# Patient Record
Sex: Male | Born: 1945 | Race: Black or African American | Hispanic: No | Marital: Married | Smoking: Former smoker
Health system: Southern US, Community
[De-identification: ages and names within clinical notes are randomized; demographics above are authoritative.]

## PROBLEM LIST (undated history)

## (undated) DIAGNOSIS — T8859XA Other complications of anesthesia, initial encounter: Secondary | ICD-10-CM

## (undated) DIAGNOSIS — I219 Acute myocardial infarction, unspecified: Secondary | ICD-10-CM

## (undated) DIAGNOSIS — E119 Type 2 diabetes mellitus without complications: Secondary | ICD-10-CM

## (undated) DIAGNOSIS — C61 Malignant neoplasm of prostate: Secondary | ICD-10-CM

## (undated) DIAGNOSIS — T4145XA Adverse effect of unspecified anesthetic, initial encounter: Secondary | ICD-10-CM

## (undated) DIAGNOSIS — I639 Cerebral infarction, unspecified: Secondary | ICD-10-CM

## (undated) DIAGNOSIS — E785 Hyperlipidemia, unspecified: Secondary | ICD-10-CM

## (undated) DIAGNOSIS — I10 Essential (primary) hypertension: Secondary | ICD-10-CM

## (undated) DIAGNOSIS — I200 Unstable angina: Secondary | ICD-10-CM

## (undated) DIAGNOSIS — B029 Zoster without complications: Secondary | ICD-10-CM

## (undated) DIAGNOSIS — I169 Hypertensive crisis, unspecified: Secondary | ICD-10-CM

## (undated) DIAGNOSIS — M4800 Spinal stenosis, site unspecified: Secondary | ICD-10-CM

## (undated) DIAGNOSIS — I2510 Atherosclerotic heart disease of native coronary artery without angina pectoris: Secondary | ICD-10-CM

## (undated) DIAGNOSIS — M1990 Unspecified osteoarthritis, unspecified site: Secondary | ICD-10-CM

## (undated) DIAGNOSIS — IMO0002 Reserved for concepts with insufficient information to code with codable children: Secondary | ICD-10-CM

## (undated) HISTORY — PX: CARDIAC SURGERY: SHX584

## (undated) HISTORY — DX: Spinal stenosis, site unspecified: M48.00

## (undated) HISTORY — DX: Hyperlipidemia, unspecified: E78.5

## (undated) HISTORY — DX: Reserved for concepts with insufficient information to code with codable children: IMO0002

## (undated) HISTORY — DX: Malignant neoplasm of prostate: C61

---

## 2000-11-20 ENCOUNTER — Encounter (INDEPENDENT_AMBULATORY_CARE_PROVIDER_SITE_OTHER): Payer: Self-pay | Admitting: Specialist

## 2000-11-20 ENCOUNTER — Ambulatory Visit (HOSPITAL_COMMUNITY): Admission: RE | Admit: 2000-11-20 | Discharge: 2000-11-20 | Payer: Self-pay | Admitting: Gastroenterology

## 2003-10-15 ENCOUNTER — Emergency Department (HOSPITAL_COMMUNITY): Admission: EM | Admit: 2003-10-15 | Discharge: 2003-10-15 | Payer: Self-pay | Admitting: Emergency Medicine

## 2006-07-10 ENCOUNTER — Emergency Department (HOSPITAL_COMMUNITY): Admission: EM | Admit: 2006-07-10 | Discharge: 2006-07-10 | Payer: Self-pay | Admitting: Family Medicine

## 2006-08-09 ENCOUNTER — Emergency Department (HOSPITAL_COMMUNITY): Admission: EM | Admit: 2006-08-09 | Discharge: 2006-08-09 | Payer: Self-pay | Admitting: Family Medicine

## 2007-09-11 HISTORY — PX: CORONARY ARTERY BYPASS GRAFT: SHX141

## 2008-05-16 ENCOUNTER — Inpatient Hospital Stay (HOSPITAL_COMMUNITY): Admission: EM | Admit: 2008-05-16 | Discharge: 2008-05-24 | Payer: Self-pay | Admitting: Emergency Medicine

## 2008-05-18 ENCOUNTER — Encounter: Payer: Self-pay | Admitting: Thoracic Surgery (Cardiothoracic Vascular Surgery)

## 2008-05-19 ENCOUNTER — Encounter: Payer: Self-pay | Admitting: Thoracic Surgery (Cardiothoracic Vascular Surgery)

## 2008-05-20 ENCOUNTER — Ambulatory Visit: Payer: Self-pay | Admitting: Thoracic Surgery (Cardiothoracic Vascular Surgery)

## 2008-05-31 ENCOUNTER — Ambulatory Visit: Payer: Self-pay | Admitting: Thoracic Surgery (Cardiothoracic Vascular Surgery)

## 2008-06-14 ENCOUNTER — Encounter
Admission: RE | Admit: 2008-06-14 | Discharge: 2008-06-14 | Payer: Self-pay | Admitting: Thoracic Surgery (Cardiothoracic Vascular Surgery)

## 2008-06-14 ENCOUNTER — Ambulatory Visit: Payer: Self-pay | Admitting: Thoracic Surgery (Cardiothoracic Vascular Surgery)

## 2008-09-08 HISTORY — PX: OTHER SURGICAL HISTORY: SHX169

## 2009-08-09 IMAGING — CR DG CHEST 1V PORT
1 series · 1 of 1 positions shown · non-contrast
Comparison: 05/15/2008

CLINICAL DATA: Coronary bypass grafting

PORTABLE CHEST - 1 VIEW

[AP]
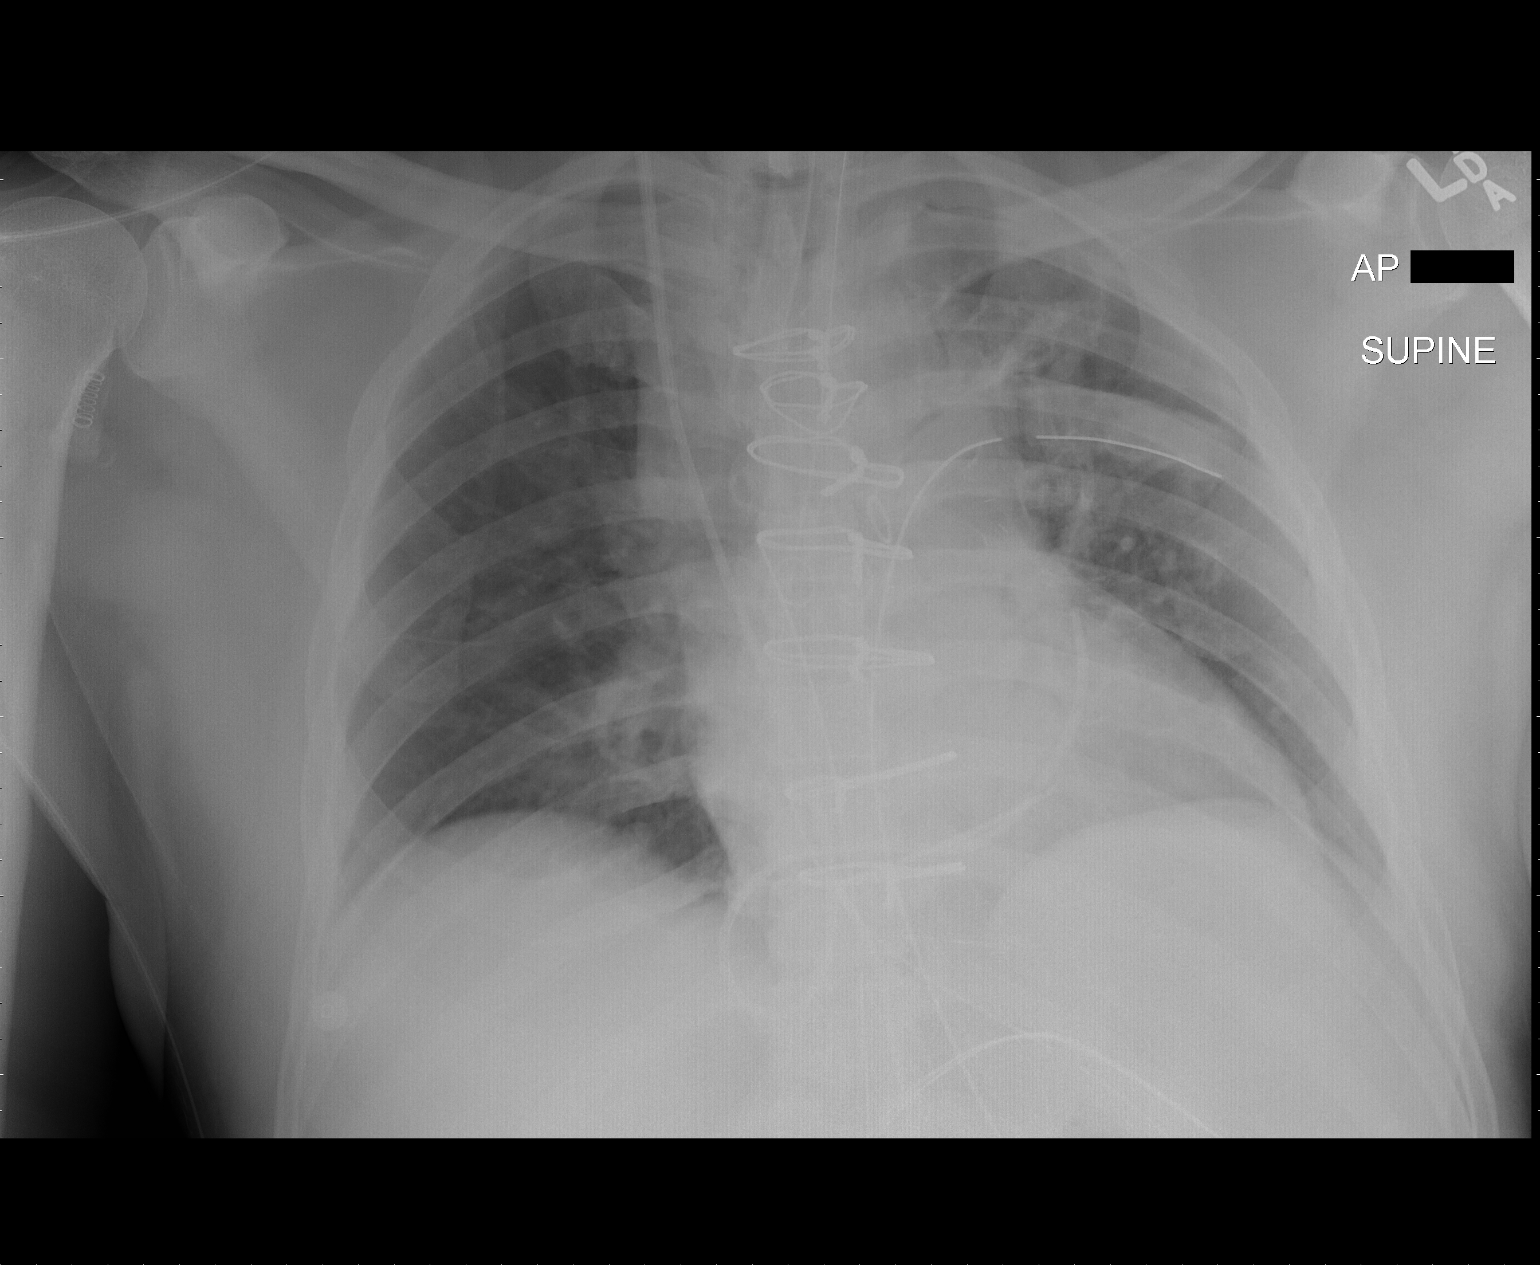

[1 of 1 positions shown; findings below may reference images not displayed]

FINDINGS: Changes of interval CABG.  Endotracheal tube tip
approximately 3 cm above carina.  Nasogastric tube extends at least
as far as the stomach.  Left chest tube with no pneumothorax.
Mediastinal drain is noted.  Right IJ central line to the
cavoatrial junction.  Low lung volumes with mild interstitial edema
or infiltrates and central pulmonary vascular congestion.  Patchy
areas of atelectasis or infiltrate in the perihilar regions, left
greater than right.  No definite effusion.
IMPRESSION: 1.  Recent CABG with support hardware projecting expected location.
2.  No pneumothorax.
3.  Mild asymmetric perihilar infiltrates or edema, left greater
than right.

## 2009-08-10 IMAGING — CR DG CHEST 1V PORT
1 series · 1 of 1 positions shown · non-contrast
Comparison: 05/20/2008

CLINICAL DATA: CABG.

PORTABLE CHEST - 1 VIEW

[AP]
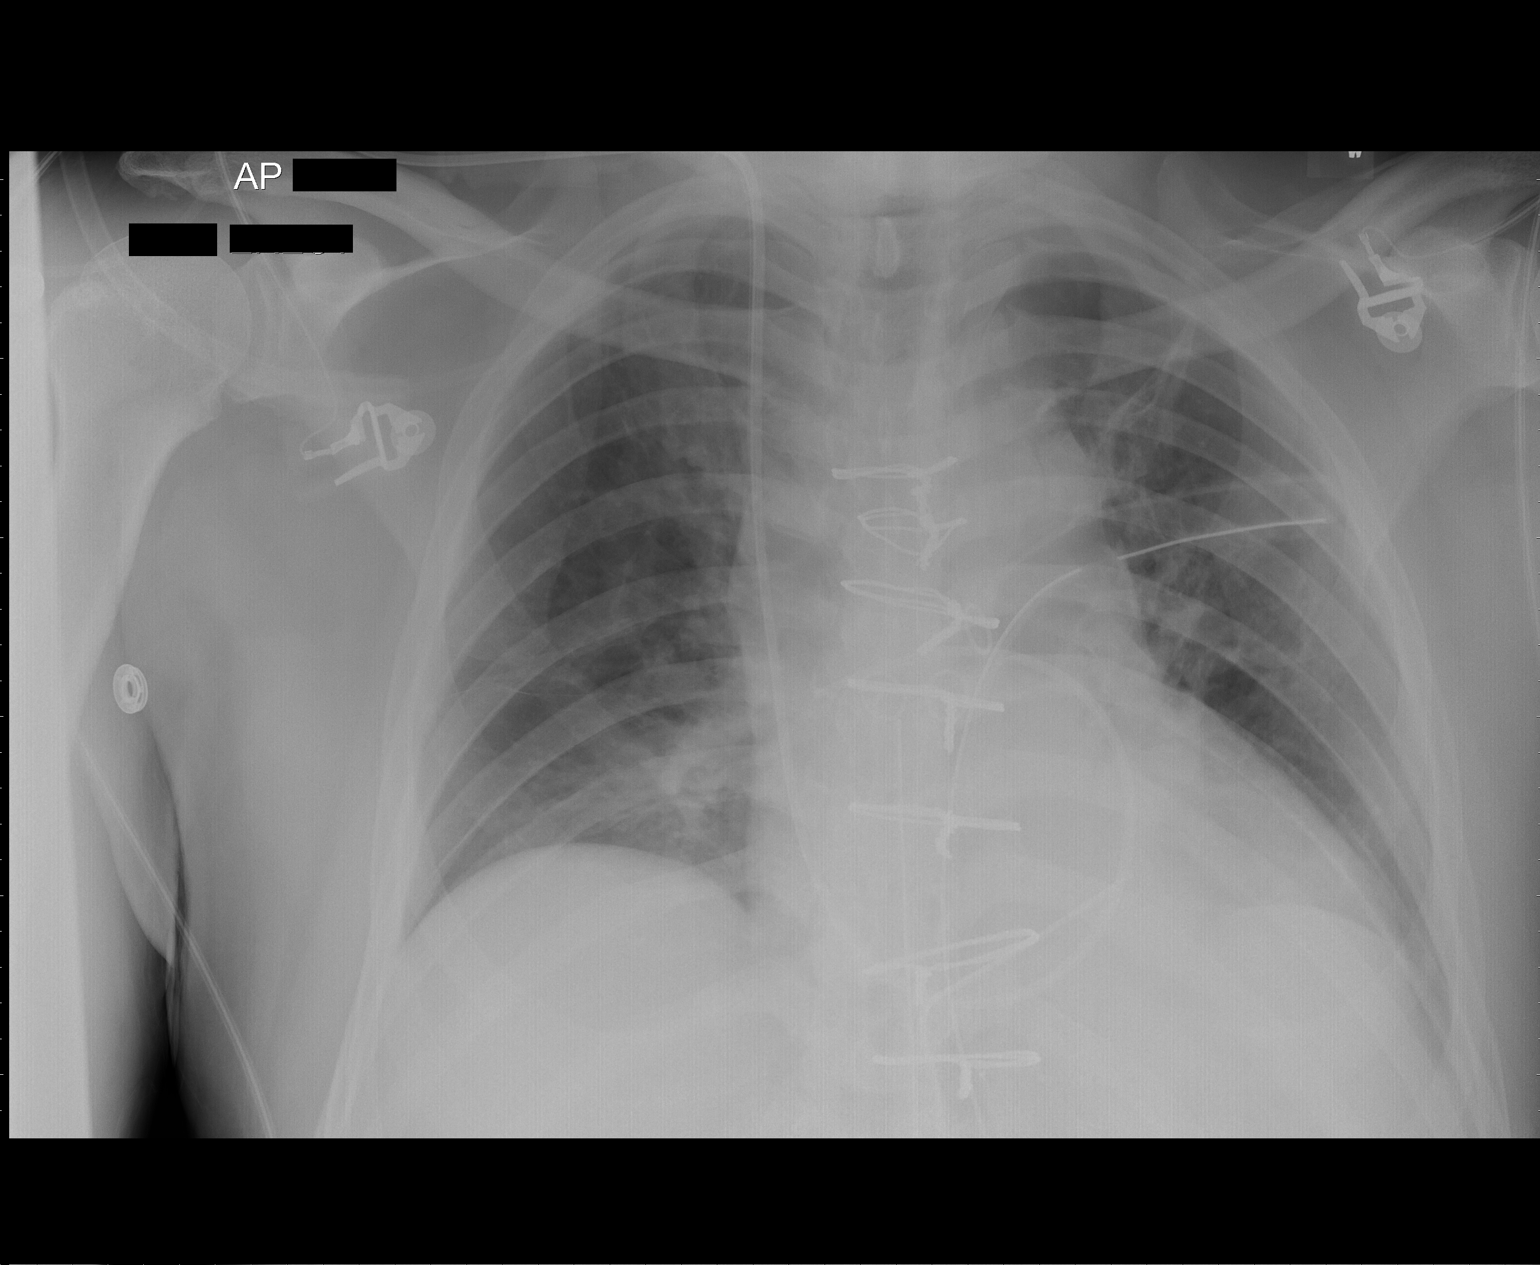

[1 of 1 positions shown; findings below may reference images not displayed]

FINDINGS: Swan-Ganz catheter has been advanced into the main right
pulmonary artery.  Interval extubation and removal of NG tube.
Left chest tube unchanged.  No pneumothorax.  Left upper lobe and
bibasilar atelectasis noted.  The bibasilar atelectasis has
increased slightly since prior study.  Mild cardiomegaly with
vascular congestion.
IMPRESSION: Increasing bibasilar atelectasis post extubation.  Stable left
upper lobe atelectasis.

Cardiomegaly, vascular congestion.

## 2009-08-11 IMAGING — CR DG CHEST 2V
2 series · 2 of 2 positions shown · non-contrast
Comparison: 05/21/2008

CLINICAL DATA: Followup CABG procedure

CHEST - 2 VIEW

[w chest pa]
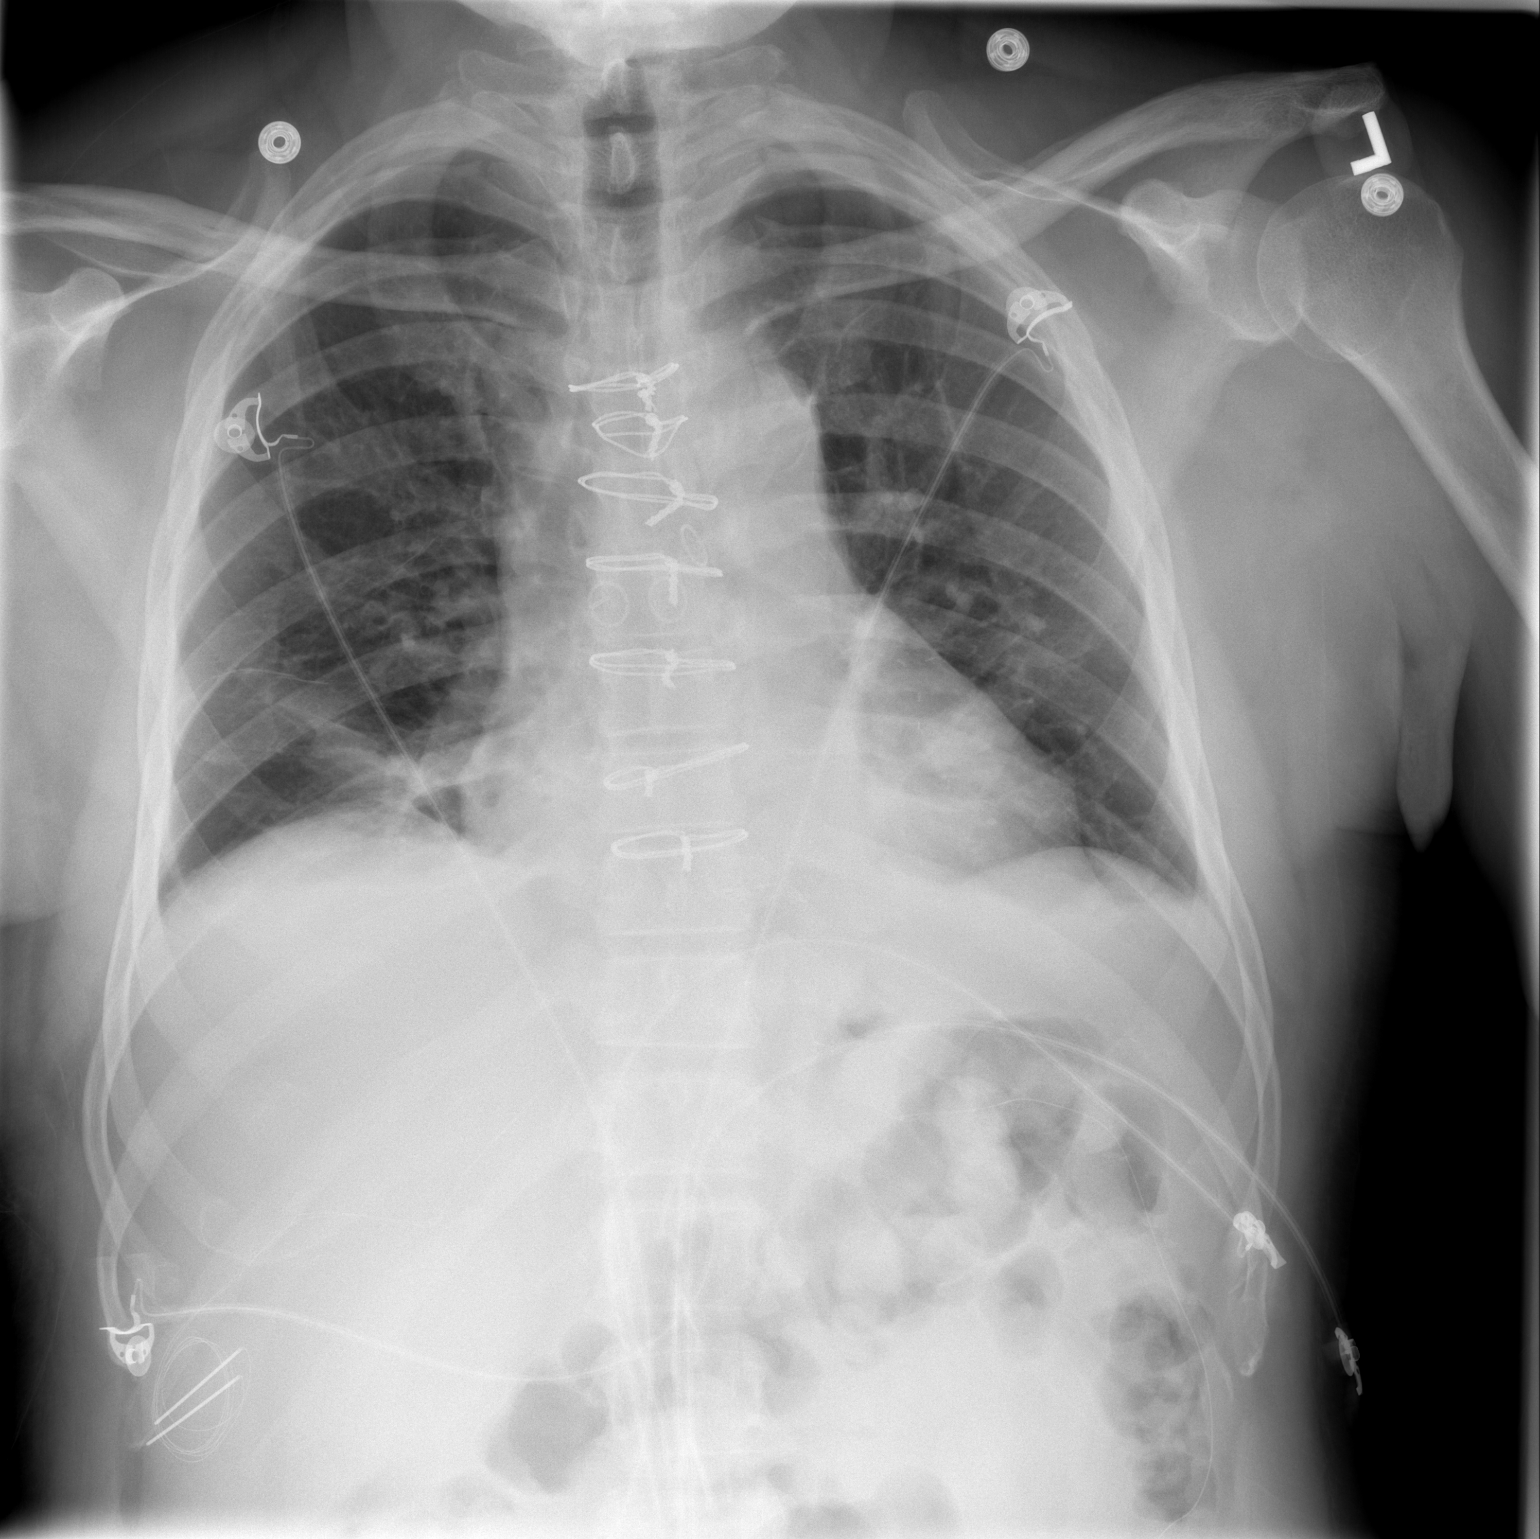

[w chest lat]
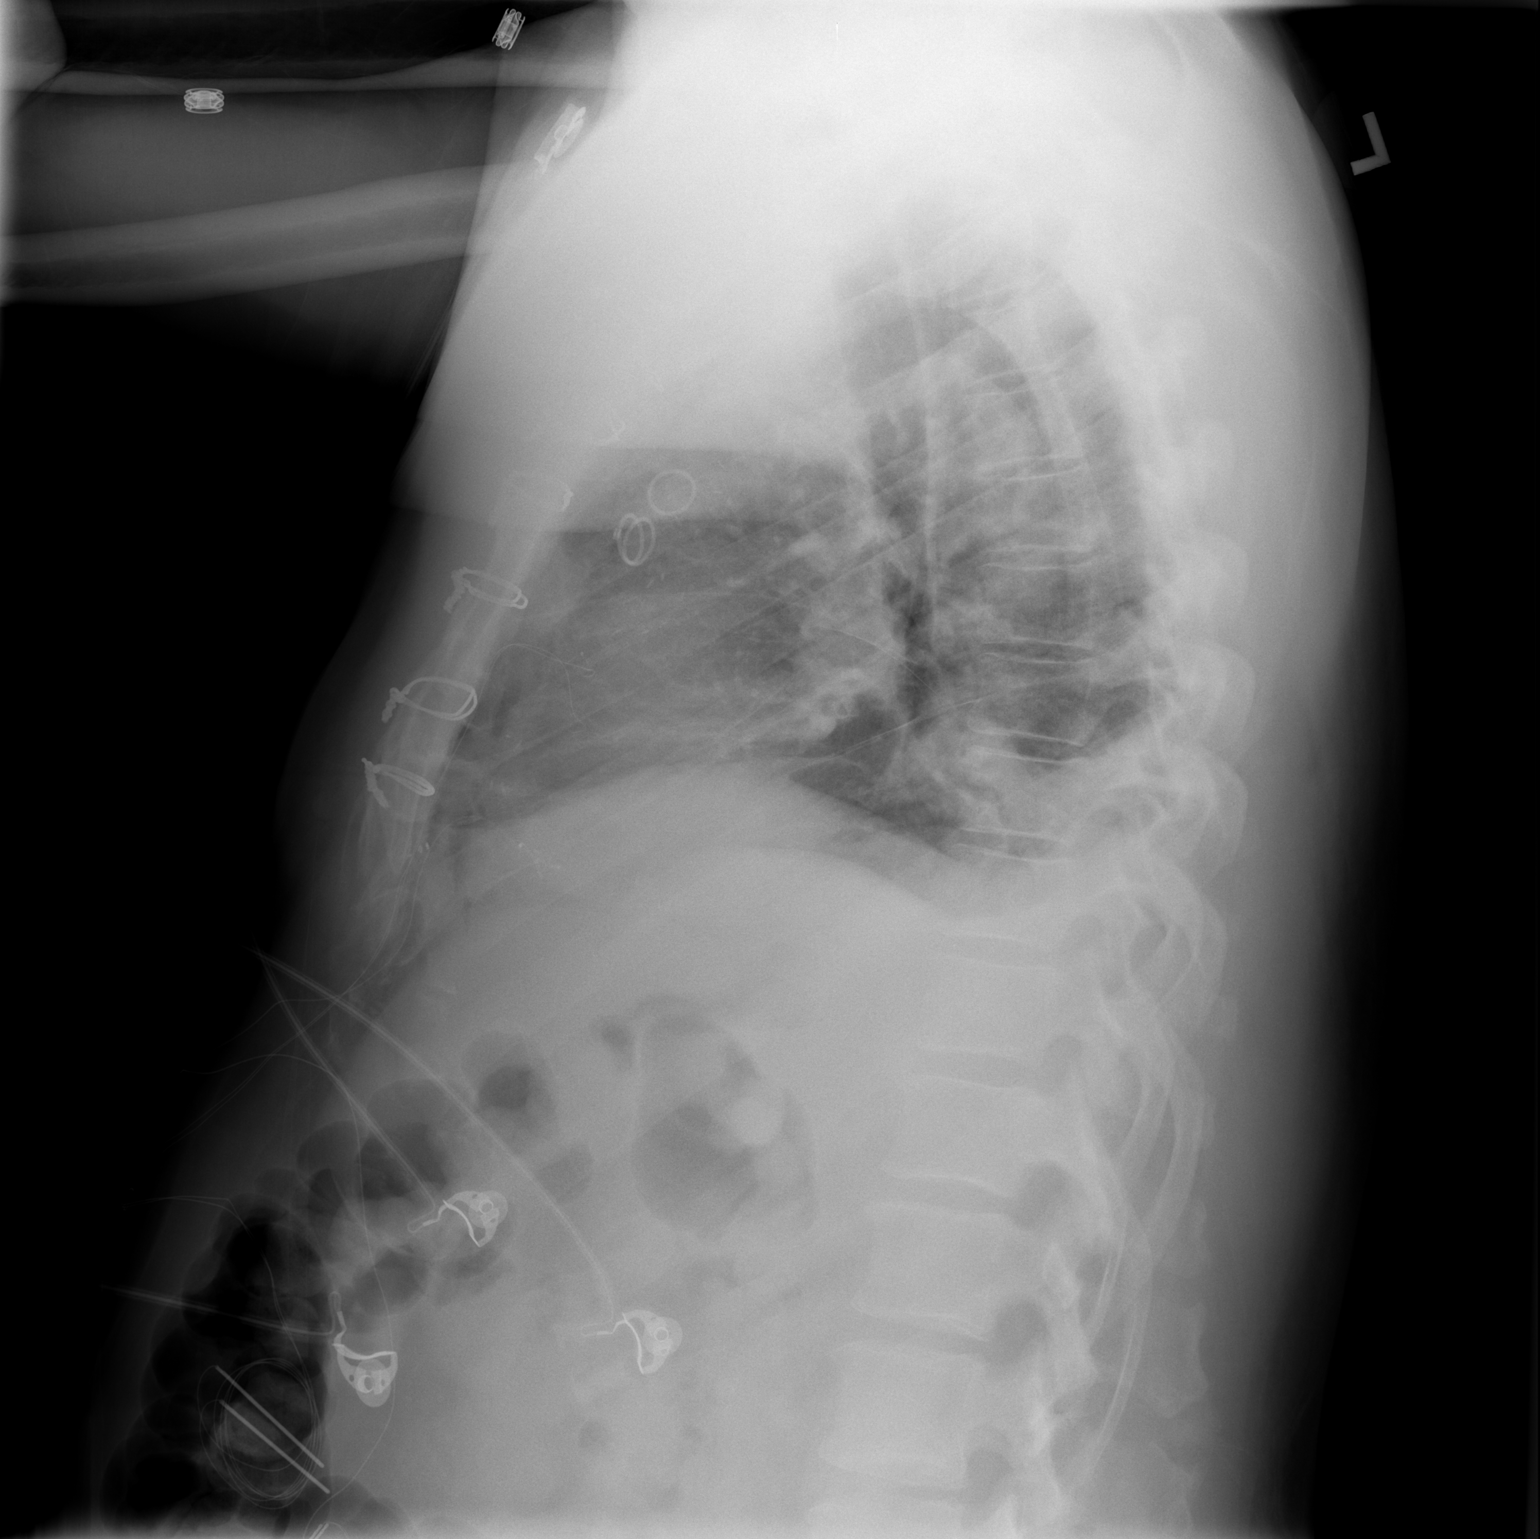

[2 of 2 positions shown; findings below may reference images not displayed]

FINDINGS: Swan-Ganz catheter, mediastinal drains and left chest
tube have all been removed in the interval.  Negative for
pneumothorax.  Bibasilar subsegmental atelectasis.  Negative for
edema.  Cardiomediastinal contours are unchanged.
IMPRESSION: 1.  Interval removal Swan-Ganz catheter, chest tube and mediastinal
drains.  Negative for pneumothorax.
2.  Bibasilar subsegmental atelectasis.

## 2011-01-23 NOTE — Cardiovascular Report (Signed)
[name], [name] NO.:  [id number]   MEDICAL RECORD NO.:  [id number]          PATIENT TYPE:  INP   LOCATION:  2017                         FACILITY:  [facility]   PHYSICIAN:  Shelva Majestic, M.D.     DATE OF BIRTH:  29-Nov-1945   DATE OF PROCEDURE:  05/18/2008  DATE OF DISCHARGE:  05/24/2008                            CARDIAC CATHETERIZATION   INDICATIONS:  [name] is a 65 year old African American male  who has a history of hypertension, hyperlipidemia, type 2 diabetes  mellitus, and family history of coronary artery disease.  He presented  to [facility] on May 15, 2008, with chest pain.  He had ST-  T changes suggestive of acute coronary syndrome.  Urgent cardiac  catheterization was performed which demonstrated subtotal/total diagonal  occlusion, but he had significant thrombus burden in the proximal LAD as  well as thrombus burden in the proximal circumflex.  In addition, he did  have diffuse 80% stenosis in the second diagonal portion of the LAD, 70%  and 50% mid-LAD stenoses, 95% distal LAD stenosis, as well as high-grade  proximal 95% stenosis.  The patient has been maintained on Integrilin  and heparin and in addition nitroglycerin since presentation on early  morning of May 16, 2008.  He is now brought back to the  catheterization laboratory for re-look to if there is significant  resolution of thrombus burden with plans for possible percutaneous  coronary prevention versus CABG revascularization surgery.   PROCEDURE:  After premedication with Valium 5 mg, the patient was  prepped and draped in usual fashion.  Right femoral artery was punctured  anteriorly and a 6-French sheath was inserted.  Diagnostic  catheterization was done utilizing Judkins for left and right coronary  catheters.  A 5-French pigtail catheter was used for biplane left  ventriculography.  Hemostasis was obtained by direct manual pressure.  The patient  tolerated the procedure well.   HEMODYNAMIC DATA:  Central aortic pressure is 122/82.  Left ventricular  pressure 122/14.   ANGIOGRAPHIC DATA:  Left main coronary was angiographically normal and  bifurcated into the LAD and left circumflex system.   The LAD had evidence for proximal 50-60% narrowing before the takeoff of  the first diagonal vessel.  There was significant improvement in the  previous large clot burden in this segment.  The diagonal vessel now  appeared totally occluded proximally with very faint antegrade  collateralization.  There was diffuse 70% and 50% LAD stenoses before  the second diagonal vessel, diffuse 80% stenosis within the second  diagonal vessel, and 50% stenosis in the mid LAD after the second  diagonal vessel.  The apical LAD had TIMI III flow and had 90% stenosis.   The circumflex vessel gave rise to one major marginal vessel and there  was still thrombus burden in the proximal segment.   The right coronary artery had 95% proximal stenosis with diffuse 20-30%  narrowings in the mid distal segment.   Biplane left ventriculography revealed ejection fraction of 55%.  There  was only mild residual distal inferior hypocontractility in the RAO  projection  and low posterolateral hypocontractility on the LAO  projection.   IMPRESSION:  1. Preserved global contractility with mild residual inferoapical to      low posterolateral hypocontractility.  2. Multivessel coronary obstructive disease with evidence for 50-60%      proximal left anterior descending stenosis, total occlusion of the      first diagonal vessel, diffuse 70% and 50% mid left anterior      descending stenosis of 90% apical left anterior descending stenosis      and diffuse 80% stenosis in the second diagonal vessel; clot burden      in the proximal circumflex coronary artery; 95% proximal left      anterior descending stenosis with mid distal 20-30% narrowing.   DISCUSSION:  [name]  presented with acute coronary syndrome on the  evening of May 15, 2008.  He was taken to the catheterization  laboratory where acute catheterization reveals significant thrombus  burden in proximal LAD and circumflex.  At that time, his ST-T changes  were most likely due to his high-grade subtotal diagonal vessel.  He has  been maintained on Integrilin and heparin in addition to nitrate therapy  since presentation.  Catheterization study continues now shows some  improvement in the previous clot burden but still this is significant in  the circumflex territory.  With his age of 69 years, diabetes mellitus,  and diffuse coronary artery disease, coronary artery bypass graft  revascularization surgery is recommended.           ______________________________  Shelva Majestic, M.D.     TK/MEDQ  D:  07/12/2008  T:  07/13/2008  Job:  [id number]   cc:   Judeth Cornfield. Scot Dock, M.D.

## 2011-01-23 NOTE — Op Note (Signed)
[name], [name]             ACCOUNT NO.:  [id number]   MEDICAL RECORD NO.:  [id number]          PATIENT TYPE:  INP   LOCATION:  2304                         FACILITY:  [facility]   PHYSICIAN:  Valentina Gu. Roxy Manns, M.D. DATE OF BIRTH:  1946-09-03   DATE OF PROCEDURE:  05/20/2008  DATE OF DISCHARGE:                               OPERATIVE REPORT   PREOPERATIVE DIAGNOSIS:  Severe three-vessel coronary artery disease.   POSTOPERATIVE DIAGNOSIS:  Severe three-vessel coronary artery disease.   PROCEDURE:  Median sternotomy for coronary artery bypass grafting x4  (left internal mammary artery to distal left anterior descending  coronary artery, saphenous vein graft to second diagonal branch,  saphenous vein graft to circumflex marginal branch, saphenous vein graft  to distal right coronary artery, endoscopic saphenous vein harvest from  right thigh and right lower leg).   SURGEON:  Valentina Gu. Roxy Manns, MD   ASSISTANT:  Lars Pinks, PA   ANESTHESIA:  General.   BRIEF CLINICAL NOTE:  The patient is a 65 year old male with no previous  history of coronary artery disease, but risk factors notable for history  of hypertension, type 2 diabetes mellitus, and hyperlipidemia.  The  patient presents with acute coronary syndrome and was admitted to the  hospital on May 16, 2008.  The patient ruled in for an acute non-ST  segment elevation myocardial infarction based on serial cardiac enzymes.  Cardiac catheterization performed by Dr. Ellouise Newer demonstrates severe  three-vessel coronary artery disease with preserved left ventricular  function.  A full consultation note has been dictated previously.  The  patient and his family have been counseled at length regarding the  indications, risks, and potential benefits of surgery.  They provide  consent for the procedure as described.   OPERATIVE FINDINGS:  1. Normal left ventricular systolic function.  2. Mild left ventricular  hypertrophy.  3. Diffuse coronary artery disease.  4. Small caliber left internal mammary artery with good flow.  5. Good-quality saphenous vein conduit for grafting.   OPERATIVE NOTE IN DETAIL:  The patient is brought to the operating room  on the above-mentioned date and central monitoring was established by  the anesthesia service under the care and direction of Dr. Annye Asa.  Specifically, a Swan-Ganz catheter is placed through the right  internal jugular approach.  A radial arterial line is placed.  Intravenous antibiotics are administered.  Following induction with  general endotracheal anesthesia, a Foley catheter is placed.  The  patient's chest, abdomen, both groins, and both lower extremities are  prepared and draped in sterile manner.  Baseline transesophageal  echocardiogram is performed by Dr. Glennon Mac.  This demonstrates normal  left ventricular function.  No other significant abnormalities are  noted.   A median sternotomy incision is performed and the left internal mammary  artery is dissected from the chest wall and prepared for bypass  grafting.  The left internal mammary artery is somewhat small caliber,  but otherwise good-quality conduit.  Simultaneously, saphenous vein is  obtained at the patient's right thigh and the upper portion of the right  lower leg using endoscopic  vein harvest technique.  The saphenous vein  is good-quality conduit.  After the saphenous vein has been removed from  the right lower extremity, the small incisions are closed in multiple  layers with running absorbable suture.  The patient is heparinized  systemically and left internal mammary artery is transected distally.  It is noted to have excellent flow.   The pericardium is opened.  The ascending aorta is normal in appearance.  The ascending aorta and the right atrium are cannulated for  cardiopulmonary bypass.  Adequate heparinization is verified.  Cardiopulmonary bypass is  begun and the surface of the heart is  inspected.  Distal target vessels are selected for coronary bypass  grafting.  A temperature probe is placed in the left ventricular septum  and a cardioplegic catheter is placed in the ascending aorta.   Of note, the first diagonal branch of the left anterior descending  coronary artery is too small for grafting.   The patient is allowed to cool passively to 32 degrees systemic  temperature.  The aortic cross-clamp is applied and cold blood  cardioplegia is administered in antegrade fashion through the aortic  root.  Iced saline slush is applied for topical hypothermia.  The  initial cardioplegic arrest and myocardial cooling is felt to be  excellent.  Repeat doses of cardioplegia are administered intermittently  throughout the cross-clamp portion of the operation through the aortic  root and down the subsequently placed vein graft to maintain left  ventricular septal temperature below 15 degrees centigrade.   The following distal coronary anastomoses are performed:  1. The circumflex marginal branch is grafted with a saphenous vein      graft in end-to-side fashion.  This vessel measured 1.8-mm in      diameter and is a good-quality target vessel for grafting.  2. The distal right coronary artery is grafted with a saphenous vein      graft in end-to-side fashion.  This anastomoses is placed precisely      at the area of the bifurcation of the distal right coronary artery.      At the site of distal grafting, this vessel measured 2.0-mm in      diameter and is a good-quality target vessel.  3. The second diagonal branch of the left anterior descending coronary      artery is grafted with a saphenous vein graft in end-to-side      fashion.  This vessel measured 1.2-mm in diameter and is a fair-      quality target vessel for grafting.  It is diffusely diseased      proximally.  4. The distal left anterior descending coronary artery is grafted  with      left internal mammary artery in end-to-side fashion.  This vessel      was diffusely diseased and there is high-grade stenosis well beyond      the distal anastomoses close to the apex of the heart.  However,      the site of distal grafting.  This vessel measures 2.0-mm in      diameter and is a good-quality target vessel.   All three proximal saphenous vein anastomoses are performed directly to  the ascending aorta prior to removal of the aortic cross-clamp.  In the  left ventricular, septal temperature rises rapidly with reperfusion of  the left internal mammary artery.  The aortic cross-clamp is removed  after total cross-clamp time of 76 minutes.  The heart is defibrillated  and normal sinus rhythm resumes spontaneously.  All proximal and distal  coronary anastomoses are inspected for hemostasis and appropriate graft  orientation.  The epicardial pacing wires are fixed to the right  ventricular free wall into the right atrial appendage.  The patient is  rewarmed to 37 degrees centigrade temperature.  The patient is weaned  from cardiopulmonary bypass without difficulty.  The patient rhythm at  separation from bypass is normal sinus rhythm.  No inotropic support is  required.  Total cardiopulmonary bypass time for the operation is 92  minutes.  Followup transesophageal echocardiogram performed by Dr.  Glennon Mac after separation from bypass demonstrates normal left  ventricular function with no wall motion abnormalities.   The venous and arterial cannulae are removed uneventfully.  Protamine is  administered to reverse the anticoagulation.  The mediastinum and the  left chest are irrigated with saline solution containing vancomycin.  Meticulous surgical hemostasis is ascertained.  The mediastinum and the  left chest are drained with three chest tubes exited through separate  stab incisions inferiorly.  The pericardium and soft tissues anterior to  the aorta are reapproximated  loosely.  The sternum is closed with double-  strength sternal wire.  The soft tissues anterior to the sternum are  closed in multiple layers and the skin is closed with running  subcuticular skin closure.   The patient tolerated the procedure well and is transported to the  Surgical Intensive Care Unit in stable condition.  There are no  intraoperative complications.  All sponge, instrument, and needle counts  are verified correct at completion of the operation.  No blood products  are administered.      Valentina Gu. Roxy Manns, M.D.  Electronically Signed     CHO/MEDQ  D:  05/20/2008  T:  05/20/2008  Job:  [id number]   cc:   Shelva Majestic, M.D.  Dr. Karis Juba

## 2011-01-23 NOTE — Consult Note (Signed)
NAMETIMONTHY, DEMARY             ACCOUNT NO.:  [id number]   MEDICAL RECORD NO.:  [id number]          PATIENT TYPE:  INP   LOCATION:  2399                         FACILITY:  [facility]   PHYSICIAN:  Valentina Gu. Roxy Manns, M.D. DATE OF BIRTH:  Mar 26, 1946   DATE OF CONSULTATION:  05/18/2008  DATE OF DISCHARGE:                                 CONSULTATION   REASON FOR CONSULTATION:  Severe 3-vessel coronary artery disease.   REQUESTING PHYSICIAN:  Shelva Majestic, MD   HISTORY OF PRESENT ILLNESS:  Mr. Ladewig is a 65 year old African  American male from [place] with no previous history of coronary  artery disease, but risk factors notable for history of hypertension,  type 2 diabetes mellitus, and hyperlipidemia.  The patient states that 2  weeks ago he developed a brief episode of substernal chest pressure that  occurred while he was walking up a hill.  Pain resolved spontaneously.  He was, otherwise, in his usual state of health until May 15, 2008,  when he developed sudden onset of severe substernal chest pressure with  radiation to the right arm.  There was no associated shortness of  breath, diaphoresis, nausea.  The pain persisted prompting him to  present to the emergency room.  Baseline electrocardiogram revealed  nonspecific ST changes without significant ST-segment elevation but some  noticeable changes in comparison to previous exams.  The patient was  admitted to the hospital and chest pain resolved with medical treatment  for presumed unstable angina.  The patient did rule in for a non-ST-  segment elevation myocardial infarction based on serial cardiac enzymes  with a peak total CK of 287 and a CK-MB of 12.2.  The patient underwent  cardiac catheterization on May 16, 2008, at 1 in the morning.  This  revealed severe 3-vessel coronary artery disease with mild left  ventricular dysfunction, ejection fraction estimated 50-55%.  The  patient was brought back for repeat  cardiac catheterization 2 days later  confirming similar findings now with occlusion of a high diagonal  branch.  Cardiothoracic surgical consultation was requested for surgical  revascularization.   REVIEW OF SYSTEMS:  GENERAL:  The patient reports normal appetite.  He  has not been gaining or losing weight substantially.  CARDIAC:  The patient describes 1 episode of angina 2 weeks ago, but  otherwise, he has had no previous episodes of chest pain prior to that  which brought him to the hospital.  The patient remains active  physically and he denies any exertional shortness of breath.  He denies  resting shortness of breath, PND, orthopnea, or lower extremity edema.  He denies palpitations or syncope.  RESPIRATORY:  Negative.  The patient denies productive cough,  hemoptysis, wheezing.  GASTROINTESTINAL:  Negative.  The patient reports no difficulty  swallowing.  He denies hematochezia, hematemesis, melena.  GENITOURINARY:  Negative.  The patient reports no difficulty urinating.  MUSCULOSKELETAL:  Negative.  The patient denies significant arthritis or  arthralgias.  NEUROLOGIC:  Negative.  The patient denies symptoms suggestive of  previous TIA or stroke.  HEENT:  Negative.  ENDOCRINE:  Notable  for type 2 diabetes mellitus.  The patient checks  his blood sugars periodically at home.   PAST MEDICAL HISTORY:  1. Hypertension.  2. Type 2 diabetes mellitus.  3. Hyperlipidemia.   PAST SURGICAL HISTORY:  None.   FAMILY HISTORY:  Notable for the patient's father had myocardial  infarction at age 63.   SOCIAL HISTORY:  The patient is married and lives with his wife in [place].  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  He  has a remote history of some tobacco use although not much and he quit  smoking completely in 1983.  He denies excessive alcohol consumption.   MEDICATIONS PRIOR TO ADMISSION:  1. Glyburide/metformin 5/500 one tablet twice daily.  2. Coreg  3.125 mg p.o. twice daily.  3. Amlodipine 10 mg daily.  4. Vytorin 10/80 one tablet daily  5. Lisinopril 40 mg 1 tablet daily.  6. Hydrochlorothiazide 25 mg 1 tablet daily.  7. Benicar 20 mg 1 tablet daily.   DRUG ALLERGIES:  None known.   PHYSICAL EXAMINATION:  The patient is a well-appearing, mildly obese  [place] American male who appears his stated age in no acute distress.  HEENT exam is unrevealing.  He is in normal sinus rhythm.  There is no  palpable lymphadenopathy.  Auscultation of the chest demonstrates clear  breath sounds, which are symmetrical bilaterally.  No wheezes or rhonchi  demonstrated.  Cardiovascular exam includes regular rate and rhythm.  No  murmurs, rubs, or gallops noted.  The abdomen is soft, nondistended,  nontender.  Bowel sounds are present.  Extremities are warm and well  perfused.  There is no lower extremity edema.  Distal pulses are  diminished in both lower legs at the ankle.  There is no sign of  significant venous insufficiency.  The skin is clean, dry, healthy  appearing throughout.  Rectal and GU exams are both deferred.   DIAGNOSTIC TEST:  Cardiac catheterization performed on May 16, 2008, is reviewed as is repeat cardiac catheterization performed on  May 18, 2008.  Both demonstrates severe 3-vessel coronary artery  disease with mild left ventricular dysfunction.  There are diffuse  changes of plaque within the vessels consistent with longstanding  diabetes.  There is ulcerated 60-70% proximal stenosis of the left  anterior descending coronary artery.  There is 100% occlusion of a high  diagonal branch, although this vessel is somewhat small and diffusely  diseased.  There is long segment 70-80% stenosis of the second diagonal  branch.  There is 90% stenosis of the distal left anterior descending  coronary artery at the apex.  There is clot in the proximal left  circumflex coronary artery with a single dominant circumflex marginal   branch.  There is 95% proximal stenosis of the right coronary artery  with diffuse 30-40% stenosis of the distal right coronary artery and  right dominant coronary circulation.  Ejection fraction is estimated at  55%.   IMPRESSION:  Severe 3-vessel coronary artery disease status post acute  non-ST-segment elevation myocardial infarction.  I believe that Mr.  Brosius would best be treated with surgical revascularization.   PLAN:  I have discussed options at length with Mr. Weatherley and his  family.  Alternative treatment strategies have been discussed.  They  understand and accept all associated risks of surgery including but not  limited to risk of death, stroke, myocardial infarction, congestive  heart failure, respiratory failure, pneumonia, bleeding requiring blood  transfusion,  arrhythmia, infection, and recurrent coronary artery  disease.  All their questions have been addressed.  Mr. Mercer has  also been counseled regarding the important nature of finding way to  bring his diabetes under  better control indefinitely in the future.  He has been advised that how  importantly this will impact that will have on the likelihood of  recurrent coronary artery disease in the future.  His hemoglobin A1c  measured during this hospitalization is notably 8.4.      Valentina Gu. Roxy Manns, M.D.  Electronically Signed     CHO/MEDQ  D:  05/19/2008  T:  05/20/2008  Job:  [id number]   cc:   Shelva Majestic, M.D.  Virgel Gess

## 2011-01-23 NOTE — Discharge Summary (Signed)
[name], [name] NO.:  [id number]   MEDICAL RECORD NO.:  [id number]          PATIENT TYPE:  INP   LOCATION:  2017                         [facility]   PHYSICIAN:  Valentina Gu. Roxy Manns, M.D. DATE OF BIRTH:  1945-11-15   DATE OF ADMISSION:  05/15/2008  DATE OF DISCHARGE:  05/24/2008                               DISCHARGE SUMMARY   ADMITTING DIAGNOSES:  1. Non-ST-elevation myocardial infarction.  2. 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  3. History of diabetes mellitus type 2.  4. History of hypertension.  5. History of hyperlipidemia.   DISCHARGE DIAGNOSES:  1. Non-ST-elevation myocardial infarction.  2. 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  3. History of diabetes mellitus type 2.  4. History of hypertension.  5. History of hyperlipidemia.   PROCEDURES:  1. Emergent cardiac catheterization done, May 16, 2008.  2. Repeat cardiac catheterization done, May 18, 2008.  3. Coronary artery bypass grafting x4 (left internal mammary artery to      left anterior descending, saphenous vein graft to diagonal branch      and saphenous vein graft to circumflex marginal branch, saphenous      vein graft to distal right coronary artery with endoscopic vein      harvesting from the right lower extremity by Dr. Roxy Manns on May 20, 2008).   HISTORY OF PRESENT ILLNESS:  This is a 65 year old African American male  with a prior medical history of diabetes mellitus type 2, hypertension,  and hyperlipidemia who according to the medical records developed a  brief episode of substernal chest pressure that occurred while he was  walking up a hill.  This pain resolved spontaneously.  Then again on  May 15, 2008, he developed sudden onset of severe substernal chest  pain with radiation to the right arm.  The patient denied any associated  shortness of breath, diaphoresis, nauseousness, or vomiting.   He  presented to [facility] Emergency Room.  Baseline EKG showed nonspecific  ST changes without significant ST-segment elevation, but some noticeable  changes compared to previous EKGs.  Further workup was undertaken.  He  was found to have a non-STEMI (cardiac enzymes revealed a CK 287 and CK-  MB of 12.2).  The patient then underwent cardiac catheterization on  May 16, 2008.  This revealed severe 3-vessel coronary artery  disease with left ventricular function at 50-55%.  The patient underwent  a repeat cardiac catheterization on May 18, 2008 with similar  findings in addition to a new occlusion of the high diagonal branch.  Both cardiac catheterizations revealed an ulcerated 60-70% proximal  stenosis of the left LAD, 100% occlusion of the high diagonal branch (on  the second cardiac catheterization), 70-80% stenosis of the second  diagonal branch, 90% stenosis of the distal LAD, a clot in the proximal  left circumflex, and 95% proximal stenosis of the right coronary artery  with diffuse 30-40% stenosis of the distal RCA.  The patient was  originally placed on a heparin drip, then on Integrilin.  Integrilin  drip was discontinued after the  second cardiac catheterization.  Heparin  was continued and stopped early the morning of surgery.  Duplex cardiac  ultrasound revealed no significant internal carotid artery stenosis  bilaterally.  The patient underwent the aforementioned coronary bypass  grafting surgery x4 by Dr. Roxy Manns on May 20, 2008.   HOSPITAL COURSE STAY:  The patient was extubated early evening the day  of surgery.  He remained afebrile and hemodynamically stable.  All chest  tubes were removed by May 21, 2008.  Followup chest x-ray revealed  no pneumothorax and bibasilar subsegmental atelectasis.  The patient was  transferred from the ICU to 2000 for further convalescence.  Beta-  blocker was started.  It was increased because of tachycardia.  He was   also on lisinopril preoperatively.  This was restarted at a lower dose.  He had good glucose control with insulin.  His home regimen of glyburide  and metformin had not in part because the creatinine was up to 1.24.  He  was diuresed.  He was doing well with cardiac rehab and he continued to  improve such that by postoperative day #4, he was afebrile.   PHYSICAL EXAMINATION:  VITAL SIGNS:  BP was 121/83, heart rate 90s, O2  sat 94-96% on room air, preop weight 97 kg, and today's weight was 90.4  kg.  CARDIOVASCULAR:  Rate and rhythm.  PULMONARY:  Slightly diminished at bases, but clear.  ABDOMEN:  Soft, nontender.  Bowel sounds present.  EXTREMITIES:  Trace edema, right greater than left.  The wound was clean  and dry.   The patient was discharged on this date.   LABORATORY DATA:  Last CBC done May 24, 2008 revealed the H&H to  be 10.9 and 32.2 respectively, white count 1600, and platelet count  291,000.  Last BMET done on May 23, 2008 revealed the potassium to  be 3.8, sodium 141, BUN and creatinine 14 and 1.19 respectively.  Last  chest x-ray was done on May 22, 2008, which again revealed no  pneumothorax, bibasilar subsegmental atelectasis, and negative for  edema.   DISCHARGE INSTRUCTIONS:  Include the following:  The patient is to be on  a carbohydrate modified medium caloric diet.  He is not to drive.  He is  not to lift more than 10 pounds.  He is to continue with his breathing  exercise daily.  He is to walk every day and increase the frequency and  duration as tolerated.  He was instructed he may shower and cleanse his  wounds with soap and water.   DISCHARGE MEDICATIONS:  1. Glyburide/metformin 2.5/500 mg p.o. 2 times daily.  2. Vytorin 10/80 mg p.o. daily.  3. Lopressor 50 mg p.o. 2 times day.  4. Lisinopril 10 mg p.o. 2 times daily.  5. EC ASA 81 mg p.o. daily.  6. Plavix 75 mg p.o. daily.  7. Oxycodone 5 mg 1-2 tablets p.o. q.4-6 h. as needed for  pain.   FOLLOWUP APPOINTMENTS:  1. The patient needs to contact Dr. Doren Custard for further diabetes      management (hemoglobin A1c preop of 8.4).  2. He has an appointment to have his staples removed on May 31, 2008.  The office will contact him to      inform on the time.  In addition, they will also contact him for      followup appointment with Dr. Roxy Manns for 3 weeks.  He will have a  chest x-ray obtained prior to this appointment and he needs to      contact Dr. Evette Georges office for followup appointment in 2 weeks.      Lars Pinks, PA      Sunman H. Roxy Manns, M.D.  Electronically Signed    DZ/MEDQ  D:  05/24/2008  T:  05/25/2008  Job:  [id number]   cc:   Shelva Majestic, M.D.  Virgel Gess

## 2011-01-23 NOTE — Assessment & Plan Note (Signed)
OFFICE VISIT   [name], PREVATTE  DOB:  [birth year]                                        June 14, 2008  CHART #:  [id number]   HISTORY OF PRESENT ILLNESS:  The patient returns for routine followup  status post coronary artery bypass grafting x4 on May 20, 2008.  His postoperative recovery has been uneventful.  Following hospital  discharge, the patient has continued to improve.  He has not yet been  seen in follow up with Dr. Claiborne Billings, but he was seen at [facility] where his diabetes was checked and his oral hypoglycemic  medications have been adjusted.  Overall, he is improving nicely.  He  reports only mild residual soreness and he is now no longer taking any  pain medicine.  He has not had any shortness of breath.  His appetite is  improving.  He is sleeping well at night.  He has no complaints.   CURRENT MEDICATIONS:  1. Glyburide/metformin 2.5/500 one tablet twice daily.  2. Vytorin 10/80 one tablet daily.  3. Lopressor 50 mg twice daily.  4. Lisinopril 20 mg daily.  5. Aspirin 81 mg daily.  6. Plavix 75 mg daily.  7. Metformin 500 mg twice daily x2 weeks and 1000 mg twice daily.   PHYSICAL EXAMINATION:  GENERAL:  Notable for well-appearing male.  VITAL SIGNS:  Blood pressure 158/99, pulse 88, and oxygen saturation 98%  on room air.  CHEST:  A median sternotomy incision that is healing nicely.  The  sternum is stable on palpation.  Breath sounds are clear to auscultation  and symmetrical bilaterally.  No wheezes or rhonchi are noted.  CARDIOVASCULAR:  Regular rate and rhythm.  No murmurs, rubs, or gallops  are noted.  ABDOMEN:  Soft and nontender.  EXTREMITIES:  Warm and well perfused.  The small incisions from right  endoscopic vein harvest have healed well.  The remainder of his physical exam is unremarkable.   DIAGNOSTIC TEST:  Chest x-ray obtained at the [facility]  today is reviewed.  This  demonstrates clear lung fields bilaterally.  There are no significant pleural effusions.  All the sternal wires  appeared intact.  No other abnormalities are noted.   IMPRESSION:  The patient continues to improve uneventfully.  He looks  quite good at this point in time.  His blood pressure is slightly up in  the office today, but he is scheduled to see Dr. Claiborne Billings for further  followup later this week.   PLAN:  I have encouraged the patient to start increasing his physical  activity as tolerated with his only specific limitation at this point  remaining that he refrain from heavy lifting or strenuous use of his  arms or shoulders for at least another 2 months.  I have encouraged him  to get started the cardiac rehabilitation program.  I think, he can  start driving an automobile short distances during daylight hours and  gradually increase from there.  We have not made any changes in current  medications.  All of his questions have been addressed.  He has been  reminded how important it was made for him to keep his diabetes under  very tight control indefinitely in the future.  He will call or return  to see [place] here at [facility] only should further  problems or difficulties arise.   Valentina Gu. Roxy Manns, M.D.  Electronically Signed   CHO/MEDQ  D:  06/14/2008  T:  06/15/2008  Job:  [id number]   cc:   Shelva Majestic, M.D.  [place], [place]

## 2011-01-23 NOTE — Cardiovascular Report (Signed)
NAME:  [name], SIGNER NO.:  [id number]   MEDICAL RECORD NO.:  [id number]           PATIENT TYPE:   LOCATION:                                 FACILITY:   PHYSICIAN:  Shelva Majestic, M.D.     DATE OF BIRTH:  [birth year]   DATE OF PROCEDURE:  05/16/2008  DATE OF DISCHARGE:                            CARDIAC CATHETERIZATION   INDICATIONS:  [name] is a 65 year old African American male  who has a history of hypertension, hyperlipidemia, type 2 diabetes  mellitus, and family history for coronary artery disease.  On May 15, 2008, apparently, he presented to [facility] Emergency Room.  He was  initially seen by the ER physician.  Initial point-of-care markers were  negative.  Initial ECG did suggest subtle ST-segment changes in V4-V5.  Apparently, several hours later, the patient returned from x-ray.  A  subsequent EKG was obtained, which continues to show ST-segment changes.  Possibly, a cardiology evaluation was requested, and the patient was  seen at 23:45 hour in the emergency room.  There is concern that his ECG  suggested at least high-grade diagonal disease with ongoing chest pain  and urgent cardiac catheterization was recommended.   PROCEDURE:  After premedication with Valium 5 mg intravenously, the  patient was prepped and draped in usual fashion.  Right femoral artery  was punctured anteriorly and a 6-French sheath was inserted without  difficulty.  Coronary angiography was done with 6-French Judkins for  left and right coronary catheters.  The right catheter was also used for  selective angiography in the left internal mammary artery system.  The  pigtail catheter was used for biplane cine left ventriculography as well  as distal aortography.  With the demonstration of significant clot in  the LAD and circumflex, the decision was made to place the patient on  Integrilin bolus and infusion.  In addition, he was on IV nitroglycerin.  With  anticoagulation, 2b3a inhibition as well as nitrate therapy, his  initial TIMI2 flow improved to TIMI3 flow in the LAD system.  At this  point, it was felt that the patient should be maintained on Integrilin  for at least 48-72 hours with plans for restudy prior to final  recommendation of attempted coronary intervention versus CBG  revascularization surgery.  He tolerated the procedure well and was  admitted to the Coronary Care Unit in stable condition.   HEMODYNAMIC DATA:  Central aortic pressure is 131/91 and left  ventricular pressure 131/14.   ANGIOGRAPHIC DATA:  Left main coronary artery was angiographically  normal and bifurcated into the LAD and circumflex system.   The LAD had evidence for 40-50% narrowing proximally followed by clock.  The diagonal vessel was subtotal/totally occluded with faint filling  distally with diffuse 90% narrowing in this diagonal segment.  The LAD  beyond the first diagonal had diffuse 70% stenosis in the region of the  septal perforating artery.  There was then 80% narrowing in the diagonal  vessel, 50% narrowing in the LAD up to diagonal vessel.  The distal  apical portion of the LAD had  95% stenosis.  Initially, there was TIMI2  flow, where this improved to TIMI3 flow during the procedure.   Circumflex vessel, had evidence for clot proximally with haziness and  irregularity of at least 40-50%.  The vessel gave rise to one major  marginal vessel that had 40% distal narrowing.   The right coronary artery has shepherd crook takeoff and had 95%  proximal stenosis.  There was diffuse 20-30% narrowing in the region of  the crux prior to the PDA.   The left subclavian and internal mammary artery were normal and suitable  for potential CBG revascularization surgery if necessary.   Biplane cine left ventriculography revealed an ejection fraction of 55%.  There was mild inferior apical hypocontractility seen on the RAO  projection.  Contractility  appeared relatively normal on the LAO  projection.   Distal aortography did not demonstrate any renal artery stenosis.  There  was no significant aortoiliac disease.   IMPRESSION:  1. Acute coronary syndrome with evidence for multivessel coronary      artery disease demonstrated by 40-50% narrowing in the proximal      left anterior descending with thrombus, subtotal occlusion of the      first diagonal vessel, diffuse 70% stenosis in the mid left      anterior descending, 80% second diagonal stenosis, and 50% mid-left      anterior descending stenosis with diffuse 95% apical left      ventricular stenosis; thrombus in the proximal circumflex was 40%      narrowing; 95% proximal right coronary artery stenosis with 20-30%      distal narrowing.  2. Preserved global left ventricular contractility and ejection      fraction of 50-55% with focal area of inferior apical      hypocontractility.  3. Normal aortoiliac system.   DISCUSSION:  [name] presented with chest pain and ST-T changes  laterally most likely due to a subtotal diagonal stenosis.  Acute  catheterization reveals thrombus burden in the proximal LAD as well as  in the proximal circumflex vessel.  During the catheterization, he  became entirety painfree with 2b3a inhibition.  IV nitroglycerin with  restoration of TIMI3 flow in the apical portion of the LAD.  The patient  will be continued on anticoagulation for several days with plans for  restudy prior to final determination of CABG surgery versus percutaneous  coronary intervention.           ______________________________  Shelva Majestic, M.D.     TK/MEDQ  D:  07/12/2008  T:  07/13/2008  Job:  [id number]

## 2011-06-13 LAB — COMPREHENSIVE METABOLIC PANEL WITH GFR
ALT: 24
AST: 25
Albumin: 3.8
Alkaline Phosphatase: 85
BUN: 21
CO2: 24
Calcium: 9.8
Chloride: 101
Creatinine, Ser: 1.29
GFR calc non Af Amer: 57 — ABNORMAL LOW
Glucose, Bld: 282 — ABNORMAL HIGH
Potassium: 3.7
Sodium: 134 — ABNORMAL LOW
Total Bilirubin: 1.1
Total Protein: 7

## 2011-06-13 LAB — CBC
HCT: 26.2 — ABNORMAL LOW
HCT: 27.4 — ABNORMAL LOW
HCT: 27.9 — ABNORMAL LOW
HCT: 31.6 — ABNORMAL LOW
HCT: 32.2 — ABNORMAL LOW
HCT: 36.4 — ABNORMAL LOW
HCT: 36.7 — ABNORMAL LOW
HCT: 42.8
HCT: 47.7
Hemoglobin: 10.6 — ABNORMAL LOW
Hemoglobin: 10.9 — ABNORMAL LOW
Hemoglobin: 12.1 — ABNORMAL LOW
Hemoglobin: 12.4 — ABNORMAL LOW
Hemoglobin: 14.2
Hemoglobin: 15.9
Hemoglobin: 9.1 — ABNORMAL LOW
MCHC: 33.2
MCHC: 33.4
MCHC: 33.8
MCHC: 33.9
MCHC: 34.3
MCHC: 34.4
MCV: 80.4
MCV: 81.7
MCV: 81.9
MCV: 82.1
MCV: 82.4
MCV: 82.7
MCV: 82.7
MCV: 82.8
Platelets: 139 — ABNORMAL LOW
Platelets: 150
Platelets: 151
Platelets: 158
Platelets: 181
Platelets: 187
Platelets: 199
Platelets: 291
RBC: 3.25 — ABNORMAL LOW
RBC: 3.81 — ABNORMAL LOW
RBC: 3.91 — ABNORMAL LOW
RBC: 4.35
RBC: 4.38
RBC: 5.17
RBC: 5.81
RDW: 14.1
RDW: 14.2
RDW: 14.4
RDW: 14.5
RDW: 14.5
RDW: 14.7
WBC: 4.6
WBC: 5.7
WBC: 5.9
WBC: 6.9
WBC: 7.4
WBC: 7.9
WBC: 8.4

## 2011-06-13 LAB — BASIC METABOLIC PANEL
BUN: 10
BUN: 14
BUN: 17
BUN: 19
BUN: 24 — ABNORMAL HIGH
BUN: 9
CO2: 22
CO2: 24
CO2: 26
CO2: 26
CO2: 28
Calcium: 8.8
Calcium: 9
Calcium: 9
Calcium: 9.1
Calcium: 9.2
Chloride: 100
Chloride: 104
Chloride: 106
Creatinine, Ser: 1.05
Creatinine, Ser: 1.19
GFR calc Af Amer: 58 — ABNORMAL LOW
GFR calc Af Amer: >60
GFR calc Af Amer: >60
GFR calc Af Amer: >60
GFR calc Af Amer: >60
GFR calc non Af Amer: 48 — ABNORMAL LOW
GFR calc non Af Amer: 57 — ABNORMAL LOW
GFR calc non Af Amer: 60 — ABNORMAL LOW
GFR calc non Af Amer: >60
GFR calc non Af Amer: >60
GFR calc non Af Amer: >60
GFR calc non Af Amer: >60
Glucose, Bld: 108 — ABNORMAL HIGH
Glucose, Bld: 145 — ABNORMAL HIGH
Glucose, Bld: 171 — ABNORMAL HIGH
Glucose, Bld: 225 — ABNORMAL HIGH
Potassium: 3.3 — ABNORMAL LOW
Potassium: 3.5
Potassium: 3.6
Potassium: 3.6
Potassium: 3.7
Potassium: 3.8
Potassium: 4.2
Sodium: 134 — ABNORMAL LOW
Sodium: 138
Sodium: 141
Sodium: 141

## 2011-06-13 LAB — URINALYSIS, ROUTINE W REFLEX MICROSCOPIC
Ketones, ur: NEGATIVE
Protein, ur: NEGATIVE
Urobilinogen, UA: 1

## 2011-06-13 LAB — LIPID PANEL
HDL: 29 — ABNORMAL LOW
LDL Cholesterol: 95
Total CHOL/HDL Ratio: 6.3
Total CHOL/HDL Ratio: 8.2
Triglycerides: 287 — ABNORMAL HIGH
VLDL: 31
VLDL: 57 — ABNORMAL HIGH

## 2011-06-13 LAB — GLUCOSE, CAPILLARY
Glucose-Capillary: 128 — ABNORMAL HIGH
Glucose-Capillary: 146 — ABNORMAL HIGH
Glucose-Capillary: 157 — ABNORMAL HIGH
Glucose-Capillary: 158 — ABNORMAL HIGH
Glucose-Capillary: 161 — ABNORMAL HIGH
Glucose-Capillary: 163 — ABNORMAL HIGH
Glucose-Capillary: 172 — ABNORMAL HIGH
Glucose-Capillary: 177 — ABNORMAL HIGH
Glucose-Capillary: 178 — ABNORMAL HIGH
Glucose-Capillary: 181 — ABNORMAL HIGH
Glucose-Capillary: 199 — ABNORMAL HIGH
Glucose-Capillary: 201 — ABNORMAL HIGH
Glucose-Capillary: 201 — ABNORMAL HIGH
Glucose-Capillary: 202 — ABNORMAL HIGH
Glucose-Capillary: 224 — ABNORMAL HIGH
Glucose-Capillary: 225 — ABNORMAL HIGH
Glucose-Capillary: 226 — ABNORMAL HIGH
Glucose-Capillary: 243 — ABNORMAL HIGH
Glucose-Capillary: 268 — ABNORMAL HIGH
Glucose-Capillary: 274 — ABNORMAL HIGH
Glucose-Capillary: 289 — ABNORMAL HIGH
Glucose-Capillary: 77
Glucose-Capillary: 85
Glucose-Capillary: 89

## 2011-06-13 LAB — POCT CARDIAC MARKERS
CKMB, poc: 2
Myoglobin, poc: 91.8
Myoglobin, poc: 95.3
Troponin i, poc: <0.05

## 2011-06-13 LAB — POCT I-STAT 3, ART BLOOD GAS (G3+)
Acid-base deficit: 3 — ABNORMAL HIGH
Bicarbonate: 22.1
Bicarbonate: 25 — ABNORMAL HIGH
O2 Saturation: 100
O2 Saturation: 97
Patient temperature: 37.4
TCO2: 23
pCO2 arterial: 32.7 — ABNORMAL LOW
pH, Arterial: 7.451 — ABNORMAL HIGH
pO2, Arterial: 377 — ABNORMAL HIGH

## 2011-06-13 LAB — POCT I-STAT 3, VENOUS BLOOD GAS (G3P V)
pCO2, Ven: 34.6 — ABNORMAL LOW
pH, Ven: 7.391 — ABNORMAL HIGH
pO2, Ven: 39

## 2011-06-13 LAB — CARDIAC PANEL(CRET KIN+CKTOT+MB+TROPI)
Relative Index: 4.3 — ABNORMAL HIGH
Total CK: 266 — ABNORMAL HIGH
Troponin I: 0.7
Troponin I: 1.87

## 2011-06-13 LAB — HEMOGLOBIN A1C
Hgb A1c MFr Bld: 8.4 — ABNORMAL HIGH
Mean Plasma Glucose: 194

## 2011-06-13 LAB — MAGNESIUM: Magnesium: 2.5

## 2011-06-13 LAB — POCT I-STAT 4, (NA,K, GLUC, HGB,HCT)
Glucose, Bld: 145 — ABNORMAL HIGH
Glucose, Bld: 148 — ABNORMAL HIGH
Glucose, Bld: 152 — ABNORMAL HIGH
Glucose, Bld: 183 — ABNORMAL HIGH
HCT: 22 — ABNORMAL LOW
HCT: 25 — ABNORMAL LOW
HCT: 25 — ABNORMAL LOW
HCT: 26 — ABNORMAL LOW
HCT: 34 — ABNORMAL LOW
Hemoglobin: 11.6 — ABNORMAL LOW
Hemoglobin: 8.2 — ABNORMAL LOW
Hemoglobin: 8.5 — ABNORMAL LOW
Hemoglobin: 9.9 — ABNORMAL LOW
Potassium: 3.1 — ABNORMAL LOW
Potassium: 3.7
Potassium: 4.1
Potassium: 4.3
Potassium: 4.3
Sodium: 138
Sodium: 139

## 2011-06-13 LAB — TYPE AND SCREEN: Antibody Screen: NEGATIVE

## 2011-06-13 LAB — BLOOD GAS, ARTERIAL
Drawn by: 24487
Patient temperature: 98.6
TCO2: 25.3
pCO2 arterial: 37
pH, Arterial: 7.431

## 2011-06-13 LAB — DIFFERENTIAL
Eosinophils Absolute: 0.1
Eosinophils Relative: 1
Lymphocytes Relative: 20
Lymphs Abs: 1.7
Monocytes Relative: 7

## 2011-06-13 LAB — APTT: aPTT: 30

## 2011-06-13 LAB — TSH: TSH: 0.942

## 2011-06-13 LAB — PROTIME-INR
INR: 1.1
Prothrombin Time: 14.2

## 2011-06-13 LAB — HEPARIN LEVEL (UNFRACTIONATED)
Heparin Unfractionated: 0.3
Heparin Unfractionated: 0.4

## 2011-06-13 LAB — ABO/RH: ABO/RH(D): AB POS

## 2011-06-13 LAB — HEMOGLOBIN AND HEMATOCRIT, BLOOD: Hemoglobin: 8.1 — ABNORMAL LOW

## 2011-06-13 LAB — POCT I-STAT GLUCOSE: Operator id: 140821

## 2011-06-13 LAB — PLATELET COUNT: Platelets: 185

## 2012-03-07 ENCOUNTER — Emergency Department (HOSPITAL_COMMUNITY): Payer: Medicaid Other

## 2012-03-07 ENCOUNTER — Encounter (HOSPITAL_COMMUNITY): Payer: Self-pay

## 2012-03-07 ENCOUNTER — Encounter (HOSPITAL_COMMUNITY)
Admission: EM | Disposition: A | Discharge status: Home / Self Care | Payer: Self-pay | Source: Home / Self Care | Attending: Internal Medicine

## 2012-03-07 ENCOUNTER — Inpatient Hospital Stay (HOSPITAL_COMMUNITY)
Admission: EM | Admit: 2012-03-07 | Discharge: 2012-03-11 | DRG: 247 | Disposition: A | Discharge status: Home / Self Care | Payer: Medicaid Other | Attending: Internal Medicine | Admitting: Internal Medicine

## 2012-03-07 ENCOUNTER — Other Ambulatory Visit: Payer: Self-pay

## 2012-03-07 DIAGNOSIS — D751 Secondary polycythemia: Secondary | ICD-10-CM

## 2012-03-07 DIAGNOSIS — E119 Type 2 diabetes mellitus without complications: Secondary | ICD-10-CM | POA: Diagnosis present

## 2012-03-07 DIAGNOSIS — R739 Hyperglycemia, unspecified: Secondary | ICD-10-CM

## 2012-03-07 DIAGNOSIS — I10 Essential (primary) hypertension: Secondary | ICD-10-CM | POA: Diagnosis present

## 2012-03-07 DIAGNOSIS — I160 Hypertensive urgency: Secondary | ICD-10-CM

## 2012-03-07 DIAGNOSIS — I252 Old myocardial infarction: Secondary | ICD-10-CM

## 2012-03-07 DIAGNOSIS — I214 Non-ST elevation (NSTEMI) myocardial infarction: Principal | ICD-10-CM | POA: Diagnosis present

## 2012-03-07 DIAGNOSIS — I25810 Atherosclerosis of coronary artery bypass graft(s) without angina pectoris: Secondary | ICD-10-CM | POA: Diagnosis present

## 2012-03-07 DIAGNOSIS — I2510 Atherosclerotic heart disease of native coronary artery without angina pectoris: Secondary | ICD-10-CM | POA: Diagnosis present

## 2012-03-07 DIAGNOSIS — I200 Unstable angina: Secondary | ICD-10-CM

## 2012-03-07 DIAGNOSIS — IMO0001 Reserved for inherently not codable concepts without codable children: Secondary | ICD-10-CM

## 2012-03-07 DIAGNOSIS — N179 Acute kidney failure, unspecified: Secondary | ICD-10-CM | POA: Diagnosis present

## 2012-03-07 DIAGNOSIS — I169 Hypertensive crisis, unspecified: Secondary | ICD-10-CM

## 2012-03-07 DIAGNOSIS — I498 Other specified cardiac arrhythmias: Secondary | ICD-10-CM | POA: Diagnosis present

## 2012-03-07 DIAGNOSIS — M129 Arthropathy, unspecified: Secondary | ICD-10-CM | POA: Diagnosis present

## 2012-03-07 DIAGNOSIS — E785 Hyperlipidemia, unspecified: Secondary | ICD-10-CM | POA: Diagnosis present

## 2012-03-07 DIAGNOSIS — E1165 Type 2 diabetes mellitus with hyperglycemia: Secondary | ICD-10-CM

## 2012-03-07 DIAGNOSIS — Z955 Presence of coronary angioplasty implant and graft: Secondary | ICD-10-CM

## 2012-03-07 HISTORY — PX: PERCUTANEOUS CORONARY STENT INTERVENTION (PCI-S): SHX5485

## 2012-03-07 HISTORY — PX: CARDIAC CATHETERIZATION: SHX172

## 2012-03-07 HISTORY — DX: Atherosclerotic heart disease of native coronary artery without angina pectoris: I25.10

## 2012-03-07 HISTORY — DX: Hypertensive crisis, unspecified: I16.9

## 2012-03-07 HISTORY — DX: Essential (primary) hypertension: I10

## 2012-03-07 HISTORY — DX: Unstable angina: I20.0

## 2012-03-07 HISTORY — DX: Unspecified osteoarthritis, unspecified site: M19.90

## 2012-03-07 HISTORY — DX: Type 2 diabetes mellitus without complications: E11.9

## 2012-03-07 HISTORY — DX: Acute myocardial infarction, unspecified: I21.9

## 2012-03-07 HISTORY — PX: LEFT HEART CATHETERIZATION WITH CORONARY ANGIOGRAM: SHX5451

## 2012-03-07 LAB — CBC WITH DIFFERENTIAL/PLATELET
Eosinophils Absolute: 0.1 10*3/uL (ref 0.0–0.7)
Eosinophils Relative: 1 % (ref 0–5)
HCT: 51.3 % (ref 39.0–52.0)
Lymphocytes Relative: 22 % (ref 12–46)
Lymphs Abs: 1.8 10*3/uL (ref 0.7–4.0)
MCH: 27.9 pg (ref 26.0–34.0)
MCV: 77.7 fL — ABNORMAL LOW (ref 78.0–100.0)
Monocytes Absolute: 0.5 10*3/uL (ref 0.1–1.0)
RBC: 6.6 MIL/uL — ABNORMAL HIGH (ref 4.22–5.81)
WBC: 8.3 10*3/uL (ref 4.0–10.5)

## 2012-03-07 LAB — BASIC METABOLIC PANEL
BUN: 24 mg/dL — ABNORMAL HIGH (ref 6–23)
CO2: 20 mEq/L (ref 19–32)
Calcium: 10.1 mg/dL (ref 8.4–10.5)
Creatinine, Ser: 1.55 mg/dL — ABNORMAL HIGH (ref 0.50–1.35)
GFR calc non Af Amer: 46 mL/min — ABNORMAL LOW (ref >90)
Glucose, Bld: 352 mg/dL — ABNORMAL HIGH (ref 70–99)

## 2012-03-07 LAB — URINALYSIS, ROUTINE W REFLEX MICROSCOPIC
Bilirubin Urine: NEGATIVE
Glucose, UA: >1000 mg/dL — AB
Ketones, ur: 15 mg/dL — AB
Protein, ur: >300 mg/dL — AB

## 2012-03-07 LAB — CARDIAC PANEL(CRET KIN+CKTOT+MB+TROPI): Relative Index: 4.7 — ABNORMAL HIGH (ref 0.0–2.5)

## 2012-03-07 LAB — GLUCOSE, CAPILLARY
Glucose-Capillary: 348 mg/dL — ABNORMAL HIGH (ref 70–99)
Glucose-Capillary: 260 mg/dL — ABNORMAL HIGH (ref 70–99)

## 2012-03-07 LAB — URINE MICROSCOPIC-ADD ON

## 2012-03-07 SURGERY — LEFT HEART CATHETERIZATION WITH CORONARY ANGIOGRAM
Anesthesia: LOCAL | Laterality: Right

## 2012-03-07 MED ORDER — ATROPINE SULFATE 1 MG/ML IJ SOLN
INTRAMUSCULAR | Status: AC
  Filled 2012-03-07: qty 1

## 2012-03-07 MED ORDER — ASPIRIN 81 MG PO CHEW
81.0000 mg | CHEWABLE_TABLET | Freq: Every day | ORAL | Status: DC
  Administered 2012-03-08 – 2012-03-11 (×3): 81 mg via ORAL
  Filled 2012-03-07 (×3): qty 1
  Filled 2012-03-07: qty 4

## 2012-03-07 MED ORDER — NITROGLYCERIN IN D5W 200-5 MCG/ML-% IV SOLN
INTRAVENOUS | Status: AC
  Filled 2012-03-07: qty 250

## 2012-03-07 MED ORDER — BIVALIRUDIN 250 MG IV SOLR
INTRAVENOUS | Status: AC
  Filled 2012-03-07: qty 250

## 2012-03-07 MED ORDER — HEPARIN (PORCINE) IN NACL 100-0.45 UNIT/ML-% IJ SOLN
1450.0000 [IU]/h | INTRAMUSCULAR | Status: DC
  Administered 2012-03-08: 1300 [IU]/h via INTRAVENOUS
  Administered 2012-03-09: 1600 [IU]/h via INTRAVENOUS
  Filled 2012-03-07 (×7): qty 250

## 2012-03-07 MED ORDER — ASPIRIN 81 MG PO CHEW
324.0000 mg | CHEWABLE_TABLET | Freq: Once | ORAL | Status: AC
  Administered 2012-03-07: 324 mg via ORAL
  Filled 2012-03-07: qty 4

## 2012-03-07 MED ORDER — SODIUM CHLORIDE 0.9 % IV SOLN: 1.0000 mL/kg/h | INTRAVENOUS | Status: AC

## 2012-03-07 MED ORDER — INSULIN GLARGINE 100 UNIT/ML ~~LOC~~ SOLN
5.0000 [IU] | SUBCUTANEOUS | Status: AC
  Administered 2012-03-07: 5 [IU] via SUBCUTANEOUS

## 2012-03-07 MED ORDER — MORPHINE SULFATE 2 MG/ML IJ SOLN: 2.0000 mg | INTRAMUSCULAR | Status: DC | PRN

## 2012-03-07 MED ORDER — PRASUGREL HCL 10 MG PO TABS
ORAL_TABLET | ORAL | Status: AC
  Filled 2012-03-07: qty 6

## 2012-03-07 MED ORDER — NITROGLYCERIN 0.2 MG/ML ON CALL CATH LAB
INTRAVENOUS | Status: AC
  Filled 2012-03-07: qty 1

## 2012-03-07 MED ORDER — SODIUM CHLORIDE 0.9 % IJ SOLN
3.0000 mL | Freq: Two times a day (BID) | INTRAMUSCULAR | Status: DC
  Administered 2012-03-08: 3 mL via INTRAVENOUS

## 2012-03-07 MED ORDER — SODIUM CHLORIDE 0.9 % IJ SOLN: 3.0000 mL | INTRAMUSCULAR | Status: DC | PRN

## 2012-03-07 MED ORDER — MIDAZOLAM HCL 2 MG/2ML IJ SOLN
INTRAMUSCULAR | Status: AC
  Filled 2012-03-07: qty 2

## 2012-03-07 MED ORDER — MORPHINE SULFATE 4 MG/ML IJ SOLN
4.0000 mg | Freq: Once | INTRAMUSCULAR | Status: AC
  Administered 2012-03-07: 4 mg via INTRAVENOUS
  Filled 2012-03-07: qty 1

## 2012-03-07 MED ORDER — INSULIN ASPART 100 UNIT/ML ~~LOC~~ SOLN
5.0000 [IU] | Freq: Once | SUBCUTANEOUS | Status: AC
  Administered 2012-03-07: 5 [IU] via SUBCUTANEOUS
  Filled 2012-03-07: qty 5

## 2012-03-07 MED ORDER — SODIUM CHLORIDE 0.9 % IV SOLN
INTRAVENOUS | Status: DC
  Administered 2012-03-07: 125 mL/h via INTRAVENOUS

## 2012-03-07 MED ORDER — ADENOSINE 6 MG/2ML IV SOLN
INTRAVENOUS | Status: AC
  Filled 2012-03-07: qty 2

## 2012-03-07 MED ORDER — INSULIN ASPART 100 UNIT/ML ~~LOC~~ SOLN
0.0000 [IU] | Freq: Three times a day (TID) | SUBCUTANEOUS | Status: DC
  Administered 2012-03-08: 8 [IU] via SUBCUTANEOUS
  Administered 2012-03-08: 11 [IU] via SUBCUTANEOUS

## 2012-03-07 MED ORDER — ATORVASTATIN CALCIUM 80 MG PO TABS
80.0000 mg | ORAL_TABLET | Freq: Every day | ORAL | Status: DC
  Administered 2012-03-08 – 2012-03-11 (×4): 80 mg via ORAL
  Filled 2012-03-07 (×4): qty 1

## 2012-03-07 MED ORDER — VERAPAMIL HCL 2.5 MG/ML IV SOLN
INTRAVENOUS | Status: AC
  Filled 2012-03-07: qty 2

## 2012-03-07 MED ORDER — LABETALOL HCL 5 MG/ML IV SOLN
20.0000 mg | INTRAVENOUS | Status: DC | PRN
  Administered 2012-03-07 (×3): 20 mg via INTRAVENOUS
  Filled 2012-03-07 (×4): qty 4

## 2012-03-07 MED ORDER — NITROGLYCERIN IN D5W 200-5 MCG/ML-% IV SOLN
2.0000 ug/min | INTRAVENOUS | Status: DC
  Filled 2012-03-07: qty 250

## 2012-03-07 MED ORDER — CARVEDILOL 6.25 MG PO TABS
6.2500 mg | ORAL_TABLET | Freq: Two times a day (BID) | ORAL | Status: DC
  Administered 2012-03-07 – 2012-03-09 (×4): 6.25 mg via ORAL
  Filled 2012-03-07 (×6): qty 1

## 2012-03-07 MED ORDER — INSULIN GLARGINE 100 UNIT/ML ~~LOC~~ SOLN
5.0000 [IU] | Freq: Every day | SUBCUTANEOUS | Status: DC
Start: 2012-03-08 — End: 2012-03-08

## 2012-03-07 MED ORDER — ACETAMINOPHEN 325 MG PO TABS: 650.0000 mg | ORAL_TABLET | ORAL | Status: DC | PRN

## 2012-03-07 MED ORDER — ONDANSETRON HCL 4 MG/2ML IJ SOLN: 4.0000 mg | Freq: Four times a day (QID) | INTRAMUSCULAR | Status: DC | PRN

## 2012-03-07 MED ORDER — SODIUM CHLORIDE 0.9 % IV SOLN
0.2500 mg/kg/h | INTRAVENOUS | Status: DC
  Filled 2012-03-07: qty 250

## 2012-03-07 MED ORDER — HEPARIN (PORCINE) IN NACL 2-0.9 UNIT/ML-% IJ SOLN
INTRAMUSCULAR | Status: AC
  Filled 2012-03-07: qty 2000

## 2012-03-07 MED ORDER — LIDOCAINE HCL (PF) 1 % IJ SOLN
INTRAMUSCULAR | Status: AC
  Filled 2012-03-07: qty 30

## 2012-03-07 MED ORDER — SODIUM CHLORIDE 0.9 % IV SOLN: 250.0000 mL | INTRAVENOUS | Status: DC

## 2012-03-07 MED ORDER — ASPIRIN 81 MG PO CHEW
CHEWABLE_TABLET | ORAL | Status: AC
  Filled 2012-03-07: qty 4

## 2012-03-07 MED ORDER — ONDANSETRON HCL 4 MG/2ML IJ SOLN
4.0000 mg | Freq: Once | INTRAMUSCULAR | Status: AC
  Administered 2012-03-07: 4 mg via INTRAVENOUS
  Filled 2012-03-07: qty 2

## 2012-03-07 MED ORDER — NITROGLYCERIN IN D5W 200-5 MCG/ML-% IV SOLN
20.0000 ug/min | INTRAVENOUS | Status: DC
  Administered 2012-03-07: 20 ug/min via INTRAVENOUS

## 2012-03-07 MED ORDER — PRASUGREL HCL 10 MG PO TABS
10.0000 mg | ORAL_TABLET | Freq: Every day | ORAL | Status: DC
  Administered 2012-03-08 – 2012-03-11 (×4): 10 mg via ORAL
  Filled 2012-03-07 (×5): qty 1

## 2012-03-07 MED ORDER — FENTANYL CITRATE 0.05 MG/ML IJ SOLN
INTRAMUSCULAR | Status: AC
  Filled 2012-03-07: qty 2

## 2012-03-07 NOTE — ED Provider Notes (Signed)
History     CSN: [id number]  Arrival date & time 03/07/12  1114   First MD Initiated Contact with Patient 03/07/12 1155      Chief Complaint  Patient presents with  . Chest Pain    (Consider location/radiation/quality/duration/timing/severity/associated sxs/prior treatment) HPI Comments: [name] is a 66 y.o. Male  who complains of chest pain, waxing and waning, for several days without known etiology or specific causes of worsening. He has not tried a medication for it. He denies shortness of breath, nausea, or vomiting. He has been diaphoretic today. He has not seen a doctor recently. He stopped all his medicines on his own 2 years ago. He does not know of any aggravating or palliative factors.  The history is provided by the patient.    Past Medical History  Diagnosis Date  . Coronary artery disease   . Diabetes mellitus   . Arthritis   . Hypertension   . Hypertensive crisis 03/07/2012  . Unstable angina 03/07/2012  . DM (diabetes mellitus),poorly controlled 03/07/2012  . CAD (coronary artery disease), with CABG in 2009 after an MI 03/07/2012  . Myocardial infarction     Past Surgical History  Procedure Date  . Cardiac surgery   . Coronary artery bypass graft   . Cardiac catheterization     History reviewed. No pertinent family history.  History  Substance Use Topics  . Smoking status: Former Smoker    Quit date: 09/10/1981  . Smokeless tobacco: Never Used  . Alcohol Use: 0.6 oz/week    1 Glasses of wine per week      Review of Systems  All other systems reviewed and are negative.    Allergies  Review of patient's allergies indicates no known allergies.  Home Medications  No current outpatient prescriptions on file.  BP 157/102  Pulse 92  Temp 98.4 F (36.9 C) (Oral)  Resp 16  Ht 5\' 8"  (1.727 m)  Wt 180 lb (81.647 kg)  BMI 27.37 kg/m2  SpO2 98%  Physical Exam  Nursing note and vitals reviewed. Constitutional: He is oriented to  person, place, and time. He appears well-developed. He appears distressed (Uncomfortable).  HENT:  Head: Normocephalic and atraumatic.  Right Ear: External ear normal.  Left Ear: External ear normal.  Eyes: Conjunctivae and EOM are normal. Pupils are equal, round, and reactive to light.  Neck: Normal range of motion and phonation normal. Neck supple.  Cardiovascular: Regular rhythm, normal heart sounds and intact distal pulses.        Tachycardic  Pulmonary/Chest: Effort normal and breath sounds normal. He exhibits no bony tenderness.  Abdominal: Soft. Normal appearance. There is no tenderness.  Musculoskeletal: Normal range of motion.  Neurological: He is alert and oriented to person, place, and time. He has normal strength. No cranial nerve deficit or sensory deficit. He exhibits normal muscle tone. Coordination normal.  Skin: Skin is warm and intact.       Diaphoretic  Psychiatric: He has a normal mood and affect. His behavior is normal. Judgment and thought content normal.    ED Course  Procedures (including critical care time) ASA given  At first Evaluation 13:50-IV Labetolol repeated boluses, failed to lower BP adequately. NTG drip ordered.1 Nitroglycerin drip improved the MAP to less than 126.   Date: 03/07/2012  Rate: 128  Rhythm: sinus tachycardia  QRS Axis: normal  Intervals: normal  ST/T Wave abnormalities: normal  Conduction Disutrbances:none  Narrative Interpretation: Inferior and lateral Q waves  Old  EKG Reviewed: changes noted   CRITICAL CARE Performed by: Daleen Bo L   Total critical care time: 45 minutes  Critical care time was exclusive of separately billable procedures and treating other patients.  Critical care was necessary to treat or prevent imminent or life-threatening deterioration.  Critical care was time spent personally by me on the following activities: development of treatment plan with patient and/or surrogate as well as nursing,  discussions with consultants, evaluation of patient's response to treatment, examination of patient, obtaining history from patient or surrogate, ordering and performing treatments and interventions, ordering and review of laboratory studies, ordering and review of radiographic studies, pulse oximetry and re-evaluation of patient's condition.    Labs Reviewed  CBC WITH DIFFERENTIAL - Abnormal; Notable for the following:    RBC 6.60 (*)     Hemoglobin 18.4 (*)     MCV 77.7 (*)     All other components within normal limits  BASIC METABOLIC PANEL - Abnormal; Notable for the following:    Sodium 134 (*)     Glucose, Bld 352 (*)     BUN 24 (*)     Creatinine, Ser 1.55 (*)     GFR calc non Af Amer 46 (*)     GFR calc Af Amer 53 (*)     All other components within normal limits  URINALYSIS, ROUTINE W REFLEX MICROSCOPIC - Abnormal; Notable for the following:    Specific Gravity, Urine 1.036 (*)     Glucose, UA >1000 (*)     Hgb urine dipstick SMALL (*)     Ketones, ur 15 (*)     Protein, ur >300 (*)     All other components within normal limits  TROPONIN I - Abnormal; Notable for the following:    Troponin I 0.97 (*)     All other components within normal limits  URINE MICROSCOPIC-ADD ON - Abnormal; Notable for the following:    Squamous Epithelial / LPF FEW (*)     Casts GRANULAR CAST (*)  HYALINE CASTS   All other components within normal limits  GLUCOSE, CAPILLARY - Abnormal; Notable for the following:    Glucose-Capillary 348 (*)     All other components within normal limits  URINE CULTURE  CARDIAC PANEL(CRET KIN+CKTOT+MB+TROPI)  CARDIAC PANEL(CRET KIN+CKTOT+MB+TROPI)  MAGNESIUM  TSH  PROTIME-INR  APTT  HEMOGLOBIN A1C   Dg Chest Port 1 View  03/07/2012  *RADIOLOGY REPORT*  Clinical Data: Hypertensive crisis  PORTABLE CHEST - 1 VIEW  Comparison: None.  Findings: Upper normal heart size.  Clear lungs.  No pneumothorax and no pleural effusion.  Normal vascularity.  IMPRESSION:  No active cardiopulmonary disease.  Original Report Authenticated By: Jamas Lav, M.D.     Diagnoses: 1. Hypertensive Urgency 2. Non-ST segment elevation MI 3. Hyperglycemia   MDM  Uncontrolled hypertension, with ST segment elevation MI. Patient improved in the ED with treatment given. Cardiology evaluation done. They elected to take him to the catheterization lab for definitive evaluation and management of acute coronary syndrome; related to medication noncompliance..  Plan: Admit- Dr Ellyn Hack- Cardiology        Richarda Blade, MD 03/07/12 [phone]

## 2012-03-07 NOTE — ED Notes (Signed)
Cardiologist at bedside.  

## 2012-03-07 NOTE — ED Notes (Signed)
Pt presents with 1 week h/o chest "fullness".  Pt reports pain is intermittent, does not radiate.  Pt reports symptoms are worse today.  Pt denies any shortness of breath or nausea.

## 2012-03-07 NOTE — Brief Op Note (Signed)
03/07/2012  6:35 PM  PATIENT:  [name]  66 y.o. male who presented with NSTEMI - and profound Hypertension - BP 230/150.  Troponin #1 was 0.97.  He has a h/o CABG x 4 in 2009 & DM.  PRE-OPERATIVE DIAGNOSIS:  NSTEMI  POST-OPERATIVE DIAGNOSIS:    Severe multivessel CAD with the acute lesion being a distal occlusion of the SVG-OM  S/p Successful Aspiration Thrombectomy, PTCA and PCI of the distal SVG-OM with a Multi-LInk Vision BMS 2.5 mm x 15 mm - post dilated to 2.21mm  Severe multisite disease of the SVG-RCA with distal RPDA 95% stenosis  Severe distal LAD stenosis after widely patent LIMA insertion  Widely Patent SVG-Diag  PROCEDURE:  Procedure(s) (LRB): LEFT HEART CATHETERIZATION WITH CORONARY ANGIOGRAM (N/A) PERCUTANEOUS CORONARY STENT INTERVENTION (PCI-S) (Right)  SURGEON:  Leonie Man, MD - Primary  ANESTHESIA:   local and IV sedation -- 1mg  Versed 74mcg Fentanyl  EBL:  < 51ml  LOCAL MEDICATIONS USED:  LIDOCAINE 27ml;   DICTATION: .Note written in EPIC  PLAN OF CARE: Admit to inpatient ; plan for staged PCI of SVG-RCA and native RCA.  PATIENT DISPOSITION:  PACU - hemodynamically stable.   Delay start of Pharmacological VTE agent (>24hrs) due to surgical blood loss or risk of bleeding: no   [name], M.D., M.S. [facility] [address] [place], [place]  [id number]  [phone] Pager # [phone]  03/07/2012 6:40 PM

## 2012-03-07 NOTE — CV Procedure (Addendum)
[facility] CARDIAC CATHETERIZATION AND PERCUTANEOUS CORONARY INTERVENTION REPORT  NAME:  [name]   MRN: [id number] DOB:  04/27/1946   ADMIT DATE: 03/07/2012  INTERVENTIONAL CARDIOLOGIST: Leonie Man, M.D., MS PRIMARY CARE PROVIDER: William Hamburger, MD PRIMARY CARDIOLOGIST: Shelva Majestic, MD   PATIENT:  [name] is a 66 y.o. male with a history of CABG x4 in 2009, Diabetes Type 2, and dyslipidemia he was lost to medical followupone year following his CABG. He is off all medications for least one year. He presented to the emergency room today with chest pain has been off and on since Monday of this week. Associated symptoms included diaphoresis. More of right sided fullness in his chest as high describes the discomfort. Denies nausea vomiting or shortness of breath. He has not slept very well for the last several nights secondary to waking the chest pain. Here in the emergency room his blood pressure on arrival was 218/139. Has been given aspirin morphine and labetalol and nitroglycerin drip has been started. Pain currently is mild but still present upon initial evaluation. Troponin level 0.97. Based on his continued discomfort despite blood pressure reduction, and elevated troponin he was taken urgently to the cardiac catheterization lab for coronary angiography and possible PCI.   PRE-OPERATIVE DIAGNOSIS:    NSTEMI - with persistent anginal pain despite medical therapy   Hypertensive Emergency  Diabetes Type 2 - Uncontrolled  History of CABG x 4 (LIMA-LAD, SVG-OM, SVG-Diag, SVG-RCA in 2009)  PROCEDURES PERFORMED:    Left Heart Catheterization via 5 Fr 6 access  Native Coronary Angiography  Left Internal Mammary Graft Angiography  Saphenous Vein Graft Angiography  IC NTG Injection 246mcg x 3  IC Adenosine Injection 52mcg x 3  IC Verapamil Injection 120mcg x 1  Aspiration thrombectomy of the SVG-OM  Successful Complex  Percutaneous Coronary Artery Intervention on the distal SVG-OM (into the Anastomosis) with a Multi-Link Vision BMS 2.5 mm x 15 mm; final diameter 2.8 mm  PROCEDURE: Consent: The procedure with Risks/Benefits/Alternatives and Indications was reviewed with the patient (and family).  All questions were answered.    Risks / Complications include, but not limited to: Death, MI, CVA/TIA, VF/VT (with defibrillation), Bradycardia (need for temporary pacer placement), contrast induced nephropathy, bleeding / bruising / hematoma / pseudoaneurysm, vascular or coronary injury (with possible emergent CT or Vascular Surgery), adverse medication reactions, infection.    Risks of procedure as well as the alternatives and risks of each were explained to the (patient/caregiver).  Consent for procedure obtained. Consent for signed by MD and patient with RN witness -- placed on chart.  Procedure: The patient was brought to the [facility] in the fasting state and prepped and draped in the usual sterile fashion for Right groin access. Sterile technique was used including antiseptics, cap, gloves, gown, hand hygiene, mask and sheet.  Skin prep: Chlorhexidine;  Time Out: Verified patient identification, verified procedure, site/side was marked, verified correct patient position, special equipment/implants available, medications/allergies/relevent history reviewed, required imaging and test results available.  Performed   Access: Right Common Femoral Artery; 5 Fr Sheath - Fluoroscopically guided, Modified Seldinger technique.  Diagnostic Angiograhy:  All catheters were exchanged over a J wire.  The JR4-IMA catheter was over a long-exchange J wire.  Left Coronary Angiography -- JL 4  LIMA Angiography - IMA Catheter (the JR4 catheter was advanced into the Left Subclavian Artery and exchanged for the IMA catheter).  Right Coronary, SVG-RCA, SVG-Diag,  SVG-OM Angiography - JR4  Catheter  Following PCI, the Guide Catheter was exchanged for an Angled Pigtail catheter that was used to cross the Aortic Valve for LV Hemodynamic measurement and Aortic Valve Pull-back gradient.  No LV Gram was performed to conserve contrast.  Hemodynamics:  Central Aortic / Mean Pressures: 149/103 mmHg; 122 mmHg  Left Ventricular Pressures / EDP: 151/16 mmHg; 24 mmHg -- initial EDP prior to PCI and continued IV fluid hydration was 13 mmHg.  Coronary Anatomy: Right dominant system  Left Main:  moderate caliber vessel, calcified with an occluded LAD ostium. The vessel continues into the native Left Circumflex that has a Proximal 80% lesion before appears to be the stump of the obtuse marginal.  The vessel and courses the AV groove it is a 60% lesion in the mid vessel just after would appear to be to stop another marginal branch. The distal vessels are diffusely diseased.  The OM1 was a relatively small caliber vessel on pre-CABG angiography, suggesting the vein graft was OM 2  LIMA to LAD:  Widely patent graft the touches down to the mid LAD that is large in caliber.  Retrograde flow to the diagonal branch and several septal perforators before its occluded just prior to the first diagonal. Antegrade flow is TIMI normal LAD until the distal, near apical portion where there is subtotal occlusion followed by severe extensive disease report reconstitutes around the apex. Comparison to pre-CABG angiography suggests the initial 90+ % lesion was present but there was extensive continued for down around the apex for full wrap around LAD.    SVG to D1:  Widely patent graft to a moderate to small-caliber vessel.  Antegrade flow perfuses the anterolateral wall minimal luminal irregularities. Retrograde flow back to the native LAD only.    SVG to OM2:  The graft is patent up to the distal portion where it is 100% occluded just prior to the anastomosis as appreciated following angioplasty. There was extensive  contrast hangup after initial injection that remained throughout the remainder of diagnostic angiography.   Following intervention, the vein graft touches down to a moderate caliber OM 2 gives off initial branch before bifurcating into the terminal branch. The downstream vessels were relatively free of significant disease.   RCA:  The native RCA has a shepherd's crook bend with a 80% lesion in the proximal portion, and tandem 40 a 6% lesion before the vessel is occluded just prior to the genu. The distal RCA system was perfused via the SVG-RPDA SVG-RPDA:  large-caliber graft with tandem 50-60% followed by 70% irregular lesions in the distal portion of the mid graft.  The graft touches down to the PDA which has a proximal 90% focal lesion in a small moderate caliber vessel.  There is retrograde flow up the bifurcation minimal flow up the native RCA perfusing antegrade into the RPL system where there is a 60% lesion at the initial bifurcation.  After reviewing the initial images, the culprit lesion was the SVG to OM. The decision was made to then proceed with PCI of this graft, and then later a staged PCI of the SVG to RCA as well as RPDA and possible distal LAD next week.   Percutaneous Coronary Intervention:  Lesion-- distal SVG-OM into native OM, 100% thrombotic occlusion with no reflow on initial angiography. TIMI 0 flow. Complex, difficult PCI do to extensive thrombus material as well as a SVG debris Sheath exchanged for 6 Fr; Angiomax bolus/ gtt started, 60mg  PO Effient .  ACT greater than 200 sec  insured  Guide: 6 Fr  AL-1  Guidewire:  BMW  Intracoronary Nitroglycerin followed by Intracoronary Adenosine injections were administered  After BMW wire was advanced into the native Obtuse Marginal, 2 passes were made with the largest vessel Pronto V4 Aspiration Thrombectomy Catheter -- this resulted in TIMI 1 flow into the bypassed OM.   Significant amount of thrombus and debris was aspirated.    Predilation Balloon #1:  Emerge Push MR 1.5  mm x 15  mm;   6 Atm x 30  Sec, 8  Atm x 34  Sec Additional Intracoronary Nitroglycerin injected resulting in TIMI 2 flow into the downstream grafted OM. This revealed a moderate caliber OM of at least 2.5 mm in diameter  as well as a distal graft 90+ % residual stenosis.   Predilation Balloon #2: Emerge MR 2.5 mm x 15 mm;   10  Atm x 30Sec -- at the distal graft stenosis.  4 Atm x 30  Sec -- into the native OM. Following balloon inflation, initially there was TIMI-3 flow distally but then again no reflow despite IC Adenosine injection. The decision was then made to proceed with stenting of the distal graft into the native OM. I reviewed the images with Drs. Burt Knack and [place] who both agreed with this plan of action   Stent: Multi-Link Vision BMS  2.5 mm x  15 mm;    14  Atm x  45 Sec,  16Atm x 45  Sec -- as postdilatation to final diameter 2.8 mm. Additional injection of Intracoronary Adenosine was administered, however the remained TIMI 2 flow despite what appeared to be excellent stent apposition. Intracoronary Verapamil was injected resulting in restoration of TIMI-3 flow distally. Post-deployment cineangiography with and without guidewire in place was performed in multiple orthogonal views demonstrating excellent stent deployment and did not reveal evidence of dissection or perforation with TIMI 3 flow distally.  The sheath was sutured in place with plan to be removed 2 hours after discontinuation of Angiomax.   ANESTHESIA:   Local Lidocaine  18 ml MEDICATIONS:  SEDATION:   1  mg IV Versed,  50  mcg IV fentanyl   Omnipaque contrast: 225 mL   For PCI: Angiomax bolus and drip, weight; Effient 60 mg by mouth  Intracoronary Nitroglycerin: 200 mcg x3   Intracoronary Adenosine: 6 mcg x3  Intracoronary Verapamil: 100 mcg x3  EBL:   <  86ml  PATIENT DISPOSITION:    The patient tolerated procedure without a couple occasions. He  was stable before, during, and after the procedure.  Patient was transported to the PACU holding area followed by CCU in a hemodynamically stable, chest pain-free condition.   POST-OPERATIVE DIAGNOSIS:    Severe multivessel native coronary disease as documented above.   100% distal thrombotic occlusion of the SVG to OM -- status/post complex percutaneous intervention with aspiration thrombectomy PTCA and Stent placement to the distal SVG into the native OM with restoration of TIMI-3 flow downstream despite original no-reflow.  Severe sequential lesions in the SVG to RCA as well as severe RPDA lesion.  Severe distal LAD lesion, with significant progression of disease from pre-CABG angiography.  Initially low LVEDP given the patient's hypertensive urgency upon arrival in the setting of a non-ST elevation MI.   PLAN OF CARE:   Admit to CCU overnight for monitoring, with likely transfer to [facility] tomorrow. Continue aggressive hydration post catheterization for increased Creatinine on initial arrival that is suggestive  of hypovolemia.    Will continue IV Angiomax for 3 hours post PCI, and initiate IV Heparin 8 hours post sheath removal given extensive residual CAD.   Check 2-D echo over the weekend.  Dual Antiplatelet Therapy for at least one month preferably one year. Will obtain care manager consultation to assist with Effient versus potentially Plavix following the first month.   Will review films with colleagues over the weekend, however will likely proceed with base PCI of at least the SVG to RCA and RPDA as well as possibly the distal LAD through the LIMA graft.  Aggressive Blood Pressure and Diabetes control.  We'll consult hospitalist for assistance with Diabetes control in order to initiate a stable outpatient regiment. He will need to reestablish primary care.  Start Carvedilol to 6.25 mg twice a day.   Leonie Man, M.D., M.S. [facility] [address] [place], [place]  [address]  [phone]  03/07/2012 6:32 PM

## 2012-03-07 NOTE — ED Notes (Signed)
Patient stated that his chest pain is getting better, "just a hair line" maybe a 2/10. BP= 139/90, Will continue to monitor.

## 2012-03-07 NOTE — Consult Note (Signed)
[facility]   Patient Demographics  [name], is a 66 y.o. male  CSN: [id number]  MRN: [id number]  DOB - 02/27/1946  Admit date - 03/07/2012  Admitting Physician Leonie Man, MD  Outpatient Primary MD for the patient is DEFAULT,PROVIDER, MD  LOS - 0   Consult requested in the Hospital by Pixie Casino, MD, On 03/07/2012   Reason for consult: diabetes management   With History of -  Active Problems:  Hypertensive crisis  Unstable angina  DM (diabetes mellitus),poorly controlled  CAD (coronary artery disease), with CABG in 2009 after an MI   Past Medical History  Diagnosis Date  . Coronary artery disease   . Diabetes mellitus   . Arthritis   . Hypertension   . Hypertensive crisis 03/07/2012  . Unstable angina 03/07/2012  . DM (diabetes mellitus),poorly controlled 03/07/2012  . CAD (coronary artery disease), with CABG in 2009 after an MI 03/07/2012  . Myocardial infarction      Past Surgical History  Procedure Date  . Cardiac surgery   . Coronary artery bypass graft   . Cardiac catheterization     Past Surgical History  Procedure Date  . Cardiac surgery   . Coronary artery bypass graft   . Cardiac catheterization     HPI:-  [name] [id number] is a 66 y.o. male,  With the past medical history of diabetes mellitus hypertension coronary artery disease who presents with complaints of chest pain was diagnosed with non-STEMI, status post cardiac cath today, with severe multivessel coronary artery disease with acute lesion being in distal occlusion of the SVG- OM, status post aspiration thrombectomy, PTCA and PCI of the distal SVG-OM, consult was called for management of diabetes mellitus, patient was diagnosed with diabetes on 2009 initially was started on metformin, but patient has not been taking any medication secondary to myself and as he reports he ran out of insurance has not been  taking any medication for some, a glycohemoglobin was ordered patient fingersticks was running in the mid 300s to 250    Review of Systems    In addition to the HPI above, No Fever-chills, No Headache, No changes with Vision or hearing, No problems swallowing food or Liquids,  Chest pain improved, Cough or Shortness of Breath, No Abdominal pain, No Nausea or Vommitting, Bowel movements are regular, No Blood in stool or Urine, No dysuria, No new skin rashes or bruises, No new joints pains-aches,  No new weakness, tingling, numbness in any extremity, No recent weight gain or loss, No polyuria, polydypsia or polyphagia, No significant Mental Stressors.  A full 10 point review of systems was obtained except as dictated above all other review of systems are negative.     Social History History  Substance Use Topics  . Smoking status: Former Smoker    Quit date: 09/10/1981  . Smokeless tobacco: Never Used  . Alcohol Use: 0.6 oz/week    1 Glasses of wine per week     Family History History reviewed. No pertinent family history.   Prior to Admission medications   Not on File    No Known Allergies   Physical Exam   Vitals  Blood pressure 157/102, pulse 92, temperature 98.4 F (36.9 C), temperature source Oral, resp. rate 16, height 5\' 8"  (1.727 m), weight 81.647 kg (180 lb), SpO2 98.00%.  1. General well-nourished lying in bed in NAD,     2. Normal affect and insight, Not Suicidal or Homicidal, Awake Alert, Oriented *  3.  3. No F.N deficits, ALL C.Nerves Intact,Sensation intact all   4 extremities, Plantars down going.  4. Ears and Eyes appear Normal, Conjunctivae clear, PERRLA. Moist Oral Mucosa.  5. Supple Neck, No JVD, No cervical lymphadenopathy appriciated, No Carotid Bruits.  6. Symmetrical Chest wall movement, Good air movement bilaterally, CTAB.  7. RRR, No Gallops, Rubs or Murmurs, No Parasternal Heave.  8. Positive Bowel Sounds, Abdomen Soft, Non  tender, No organomegaly appriciated,       No rebound -guarding or rigidity.  9.  No Cyanosis, Normal Skin Turgor, No Skin Rash or Bruise.  10. Good muscle tone,  joints appear normal , no effusions, Normal ROM.  11. No Palpable Lymph Nodes in Neck or Axillae   Data Review  CBC  Lab 03/07/12 1202  WBC 8.3  HGB 18.4*  HCT 51.3  PLT 154  MCV 77.7*  MCH 27.9  MCHC 35.9  RDW 13.7  LYMPHSABS 1.8  MONOABS 0.5  EOSABS 0.1  BASOSABS 0.0  BANDABS --    Chemistries   Lab 03/07/12 1202  NA 134*  K 3.6  CL 97  CO2 20  GLUCOSE 352*  BUN 24*  CREATININE 1.55*  CALCIUM 10.1  MG --  AST --  ALT --  ALKPHOS --  BILITOT --   ------------------------------------------------------------------------------------------------------------------ estimated creatinine clearance is 47.2 ml/min (by C-G formula based on Cr of 1.55). ------------------------------------------------------------------------------------------------------------------ No results found for this basename: HGBA1C:2 in the last 72 hours ------------------------------------------------------------------------------------------------------------------ No results found for this basename: CHOL:2,HDL:2,LDLCALC:2,TRIG:2,CHOLHDL:2,LDLDIRECT:2 in the last 72 hours ------------------------------------------------------------------------------------------------------------------ No results found for this basename: TSH,T4TOTAL,FREET3,T3FREE,THYROIDAB in the last 72 hours ------------------------------------------------------------------------------------------------------------------ No results found for this basename: VITAMINB12:2,FOLATE:2,FERRITIN:2,TIBC:2,IRON:2,RETICCTPCT:2 in the last 72 hours  Coagulation profile No results found for this basename: INR:5,PROTIME:5 in the last 168 hours  No results found for this basename: DDIMER:2 in the last 72 hours  Cardiac Enzymes  Lab 03/07/12 1202  CKMB --  TROPONINI 0.97*    MYOGLOBIN --   ------------------------------------------------------------------------------------------------------------------ No components found with this basename: POCBNP:3  ------------------------------------------------------------------------------------------------------------------- Micro Results No results found for this or any previous visit (from the past 240 hour(s)). ------------------------------------------------------------------------------------------------------------------ Radiology Reports Dg Chest Port 1 View  03/07/2012  *RADIOLOGY REPORT*  Clinical Data: Hypertensive crisis  PORTABLE CHEST - 1 VIEW  Comparison: None.  Findings: Upper normal heart size.  Clear lungs.  No pneumothorax and no pleural effusion.  Normal vascularity.  IMPRESSION: No active cardiopulmonary disease.  Original Report Authenticated By: Jamas Lav, M.D.    Scheduled Meds:   . adenosine      . aspirin      . aspirin  324 mg Oral Once  . aspirin  81 mg Oral Daily  . atorvastatin  80 mg Oral q1800  . bivalirudin      . bivalirudin      . carvedilol  6.25 mg Oral BID WC  . fentaNYL      . heparin      . insulin aspart  5 Units Subcutaneous Once  . lidocaine      . midazolam      .  morphine injection  4 mg Intravenous Once  . nitroGLYCERIN      . ondansetron (ZOFRAN) IV  4 mg Intravenous Once  . prasugrel      . prasugrel  10 mg Oral Daily  . sodium chloride  3 mL Intravenous Q12H  . verapamil       Continuous Infusions:   . sodium chloride    .  sodium chloride    . bivalirudin (ANGIOMAX) infusion 5 mg/mL (Cath Lab,ACS,PCI indication)    . nitroGLYCERIN    . DISCONTD: sodium chloride 125 mL/hr (03/07/12 1219)  . DISCONTD: bivalirudin (ANGIOMAX) infusion 5 mg/mL (Cath Lab,ACS,PCI indication)    . DISCONTD: nitroGLYCERIN Stopped (03/07/12 1553)   PRN Meds:.acetaminophen, morphine injection, ondansetron (ZOFRAN) IV, sodium chloride, DISCONTD: labetalol  Assessment &  Plan  1. Active Problems:  Hypertensive crisis  Unstable angina  DM (diabetes mellitus),poorly controlled  CAD (coronary artery disease), with CABG in 2009 after an MI  1: Diabetes mellitus. Patient has non-insulin-dependent diabetes mellitus, noncompliant with medication will check hemoglobin A1c, secondary to poor 1 kidney function we'll avoid oral hypoglycemic agents at this point NovoLog sliding scale before meals, and we'll start him on Lantus we'll start with 5 units at bedtime and will adjust dose according to insulin requirement. 2. Acute renal failure failure. There is no slightly to volume depletion we'll continue with IV fluids we'll check a BMP in a.m.   3. Polycythemia. This is most likely to dehydration baseline hemoglobin before 4 ears was around 10 check CBC in a.m. if no improvement with fluids may need polycythemia work up.    Please review my Orders  Plan of care including tests being ordered have been discussed with the patient and wife who indicate understanding and agree with the plan  Thank you for the consult, we will follow the patient with you in the Hospital.  [facility], Antawn Sison M.D on 03/07/2012 at 7:33 PM  [facility] Office  [phone]

## 2012-03-07 NOTE — ED Notes (Signed)
Patient stated that his chest hurts worse to lay down, Elevated the head of his head to a position of comfort. NSR on the monitor. Will continue to monitor.

## 2012-03-07 NOTE — Progress Notes (Signed)
Groin level 0 prior to sheath removal. Sheath removed from right groin. Manual pressure held for 20 minutes beginning at 2259 and ending at 2319. Groin level 0 s/p sheath removal. Pedal pulse 2+. Cath instructions given. Pressure drgs applied. Sheath removed by Alton Revere, RN & Martinique Perkins, RN.  Perkins, [place]

## 2012-03-07 NOTE — ED Notes (Signed)
Dr. Eulis Foster at the bedside examining the patient. Will continue to monitor.

## 2012-03-07 NOTE — Progress Notes (Signed)
ANTICOAGULATION CONSULT NOTE - Initial Consult  Pharmacy Consult for Heparin Indication: chest pain/ACS  No Known Allergies  Patient Measurements: Height: 5\' 8"  (172.7 cm) Weight: 198 lb 10.2 oz (90.1 kg) IBW/kg (Calculated) : 68.4  Heparin Dosing Weight: 87 kg  Vital Signs: Temp: 98.4 F (36.9 C) (06/28 1117) Temp src: Oral (06/28 1117) BP: 143/93 mmHg (06/28 1930) Pulse Rate: 88  (06/28 1930)  Labs:  Basename 03/07/12 1202  HGB 18.4*  HCT 51.3  PLT 154  APTT --  LABPROT --  INR --  HEPARINUNFRC --  CREATININE 1.55*  CKTOTAL --  CKMB --  TROPONINI 0.97*    Estimated Creatinine Clearance: 53.2 ml/min (by C-G formula based on Cr of 1.55).   Medical History: Past Medical History  Diagnosis Date  . Coronary artery disease   . Diabetes mellitus   . Arthritis   . Hypertension   . Hypertensive crisis 03/07/2012  . Unstable angina 03/07/2012  . DM (diabetes mellitus),poorly controlled 03/07/2012  . CAD (coronary artery disease), with CABG in 2009 after an MI 03/07/2012  . Myocardial infarction     Medications:  No prescriptions prior to admission     Admit Complaint: 66 y.o.  male  admitted 03/07/2012 with chest pain.  Pharmacy consulted to dose heparin 8h after sheath out.  Assessment: Anticoagulation: ACS, patient is post cath with BMS to distal SVG-OM, plan for staged PCI.  Bivalirudin infusing at post cath dose, to stop at 2030,sheath to come out around 1030 p, heparin to start 8h after sheath out (0600 6/29).  Currently vitals and acess site stable.  Cardiovascular: CAD, CABG 2009, HTN: BP significantly elevated on admission, no home medications.  Other PMH: DM, Arthritis  Goal of Therapy:  Heparin level 0.3-0.7 units/ml Monitor platelets by anticoagulation protocol: Yes   Plan:  Start 1300 units/hr of heparin around 0630, 8h post sheat out. Check heparin level 6h after ggt starts. Daily heparin level CBC.  Thank you for allowing pharmacy to be a  part of this patients care team.  Rowe Jaylenn Pharm.D., BCPS Clinical Pharmacist 03/07/2012 7:48 PM Pager: [phone] Phone: [phone]

## 2012-03-07 NOTE — H&P (Signed)
NAME:  [name]   MRN: [id number] DOB:  [birth year]   ADMIT DATE: 03/07/2012  HISTORY AND PHYSICAL  Chief Complaint: chest pain off and on since Monday HPI:  66 year old African American male presents to the emergency room today with chest pain has been off and on since Monday of this week. Associated symptoms included diaphoresis. More of right sided fullness in his chest as high describes the discomfort. Denies nausea vomiting or shortness of breath. He has not slept very well for the last several nights secondary to waking the chest pain.  Here in the emergency room his blood pressure on arrival was 218/139.  Has been given aspirin morphine and labetalol and nitroglycerin drip has been started.  Pain currently is mild but still present.  Patient had a non-ST elevation MI in 2009 requiring Coronary artery bypass grafting x4 with a LIMA to the LAD vein graft to the diagonal branch and saphenous vein graft to the circumflex branch and saphenous vein graft to distal right coronary artery.  EF at that time was 50-55%.  Unfortunately patient has not had any medications to treat his heart disease his diabetes or his hypertension since 2010 not even taking an aspirin.    Past Medical History  Diagnosis Date  . Coronary artery disease   . Diabetes mellitus   . Arthritis   . Hypertension   . Hypertensive crisis 03/07/2012  . Unstable angina 03/07/2012  . DM (diabetes mellitus),poorly controlled 03/07/2012  . CAD (coronary artery disease), with CABG in 2009 after an MI 03/07/2012  . Myocardial infarction     Past Surgical History  Procedure Date  . Cardiac surgery   . Coronary artery bypass graft   . Cardiac catheterization     History reviewed. No pertinent family history. Not significant to this admission Social History:  reports that he quit smoking about 30 years ago. He has never used smokeless tobacco. He reports that he drinks about .6 ounces of alcohol per week. He reports that he  does not use illicit drugs.Married his wife is here with him today, they're both now retired  Allergies: No Known Allergies  Outpatient medications: None  Results for orders placed during the hospital encounter of 03/07/12 (from the past 48 hour(s))  CBC WITH DIFFERENTIAL     Status: Abnormal   Collection Time   03/07/12 12:02 PM      Component Value Range Comment   WBC 8.3  4.0 - 10.5 K/uL    RBC 6.60 (*) 4.22 - 5.81 MIL/uL    Hemoglobin 18.4 (*) 13.0 - 17.0 g/dL    HCT 51.3  39.0 - 52.0 %    MCV 77.7 (*) 78.0 - 100.0 fL    MCH 27.9  26.0 - 34.0 pg    MCHC 35.9  30.0 - 36.0 g/dL    RDW 13.7  11.5 - 15.5 %    Platelets 154  150 - 400 K/uL    Neutrophils Relative 72  43 - 77 %    Neutro Abs 5.9  1.7 - 7.7 K/uL    Lymphocytes Relative 22  12 - 46 %    Lymphs Abs 1.8  0.7 - 4.0 K/uL    Monocytes Relative 6  3 - 12 %    Monocytes Absolute 0.5  0.1 - 1.0 K/uL    Eosinophils Relative 1  0 - 5 %    Eosinophils Absolute 0.1  0.0 - 0.7 K/uL    Basophils Relative 0  0 - 1 %    Basophils Absolute 0.0  0.0 - 0.1 K/uL   BASIC METABOLIC PANEL     Status: Abnormal   Collection Time   03/07/12 12:02 PM      Component Value Range Comment   Sodium 134 (*) 135 - 145 mEq/L    Potassium 3.6  3.5 - 5.1 mEq/L    Chloride 97  96 - 112 mEq/L    CO2 20  19 - 32 mEq/L    Glucose, Bld 352 (*) 70 - 99 mg/dL    BUN 24 (*) 6 - 23 mg/dL    Creatinine, Ser 1.55 (*) 0.50 - 1.35 mg/dL    Calcium 10.1  8.4 - 10.5 mg/dL    GFR calc non Af Amer 46 (*) >90 mL/min    GFR calc Af Amer 53 (*) >90 mL/min   TROPONIN I     Status: Abnormal   Collection Time   03/07/12 12:02 PM      Component Value Range Comment   Troponin I 0.97 (*) <0.30 ng/mL   URINALYSIS, ROUTINE W REFLEX MICROSCOPIC     Status: Abnormal   Collection Time   03/07/12 12:27 PM      Component Value Range Comment   Color, Urine YELLOW  YELLOW    APPearance CLEAR  CLEAR    Specific Gravity, Urine 1.036 (*) 1.005 - 1.030    pH 5.5  5.0 - 8.0     Glucose, UA >1000 (*) NEGATIVE mg/dL    Hgb urine dipstick SMALL (*) NEGATIVE    Bilirubin Urine NEGATIVE  NEGATIVE    Ketones, ur 15 (*) NEGATIVE mg/dL    Protein, ur >300 (*) NEGATIVE mg/dL    Urobilinogen, UA 0.2  0.0 - 1.0 mg/dL    Nitrite NEGATIVE  NEGATIVE    Leukocytes, UA NEGATIVE  NEGATIVE   URINE MICROSCOPIC-ADD ON     Status: Abnormal   Collection Time   03/07/12 12:27 PM      Component Value Range Comment   Squamous Epithelial / LPF FEW (*) RARE    WBC, UA 0-2  <3 WBC/hpf    Bacteria, UA RARE  RARE    Casts GRANULAR CAST (*) NEGATIVE HYALINE CASTS   No results found.  ROS: General:No recent colds or fevers Skin:No rashes or ulcers HEENT:No blurred vision or double [id number] pain off and on since Monday 4 days ago PUL:No shortness of breath GI:No diarrhea constipation no melena GU:No hematuria or dysuria MS:No Arthritic complaints no joint pain Neuro:No syncope no lightheadedness no dizziness, No headaches Endo:Is diabetic but has not been treated since 2010   Blood pressure 134/82, pulse 90, temperature 98.4 F (36.9 C), temperature source Oral, resp. rate 13, height 5\' 8"  (1.727 m), weight 81.647 kg (180 lb), SpO2 100.00%. PE: General:Alert oriented African American male in obvious discomfort diaphoretic appears ill Skin:Cool Clammy HEENT:Normocephalic sclera clear Neck:Neck JVD no carotid bruits Heart:S1-S2 regular rate and rhythm S4 gallop is present no murmur Lungs:Diminished breath sounds but no rales rhonchi or wheezes [id number] bowel sounds soft nontender Ext:No edema 2+ pedal bilaterally Neuro:Alert and oriented x3 follows commands moves all extremities   Assessment/Plan Patient Active Problem List  Diagnosis  . Hypertensive crisis  . Unstable angina  . DM (diabetes mellitus),poorly controlled  . CAD (coronary artery disease), with CABG in 2009 after an MI   PLAN: Serial cardiac enzymes but with continued chest discomfort Dr. Ellyn Hack has seen the  patient and plan will  be to take to the cath lab now.We'll check hemoglobin A1c, restart hypertensive medications and cardiac medications, As well as lipid medications we'll check a lipid panel in the morning.  INGOLD,LAURA R 03/07/2012, 3:16 PM  I have seen & examined the patient in the ER along with Ms. Ingold.  I agree with her findings & assessment / plan.    66 y/o man with Dm-2 (poorly controlled), HTN & CAD - s/p Inferior STEMI in 2009 with MV Dz - s/p CABG x 4 (LIMA- LAD, SVG-Diag, SVG-OM, SVG-PDA) done for 95%prox RCA, 50-60% prox then 70% mid & 95% apical LAD, 80% D2, thrombus in Large OMB.  He was essentially lost to f/u & came off all of his medications ~1 yr post CABG.  He was on multiple medications for DM & HTN.  He now presents with several days of SSCpressure / fullness.  This is associated with diaphoresis, but no N/V.  Sx has been off & on until this  AM -- upon arrival his BP was ~230/111mmHg, now improved with Labetalol & NTG gtt, but he continues to have chest fullness, albeit less dramatic.  Given his known history & Troponin of 0.97 c/w NSTEMI (either HTN crisis vs. ACS), I think the most expeditious plan is to take him to the cath lab, once we are sure that his CXR does not suggest mediastinal widening c/w Aortic Dissection.  Cardiac Catheterization Consent Note  Performing MD:  Leonie Man, M.D., M.S.  Procedure:  Left Heart Catheterization with Native Coronary and Graft Angiography +/- PCI  The procedure with Risks/Benefits/Alternatives and Indications was reviewed with the patient and his wife.  All questions were answered.    Risks / Complications include, but not limited to: Death, MI, CVA/TIA, VF/VT (with defibrillation), Bradycardia (need for temporary pacer placement), contrast induced nephropathy, bleeding / bruising / hematoma / pseudoaneurysm, vascular or coronary injury (with possible emergent CT or Vascular Surgery), adverse medication reactions,  infection.    The patient (and family) voice understanding and agree to proceed.   I have signed the consent form and placed it on the chart for patient signature and RN witness.     Leonie Man, M.D., M.S. [facility] [address] [place], [place]  [address]  [phone]  03/07/2012 3:36 PM

## 2012-03-07 NOTE — ED Notes (Signed)
Dr. Eulis Foster was made aware about the result of the troponin , that the BP is still elevated and the patient is c/o increased chest discomfort. See new order.

## 2012-03-08 DIAGNOSIS — E1165 Type 2 diabetes mellitus with hyperglycemia: Secondary | ICD-10-CM

## 2012-03-08 DIAGNOSIS — D751 Secondary polycythemia: Secondary | ICD-10-CM

## 2012-03-08 DIAGNOSIS — IMO0001 Reserved for inherently not codable concepts without codable children: Secondary | ICD-10-CM

## 2012-03-08 DIAGNOSIS — N179 Acute kidney failure, unspecified: Secondary | ICD-10-CM

## 2012-03-08 DIAGNOSIS — I214 Non-ST elevation (NSTEMI) myocardial infarction: Secondary | ICD-10-CM | POA: Diagnosis present

## 2012-03-08 LAB — CBC
Hemoglobin: 14.5 g/dL (ref 13.0–17.0)
MCHC: 35.8 g/dL (ref 30.0–36.0)
RDW: 13.8 % (ref 11.5–15.5)

## 2012-03-08 LAB — MRSA PCR SCREENING: MRSA by PCR: NEGATIVE

## 2012-03-08 LAB — MAGNESIUM: Magnesium: 2.1 mg/dL (ref 1.5–2.5)

## 2012-03-08 LAB — BASIC METABOLIC PANEL
GFR calc Af Amer: 64 mL/min — ABNORMAL LOW (ref >90)
GFR calc non Af Amer: 55 mL/min — ABNORMAL LOW (ref >90)
Glucose, Bld: 341 mg/dL — ABNORMAL HIGH (ref 70–99)
Potassium: 3.5 mEq/L (ref 3.5–5.1)
Sodium: 141 mEq/L (ref 135–145)

## 2012-03-08 LAB — HEMOGLOBIN A1C: Hgb A1c MFr Bld: 11.6 % — ABNORMAL HIGH (ref <5.7)

## 2012-03-08 LAB — CARDIAC PANEL(CRET KIN+CKTOT+MB+TROPI)
Relative Index: 3.7 — ABNORMAL HIGH (ref 0.0–2.5)
Troponin I: >20 ng/mL (ref <0.30)

## 2012-03-08 LAB — TSH: TSH: 1.07 u[IU]/mL (ref 0.350–4.500)

## 2012-03-08 LAB — GLUCOSE, CAPILLARY: Glucose-Capillary: 245 mg/dL — ABNORMAL HIGH (ref 70–99)

## 2012-03-08 MED ORDER — INSULIN ASPART 100 UNIT/ML ~~LOC~~ SOLN
0.0000 [IU] | Freq: Every day | SUBCUTANEOUS | Status: DC
  Administered 2012-03-08: 3 [IU] via SUBCUTANEOUS
  Administered 2012-03-09: 2 [IU] via SUBCUTANEOUS

## 2012-03-08 MED ORDER — INSULIN GLARGINE 100 UNIT/ML ~~LOC~~ SOLN
10.0000 [IU] | Freq: Every day | SUBCUTANEOUS | Status: DC
  Administered 2012-03-08: 10 [IU] via SUBCUTANEOUS

## 2012-03-08 MED ORDER — SODIUM CHLORIDE 0.9 % IV SOLN
250.0000 mL | INTRAVENOUS | Status: DC
  Administered 2012-03-08: 250 mL via INTRAVENOUS

## 2012-03-08 MED ORDER — ISOSORBIDE MONONITRATE ER 30 MG PO TB24
30.0000 mg | ORAL_TABLET | Freq: Every day | ORAL | Status: DC
  Administered 2012-03-08 – 2012-03-11 (×4): 30 mg via ORAL
  Filled 2012-03-08 (×4): qty 1

## 2012-03-08 MED ORDER — INSULIN ASPART 100 UNIT/ML ~~LOC~~ SOLN
0.0000 [IU] | Freq: Three times a day (TID) | SUBCUTANEOUS | Status: DC
  Administered 2012-03-08: 7 [IU] via SUBCUTANEOUS
  Administered 2012-03-09: 15 [IU] via SUBCUTANEOUS
  Administered 2012-03-09: 4 [IU] via SUBCUTANEOUS
  Administered 2012-03-09 – 2012-03-10 (×2): 11 [IU] via SUBCUTANEOUS

## 2012-03-08 MED ORDER — LISINOPRIL 5 MG PO TABS
5.0000 mg | ORAL_TABLET | Freq: Every day | ORAL | Status: DC
  Administered 2012-03-08 – 2012-03-10 (×3): 5 mg via ORAL
  Filled 2012-03-08 (×3): qty 1

## 2012-03-08 NOTE — Consult Note (Signed)
[facility] - CONSULT F/U note [place] TEAM 1 - Stepdown/ICU TEAM  PCP:  DEFAULT,PROVIDER, MD  Subjective: 66 year old male who preseneds to the emergency room with chest pain off and on since Monday of this week. Associated symptoms included diaphoresis. More of right sided fullness in his chest.  In the emergency room his blood pressure on arrival was 218/139.  Consult for:  DM management  Pt is resting comfortably in a bedside chair.  He denies current cp, n/v, abdom pain, or sob.  Objective:  Intake/Output Summary (Last 24 hours) at 03/08/12 1506 Last data filed at 03/08/12 1300  Gross per 24 hour  Intake 1224.05 ml  Output   1625 ml  Net -400.95 ml   Blood pressure 135/71, pulse 102, temperature 97.9 F (36.6 C), temperature source Oral, resp. rate 25, height 5\' 8"  (1.727 m), weight 90.1 kg (198 lb 10.2 oz), SpO2 99.00%.  CBG (last 3)   Basename 03/08/12 1200 03/08/12 0807 03/07/12 2135  GLUCAP 338* 300* 253*   Physical Exam: General: No acute respiratory distress Lungs: Clear to auscultation bilaterally without wheezes or crackles Cardiovascular: Regular rate and rhythm without murmur gallop or rub  Abdomen: Nontender, nondistended, soft, bowel sounds positive, no rebound, no ascites, no appreciable mass Extremities: No significant cyanosis, clubbing, or edema bilateral lower extremities  Lab Results:  [facility] 03/08/12 0510 03/07/12 1202  NA 141 134*  K 3.5 3.6  CL 105 97  CO2 22 20  GLUCOSE 341* 352*  BUN 19 24*  CREATININE 1.33 1.55*  CALCIUM 9.2 10.1  MG 2.1 --  PHOS -- --    Basename 03/08/12 0510 03/07/12 1202  WBC 7.9 8.3  NEUTROABS -- 5.9  HGB 14.5 18.4*  HCT 40.5 51.3  MCV 77.3* 77.7*  PLT 160 154    Basename 03/08/12 0630 03/07/12 2300 03/07/12 1202  CKTOTAL 2175* 3142* --  CKMB 81.4* 146.8* --  CKMBINDEX -- -- --  TROPONINI >20.00* >20.00* 0.97*   Micro Results: Recent Results (from the past 240 hour(s))  MRSA PCR  SCREENING     Status: Normal   Collection Time   03/07/12 10:58 PM      Component Value Range Status Comment   MRSA by PCR NEGATIVE  NEGATIVE Final     Studies/Results: All recent x-ray/radiology reports have been reviewed in detail.   Medications: I have reviewed the patient's complete medication list.  Assessment/Plan:  DM 2 - uncontrolled CBG remains poorly controlled - will adjust tx plan and follow - Alc pending - ASA + ACE at time of d/c if renal fxn remains stable/allows continued use - may be a candidate for oral meds, but choice will depend on cardiac status, renal status, and $ concerns - follow   Acute renal failure Improving with volume expansion - follow trend given recent contrast for cath  USAP / NSTEMI Care per primary team [facility])  Malignant HTN / HTN emergency / HTN crisis Care as per primary team [facility])  Coronary artery bypass grafting x4 2009 LIMA to the LAD vein graft to the diagonal branch and saphenous vein graft to the circumflex branch and saphenous vein graft to distal right coronary artery  Dispo As per primary team - we will cont to follow along with you   Cherene Altes, MD [facility] Office  [phone] Pager [phone]  On-Call/Text Page:      [email or web address]      password [facility]

## 2012-03-08 NOTE — Progress Notes (Addendum)
[facility] DAILY PROGRESS NOTE  NAME:  [name]   MRN: [id number] DOB:  04/23/1946   ADMIT DATE: 03/07/2012   Patient Description   66 y.o. male with PMH below: presented with Hypertensive Emergency, and NSTEMI -- taken urgently to the cath lab -- see report. Also with mild acute renal failure & hyperglycemia that appears to have been volume contraction.    Past Medical History  Diagnosis Date  . Coronary artery disease   . Diabetes mellitus   . Arthritis   . Hypertension   . Hypertensive crisis 03/07/2012  . Unstable angina 03/07/2012  . DM (diabetes mellitus),poorly controlled 03/07/2012  . CAD (coronary artery disease), with CABG in 2009 after an MI 03/07/2012  . Myocardial infarction     Clinical Course:  Cath yesterday: with PCI of SVG-OM. Post PCI Troponins > 20 x 2 but CK & CKMB are trending down.  Length of Stay:  LOS: 1 day   Subjective:   Today [name] feels much better.  No further CP.  Breathing fine  Objective:  Temp:  [98 F (36.7 C)-98.4 F (36.9 C)] 98 F (36.7 C) (06/29 0804) Pulse Rate:  [80-128] 85  (06/29 0600) Resp:  [7-27] 18  (06/29 0600) BP: (105-233)/(60-156) 116/62 mmHg (06/29 0600) SpO2:  [96 %-100 %] 99 % (06/29 0600) Weight:  [81.647 kg (180 lb)-90.1 kg (198 lb 10.2 oz)] 90.1 kg (198 lb 10.2 oz) (06/28 1845) Weight change:  Physical Exam: General appearance: alert, cooperative, appears stated age, no distress and Pleasant mood & affect. Neck: no adenopathy, no carotid bruit, no JVD, supple, symmetrical, trachea midline and thyroid not enlarged, symmetric, no tenderness/mass/nodules Lungs: clear to auscultation bilaterally, normal percussion bilaterally and non-labored Heart: regular rate and rhythm, S1, S2 normal, S4 present and no M/R Abdomen: soft, non-tender; bowel sounds normal; no masses,  no organomegaly Extremities: extremities normal, atraumatic, no cyanosis or edema Pulses: 2+ and  symmetric Neurologic: Grossly normal Groin C/D/I - no bruit.    Intake/Output from previous day: 06/28 0701 - 06/29 0700 In: 1027.1 [P.O.:240; I.V.:787.1] Out: 900 [Urine:900]  Intake/Output Summary (Last 24 hours) at 03/08/12 0850 Last data filed at 03/08/12 0600  Gross per 24 hour  Intake 1027.08 ml  Output    900 ml  Net 127.08 ml    Results for orders placed since admission  TROPONIN I     Status: Abnormal   Collection Time   03/07/12 12:02 PM      Component Value Range   Troponin I 0.97 (*) <0.30 ng/mL  URINALYSIS, ROUTINE W REFLEX MICROSCOPIC     Status: Abnormal   Collection Time   03/07/12 12:27 PM      Component Value Range   Color, Urine YELLOW  YELLOW   APPearance CLEAR  CLEAR   Specific Gravity, Urine 1.036 (*) 1.005 - 1.030   pH 5.5  5.0 - 8.0   Glucose, UA >1000 (*) NEGATIVE mg/dL   Hgb urine dipstick SMALL (*) NEGATIVE   Bilirubin Urine NEGATIVE  NEGATIVE   Ketones, ur 15 (*) NEGATIVE mg/dL   Protein, ur >300 (*) NEGATIVE mg/dL   Urobilinogen, UA 0.2  0.0 - 1.0 mg/dL   Nitrite NEGATIVE  NEGATIVE   Leukocytes, UA NEGATIVE  NEGATIVE  URINE MICROSCOPIC-ADD ON     Status: Abnormal   Collection Time   03/07/12 12:27 PM      Component Value Range   Squamous Epithelial / LPF FEW (*)  RARE   WBC, UA 0-2  <3 WBC/hpf   Bacteria, UA RARE  RARE   Casts GRANULAR CAST (*) NEGATIVE  BASIC METABOLIC PANEL     Status: Abnormal   Collection Time   03/08/12  5:10 AM      Component Value Range   Sodium 141  135 - 145 mEq/L   Potassium 3.5  3.5 - 5.1 mEq/L   Chloride 105  96 - 112 mEq/L   CO2 22  19 - 32 mEq/L   Glucose, Bld 341 (*) 70 - 99 mg/dL   BUN 19  6 - 23 mg/dL   Creatinine, Ser 1.33  0.50 - 1.35 mg/dL   Calcium 9.2  8.4 - 10.5 mg/dL   GFR calc non Af Amer 55 (*) >90 mL/min   GFR calc Af Amer 64 (*) >90 mL/min  CARDIAC PANEL(CRET KIN+CKTOT+MB+TROPI)     Status: Abnormal   Collection Time   03/08/12  6:30 AM      Component Value Range   Total CK 2175 (*) 7  - 232 U/L   CK, MB 81.4 (*) 0.3 - 4.0 ng/mL   Troponin I >20.00 (*) <0.30 ng/mL   Relative Index 3.7 (*) 0.0 - 2.5  GLUCOSE, CAPILLARY     Status: Abnormal   Collection Time   03/08/12  8:07 AM      Component Value Range   Glucose-Capillary 300 (*) 70 - 99 mg/dL    ECG: NSR, Inf Qs (Inf Infarct, old) with LAD, LAA. No significant change.  POST-CATH DIAGNOSIS:   Severe multivessel CAD with the acute lesion being a distal occlusion of the SVG-OM   S/p Successful Aspiration Thrombectomy, PTCA and PCI of the distal SVG-OM with a Multi-LInk Vision BMS 2.5 mm x 15 mm - post dilated to 2.62mm   Severe multisite disease of the SVG-RCA with distal RPDA 95% stenosis   Severe distal LAD stenosis after widely patent LIMA insertion   Widely Patent SVG-Diag  MAR Reviewed  Assessment/Plan:   Principal Problem:  *NSTEMI, initial episode of care - Occluded SVG-OM, s/p PCI Active Problems:  Hypertensive crisis  DM (diabetes mellitus),poorly controlled  CAD (coronary artery disease), with CABG in 2009 after an MI   CAD: continue with DAPT, will review images to make plans for staged PCI of SVG-RCA & RPDA as well as potentially of distal LAD.  Will continue IV Heparin for now given the extent of remaining disease.  Wean IV NTG - for Imdur & ACE-I.  Hypertension has dramatically improved - Carvedilol ordered  DM- have consulted [facility], thank you for the assistance.  Lantus & QAC insulin ordered.  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 may need to cut back Lantus  On Statin.  Will order PPI for GI prophylaxis.  Once off IV NTG - can transfer to Telemetry.  He will need a primary care provider upon discharge.  Has an issue with Health Insurance - was covered on his Wife's plan, but no longer covered after her retiring. CM consultation to assist with DAPT.  Time Spent with Patient & Chart:  20 minutes   Gerarda Conklin W, M.D., M.S. [facility] [id number] [place], [place]  [address]  [phone]  03/08/2012 8:50 AM

## 2012-03-08 NOTE — Progress Notes (Signed)
ANTICOAGULATION CONSULT NOTE - Follow Up Consult  Pharmacy Consult for heparin Indication: CAD  No Known Allergies  Patient Measurements: Height: 5\' 8"  (172.7 cm) Weight: 198 lb 10.2 oz (90.1 kg) IBW/kg (Calculated) : 68.4    Vital Signs: Temp: 99.7 F (37.6 C) (06/29 2000) Temp src: Oral (06/29 2000) BP: 108/70 mmHg (06/29 2000) Pulse Rate: 100  (06/29 2000)  Labs:  Basename 03/08/12 2100 03/08/12 1331 03/08/12 0630 03/08/12 0510 03/07/12 2300 03/07/12 1202  HGB -- -- -- 14.5 -- 18.4*  HCT -- -- -- 40.5 -- 51.3  PLT -- -- -- 160 -- 154  APTT -- -- -- -- -- --  LABPROT -- -- -- -- -- --  INR -- -- -- -- -- --  HEPARINUNFRC 0.50 0.16* -- -- -- --  CREATININE -- -- -- 1.33 -- 1.55*  CKTOTAL -- -- 2175* -- 3142* --  CKMB -- -- 81.4* -- 146.8* --  TROPONINI -- -- >20.00* -- >20.00* 0.97*    Estimated Creatinine Clearance: 62 ml/min (by C-G formula based on Cr of 1.33).  Assessment: Patient is a 66 y.o. s/p cath and PCI on heparin for CAD. Heparin level is therapeutic on 1600 units/hr.    Goal of Therapy:  Heparin level 0.3-0.7 units/ml Monitor platelets by anticoagulation protocol: Yes   Plan:  1) Continue heparin drip at 1600 units/hr 2) Follow-up with AM heparin level and CBC   Romona Murdy, Pharm.D., BCPS Clinical Pharmacist Pager [phone] 03/08/2012 9:55 PM

## 2012-03-08 NOTE — Progress Notes (Signed)
CARDIAC REHAB PHASE I   PRE:  Rate/Rhythm: 93 SR PVC's  BP:  Supine:   Sitting: 102/68  Standing:    SaO2: 98 RA  MODE:  Ambulation: 350 ft   POST:  Rate/Rhythem: 104 ST  BP:  Supine:   Sitting: 129/78  Standing:    SaO2: 97 RA 1113-1155 Tolerated ambulation well without c/o of cp or SOB. VS stable Pt back to recliner after walk with call light in reach. Gave pt MI booklet, stent card and booklet and information on Effient. He has no insurance and he is concerned  about being able to afford medications.   Deon Pilling

## 2012-03-08 NOTE — Progress Notes (Signed)
ANTICOAGULATION CONSULT NOTE - Follow Up Consult  Pharmacy Consult for heparin Indication: CAD  No Known Allergies  Patient Measurements: Height: 5\' 8"  (172.7 cm) Weight: 198 lb 10.2 oz (90.1 kg) IBW/kg (Calculated) : 68.4    Vital Signs: Temp: 97.9 F (36.6 C) (06/29 1200) Temp src: Oral (06/29 1200) BP: 135/71 mmHg (06/29 1300) Pulse Rate: 102  (06/29 1300)  Labs:  Basename 03/08/12 1331 03/08/12 0630 03/08/12 0510 03/07/12 2300 03/07/12 1202  HGB -- -- 14.5 -- 18.4*  HCT -- -- 40.5 -- 51.3  PLT -- -- 160 -- 154  APTT -- -- -- -- --  LABPROT -- -- -- -- --  INR -- -- -- -- --  HEPARINUNFRC 0.16* -- -- -- --  CREATININE -- -- 1.33 -- 1.55*  CKTOTAL -- 2175* -- 3142* --  CKMB -- 81.4* -- 146.8* --  TROPONINI -- >20.00* -- >20.00* 0.97*    Estimated Creatinine Clearance: 62 ml/min (by C-G formula based on Cr of 1.33).  Assessment: Patient is a 66 y.o. s/p cath and PCI on heparin for CAD. First heparin level now back subtherapeutic.  No issues noted with IV line.  Goal of Therapy:  Heparin level 0.3-0.7 units/ml Monitor platelets by anticoagulation protocol: Yes   Plan:  1) increase heparin drip to 1600 units/hr 2) check 6 hour heparin level   Ester Hilley P 03/08/2012,2:33 PM

## 2012-03-09 DIAGNOSIS — E1165 Type 2 diabetes mellitus with hyperglycemia: Secondary | ICD-10-CM

## 2012-03-09 DIAGNOSIS — D751 Secondary polycythemia: Secondary | ICD-10-CM

## 2012-03-09 DIAGNOSIS — IMO0001 Reserved for inherently not codable concepts without codable children: Secondary | ICD-10-CM

## 2012-03-09 DIAGNOSIS — N179 Acute kidney failure, unspecified: Secondary | ICD-10-CM

## 2012-03-09 LAB — HEPARIN LEVEL (UNFRACTIONATED): Heparin Unfractionated: 0.74 IU/mL — ABNORMAL HIGH (ref 0.30–0.70)

## 2012-03-09 LAB — CBC
HCT: 41.6 % (ref 39.0–52.0)
MCHC: 35.1 g/dL (ref 30.0–36.0)
MCV: 78.8 fL (ref 78.0–100.0)
Platelets: 143 10*3/uL — ABNORMAL LOW (ref 150–400)
RDW: 13.9 % (ref 11.5–15.5)
WBC: 7.1 10*3/uL (ref 4.0–10.5)

## 2012-03-09 LAB — URINE CULTURE
Colony Count: NO GROWTH
Culture: NO GROWTH

## 2012-03-09 LAB — BASIC METABOLIC PANEL
BUN: 19 mg/dL (ref 6–23)
Chloride: 106 mEq/L (ref 96–112)
Creatinine, Ser: 1.29 mg/dL (ref 0.50–1.35)
GFR calc Af Amer: 67 mL/min — ABNORMAL LOW (ref >90)
GFR calc non Af Amer: 57 mL/min — ABNORMAL LOW (ref >90)
Potassium: 3.2 mEq/L — ABNORMAL LOW (ref 3.5–5.1)

## 2012-03-09 LAB — GLUCOSE, CAPILLARY
Glucose-Capillary: 264 mg/dL — ABNORMAL HIGH (ref 70–99)
Glucose-Capillary: 158 mg/dL — ABNORMAL HIGH (ref 70–99)

## 2012-03-09 MED ORDER — PANTOPRAZOLE SODIUM 40 MG PO TBEC
40.0000 mg | DELAYED_RELEASE_TABLET | Freq: Every day | ORAL | Status: DC
  Administered 2012-03-09: 40 mg via ORAL
  Filled 2012-03-09: qty 1

## 2012-03-09 MED ORDER — INSULIN GLARGINE 100 UNIT/ML ~~LOC~~ SOLN
20.0000 [IU] | Freq: Every day | SUBCUTANEOUS | Status: DC
  Administered 2012-03-09: 10 [IU] via SUBCUTANEOUS

## 2012-03-09 MED ORDER — INSULIN ASPART 100 UNIT/ML ~~LOC~~ SOLN
4.0000 [IU] | Freq: Three times a day (TID) | SUBCUTANEOUS | Status: DC
  Administered 2012-03-09 (×2): 4 [IU] via SUBCUTANEOUS

## 2012-03-09 MED ORDER — CARVEDILOL 12.5 MG PO TABS
12.5000 mg | ORAL_TABLET | Freq: Two times a day (BID) | ORAL | Status: DC
  Administered 2012-03-09 – 2012-03-11 (×5): 12.5 mg via ORAL
  Filled 2012-03-09 (×6): qty 1

## 2012-03-09 MED ORDER — POTASSIUM CHLORIDE CRYS ER 20 MEQ PO TBCR
40.0000 meq | EXTENDED_RELEASE_TABLET | Freq: Once | ORAL | Status: AC
  Administered 2012-03-09: 40 meq via ORAL
  Filled 2012-03-09: qty 2

## 2012-03-09 NOTE — Progress Notes (Signed)
  Echocardiogram 2D Echocardiogram has been performed.  Tita Terhaar 03/09/2012, 11:18 AM

## 2012-03-09 NOTE — Consult Note (Signed)
[facility] - CONSULT F/U note [place] TEAM 1 - Stepdown/ICU TEAM  PCP:  DEFAULT,PROVIDER, MD  Subjective: 66 year old male who presented to the emergency room with chest pain off and on since Monday of this week. Associated symptoms included diaphoresis. In the emergency room his blood pressure on arrival was 218/139.  Consult for:  DM management  Pt is resting comfortably in a bedside chair.  He is in good spirits this morning, and reports that he feels much better.  He denies current cp, n/v, abdom pain, or sob.  Objective:  Intake/Output Summary (Last 24 hours) at 03/09/12 0934 Last data filed at 03/09/12 0700  Gross per 24 hour  Intake  791.2 ml  Output   1350 ml  Net -558.8 ml   Blood pressure 141/102, pulse 95, temperature 98.2 F (36.8 C), temperature source Oral, resp. rate 8, height 5\' 8"  (1.727 m), weight 91.7 kg (202 lb 2.6 oz), SpO2 99.00%.  CBG (last 3)   Basename 03/09/12 0802 03/08/12 2136 03/08/12 1739  GLUCAP 304* 265* 245*   Physical Exam: General: No acute respiratory distress Lungs: Clear to auscultation bilaterally without wheezes or crackles Cardiovascular: Regular rate and rhythm without murmur gallop or rub  Abdomen: Nontender, nondistended, soft, bowel sounds positive, no rebound, no ascites, no appreciable mass Extremities: No significant cyanosis, clubbing, or edema B lower extremities  Lab Results:  Basename 03/09/12 0555 03/08/12 0510 03/07/12 1202  NA 139 141 134*  K 3.2* 3.5 3.6  CL 106 105 97  CO2 21 22 20   GLUCOSE 269* 341* 352*  BUN 19 19 24*  CREATININE 1.29 1.33 1.55*  CALCIUM 9.2 9.2 10.1  MG -- 2.1 --  PHOS -- -- --    Basename 03/09/12 0555 03/08/12 0510 03/07/12 1202  WBC 7.1 7.9 8.3  NEUTROABS -- -- 5.9  HGB 14.6 14.5 18.4*  HCT 41.6 40.5 51.3  MCV 78.8 77.3* 77.7*  PLT 143* 160 154    Basename 03/08/12 0630 03/07/12 2300 03/07/12 1202  CKTOTAL 2175* 3142* --  CKMB 81.4* 146.8* --  CKMBINDEX -- -- --    TROPONINI >20.00* >20.00* 0.97*   Micro Results: Recent Results (from the past 240 hour(s))  URINE CULTURE     Status: Normal   Collection Time   03/07/12 12:27 PM      Component Value Range Status Comment   Specimen Description URINE, CLEAN CATCH   Final    Special Requests NONE   Final    Culture  Setup Time 03/08/2012 01:25   Final    Colony Count NO GROWTH   Final    Culture NO GROWTH   Final    Report Status 03/09/2012 FINAL   Final   MRSA PCR SCREENING     Status: Normal   Collection Time   03/07/12 10:58 PM      Component Value Range Status Comment   MRSA by PCR NEGATIVE  NEGATIVE Final     Studies/Results: All recent x-ray/radiology reports have been reviewed in detail.   Medications: I have reviewed the patient's complete medication list.  Assessment/Plan:  DM 2 - uncontrolled CBG remains poorly controlled - will adjust tx plan further (increase lantus and add meal coverage) and continue to follow - Alc 11.6 - ASA + ACE at time of d/c if renal fxn remains stable/allows continued use - may be a candidate for oral meds, but choice will depend on cardiac status, renal status, and $ concerns - follow   Acute  renal failure Continues to improve with volume expansion - follow trend given recent contrast for cath  Hypokalemia  Replace - follow trend - check Mg level  USAP / NSTEMI Care per primary team [facility])  Malignant HTN / HTN emergency / HTN crisis Care as per primary team [facility])  Coronary artery bypass grafting x4 2009 LIMA to the LAD vein graft to the diagonal branch and saphenous vein graft to the circumflex branch and saphenous vein graft to distal right coronary artery  Dispo As per primary team - we will cont to follow along with you   Cherene Altes, MD [facility] Office  [phone] Pager [phone]  On-Call/Text Page:      [email or web address]      password [facility]

## 2012-03-09 NOTE — Progress Notes (Signed)
ANTICOAGULATION CONSULT NOTE - Follow Up Consult  Pharmacy Consult for heparin Indication: CAD  No Known Allergies  Patient Measurements: Height: 5\' 8"  (172.7 cm) Weight: 202 lb 2.6 oz (91.7 kg) IBW/kg (Calculated) : 68.4    Vital Signs: Temp: 98 F (36.7 C) (06/30 0000) Temp src: Oral (06/30 0000) BP: 141/102 mmHg (06/30 0700) Pulse Rate: 95  (06/30 0700)  Labs:  Basename 03/09/12 0555 03/08/12 2100 03/08/12 1331 03/08/12 0630 03/08/12 0510 03/07/12 2300 03/07/12 1202  HGB 14.6 -- -- -- 14.5 -- --  HCT 41.6 -- -- -- 40.5 -- 51.3  PLT 143* -- -- -- 160 -- 154  APTT -- -- -- -- -- -- --  LABPROT -- -- -- -- -- -- --  INR -- -- -- -- -- -- --  HEPARINUNFRC 0.74* 0.50 0.16* -- -- -- --  CREATININE 1.29 -- -- -- 1.33 -- 1.55*  CKTOTAL -- -- -- 2175* -- 3142* --  CKMB -- -- -- 81.4* -- 146.8* --  TROPONINI -- -- -- >20.00* -- >20.00* 0.97*    Estimated Creatinine Clearance: 64.4 ml/min (by C-G formula based on Cr of 1.29).   Assessment: Patient is a 66 y.o M s/p cath and PCI on heparin for CAD.  No bleeding noted.  Heparin level is slightly above goal range this morning.     Goal of Therapy:  Heparin level 0.3-0.7 units/ml Monitor platelets by anticoagulation protocol: Yes   Plan:  1) decrease heparin drip to 1450 units/hr 2) f/u with Am labs and adjust as needed  Orilla Templeman P 03/09/2012,7:26 AM

## 2012-03-09 NOTE — Progress Notes (Signed)
[facility] Patient Description   66 y.o. male with PMH below: presented with Hypertensive Emergency, and NSTEMI -- taken urgently to the cath lab -- see report.  Also with mild acute renal failure & hyperglycemia that appears to have been volume contraction.     Subjective: No complaints.  Slept very well.  He was up in the bathroom washing up.  Objective: Vital signs in last 24 hours: Temp:  [97.9 F (36.6 C)-99.7 F (37.6 C)] 98 F (36.7 C) (06/30 0000) Pulse Rate:  [82-110] 95  (06/30 0700) Resp:  [8-27] 8  (06/30 0700) BP: (93-170)/(56-102) 141/102 mmHg (06/30 0700) SpO2:  [93 %-100 %] 99 % (06/30 0700) Weight:  [91.7 kg (202 lb 2.6 oz)] 91.7 kg (202 lb 2.6 oz) (06/30 0600)    Intake/Output from previous day: 06/29 0701 - 06/30 0700 In: 857.2 [P.O.:200; I.V.:657.2] Out: 1800 [Urine:1800] Intake/Output this shift:    Medications Current Facility-Administered Medications  Medication Dose Route Frequency Provider Last Rate Last Dose  . 0.9 %  sodium chloride infusion  250 mL Intravenous Continuous Pixie Casino, MD 10 mL/hr at 03/08/12 2000 250 mL at 03/08/12 2000  . acetaminophen (TYLENOL) tablet 650 mg  650 mg Oral Q4H PRN Leonie Man, MD      . aspirin chewable tablet 81 mg  81 mg Oral Daily Leonie Man, MD   81 mg at 03/08/12 1121  . atorvastatin (LIPITOR) tablet 80 mg  80 mg Oral q1800 Leonie Man, MD   80 mg at 03/08/12 2140  . carvedilol (COREG) tablet 6.25 mg  6.25 mg Oral BID WC Leonie Man, MD   6.25 mg at 03/09/12 0729  . heparin ADULT infusion 100 units/mL (25000 units/250 mL)  1,450 Units/hr Intravenous Continuous Anh P Pham, PHARMD 16 mL/hr at 03/09/12 0220 1,600 Units/hr at 03/09/12 0220  . insulin aspart (novoLOG) injection 0-20 Units  0-20 Units Subcutaneous TID WC Cherene Altes, MD   7 Units at 03/08/12 1743  . insulin aspart (novoLOG) injection 0-5 Units  0-5 Units Subcutaneous QHS Cherene Altes, MD    3 Units at 03/08/12 2137  . insulin glargine (LANTUS) injection 10 Units  10 Units Subcutaneous QHS Cherene Altes, MD   10 Units at 03/08/12 2140  . isosorbide mononitrate (IMDUR) 24 hr tablet 30 mg  30 mg Oral Daily Leonie Man, MD   30 mg at 03/08/12 1121  . lisinopril (PRINIVIL,ZESTRIL) tablet 5 mg  5 mg Oral Daily Leonie Man, MD   5 mg at 03/08/12 1122  . morphine 2 MG/ML injection 2 mg  2 mg Intravenous Q1H PRN Leonie Man, MD      . nitroGLYCERIN 0.2 mg/mL in dextrose 5 % infusion  2-200 mcg/min Intravenous Continuous Leonie Man, MD 3 mL/hr at 03/08/12 1300 10 mcg/min at 03/08/12 1300  . ondansetron (ZOFRAN) injection 4 mg  4 mg Intravenous Q6H PRN Leonie Man, MD      . prasugrel (EFFIENT) tablet 10 mg  10 mg Oral Daily Leonie Man, MD   10 mg at 03/08/12 1030  . DISCONTD: bivalirudin (ANGIOMAX) 5 mg/mL in sodium chloride 0.9 % 50 mL infusion  0.25 mg/kg/hr Intravenous Continuous Leonie Man, MD   0.25 mg/kg/hr at 03/07/12 1900  . DISCONTD: insulin aspart (novoLOG) injection 0-15 Units  0-15 Units Subcutaneous TID WC Phillips Climes, MD   11 Units at 03/08/12 1226  . DISCONTD:  insulin glargine (LANTUS) injection 5 Units  5 Units Subcutaneous QHS Dawood Elgergawy, MD      . DISCONTD: sodium chloride 0.9 % injection 3 mL  3 mL Intravenous Q12H Leonie Man, MD   3 mL at 03/08/12 1030  . DISCONTD: sodium chloride 0.9 % injection 3 mL  3 mL Intravenous PRN Leonie Man, MD        PE: General appearance: alert, cooperative and no distress Lungs: clear to auscultation bilaterally Heart: Irregular rate.  Rate elevated.  No MM Extremities: No LEE Pulses: Radials 2+ and symmetric Neuro: Grossly Normal  Lab Results:   Basename 03/09/12 0555 03/08/12 0510 03/07/12 1202  WBC 7.1 7.9 8.3  HGB 14.6 14.5 18.4*  HCT 41.6 40.5 51.3  PLT 143* 160 154   BMET  Basename 03/09/12 0555 03/08/12 0510 03/07/12 1202  NA 139 141 134*  K 3.2* 3.5 3.6  CL 106  105 97  CO2 21 22 20   GLUCOSE 269* 341* 352*  BUN 19 19 24*  CREATININE 1.29 1.33 1.55*  CALCIUM 9.2 9.2 10.1   PT/INR No results found for this basename: LABPROT:3,INR:3 in the last 72 hours Cholesterol No results found for this basename: CHOL in the last 72 hours  Cardiac Panel (last 3 results)  Basename 03/08/12 0630 03/07/12 2300 03/07/12 1202  CKTOTAL 2175* 3142* --  CKMB 81.4* 146.8* --  TROPONINI >20.00* >20.00* 0.97*  RELINDX 3.7* 4.7* --    Studies/Results: @RISRSLT2 @   Assessment/Plan  Principal Problem:  *NSTEMI, initial episode of care - Occluded SVG-OM, s/p PCI Active Problems:  Hypertensive crisis  DM (diabetes mellitus),poorly controlled  CAD (coronary artery disease), with CABG in 2009 after an MI Tachycardia  Plan:  No further CP.  Ambulating without difficulty. -S/P left heart cath:Severe multivessel CAD with the acute lesion being a distal occlusion of the SVG-OM  S/p Successful Aspiration Thrombectomy, PTCA and PCI of the distal SVG-OM with a Multi-LInk Vision BMS 2.5 mm x 15 mm - post dilated to 2.41mm  Severe multisite disease of the SVG-RCA with distal RPDA 95% stenosis  Severe distal LAD stenosis after widely patent LIMA insertion  Widely Patent SVG-Diag  -Continue with DAPT, will review images to make plans for staged PCI of SVG-RCA & RPDA as well as potentially of distal LAD. -Tachycardia. Multiforme  PVCs on telemetry.  HR elevated beyond what you would expect for activity level(washing up in bathroom).  Recommend increasing Coreg. -IM helping with DM.   LOS: 2 days    [name], [name] 03/09/2012 7:52 AM  ATTENDING ATTESTATION:  I have seen and examined the patient along with Kyle Fuller, PA.  I have reviewed the chart, notes and new data.  I agree with [name]'s findings, examination and recommendations.  [name] looks great today.  He has not had any further episodes of angina after restoration of flow in the occluded SVG-OM.  Renal  function is stable.  Echo ordered.   He does have severe stenoses in the SVG-RCA and RPDA as well as LAD.  I have reviewed these films with colleagues, and feel that we should proceed with staged PCI of the RCA graft and PDA as the PDA lesion is indeed 90+ %.  The LAD appears to be almost chronic total occlusion.    Pending stable renal function, will plan for PCI tomorrow -- he continues to be on IV Heparin due to the severity of these lesions.  Continue DAPT   Wean off NTG gtt  with Imdur started   His BP & HR remain favorable for uptitrating his BB.  I have increased the dose today to 12.5 mg bid.  Appreciate [facility] assistance with DM care.  Lantus + SSI.  Will need to establish discharge regimen.  Statin initiated on admission -- will f/u labs as OP  CM,SW & Cardiac Rehab consultants are assisting.  PPI for GI prophylaxis    Astin Sayre W, M.D., M.S. [facility] [address] [place], [place]  [address]  [phone]  03/09/2012 8:23 AM

## 2012-03-10 ENCOUNTER — Encounter (HOSPITAL_COMMUNITY)
Admission: EM | Disposition: A | Discharge status: Home / Self Care | Payer: Self-pay | Source: Home / Self Care | Attending: Internal Medicine

## 2012-03-10 DIAGNOSIS — E119 Type 2 diabetes mellitus without complications: Secondary | ICD-10-CM

## 2012-03-10 DIAGNOSIS — I10 Essential (primary) hypertension: Secondary | ICD-10-CM

## 2012-03-10 DIAGNOSIS — E785 Hyperlipidemia, unspecified: Secondary | ICD-10-CM | POA: Diagnosis present

## 2012-03-10 DIAGNOSIS — R7309 Other abnormal glucose: Secondary | ICD-10-CM

## 2012-03-10 HISTORY — PX: PERCUTANEOUS CORONARY STENT INTERVENTION (PCI-S): SHX5485

## 2012-03-10 LAB — BASIC METABOLIC PANEL
BUN: 17 mg/dL (ref 6–23)
CO2: 20 mEq/L (ref 19–32)
Calcium: 9.3 mg/dL (ref 8.4–10.5)
Creatinine, Ser: 1.28 mg/dL (ref 0.50–1.35)
GFR calc non Af Amer: 58 mL/min — ABNORMAL LOW (ref >90)
Glucose, Bld: 168 mg/dL — ABNORMAL HIGH (ref 70–99)

## 2012-03-10 LAB — CBC
Hemoglobin: 14.7 g/dL (ref 13.0–17.0)
MCH: 27.2 pg (ref 26.0–34.0)
MCHC: 34.1 g/dL (ref 30.0–36.0)
MCV: 79.8 fL (ref 78.0–100.0)
RBC: 5.4 MIL/uL (ref 4.22–5.81)

## 2012-03-10 LAB — PROTIME-INR: Prothrombin Time: 15.2 seconds (ref 11.6–15.2)

## 2012-03-10 LAB — GLUCOSE, CAPILLARY
Glucose-Capillary: 134 mg/dL — ABNORMAL HIGH (ref 70–99)
Glucose-Capillary: 173 mg/dL — ABNORMAL HIGH (ref 70–99)

## 2012-03-10 LAB — POCT ACTIVATED CLOTTING TIME: Activated Clotting Time: 484 seconds

## 2012-03-10 SURGERY — PERCUTANEOUS CORONARY STENT INTERVENTION (PCI-S)
Anesthesia: LOCAL

## 2012-03-10 MED ORDER — INSULIN NPH (HUMAN) (ISOPHANE) 100 UNIT/ML ~~LOC~~ SUSP
20.0000 [IU] | Freq: Two times a day (BID) | SUBCUTANEOUS | Status: DC
  Administered 2012-03-11: 20 [IU] via SUBCUTANEOUS
  Filled 2012-03-10: qty 10

## 2012-03-10 MED ORDER — INSULIN ASPART 100 UNIT/ML ~~LOC~~ SOLN: 0.0000 [IU] | Freq: Every day | SUBCUTANEOUS | Status: DC

## 2012-03-10 MED ORDER — SODIUM CHLORIDE 0.9 % IJ SOLN
3.0000 mL | Freq: Two times a day (BID) | INTRAMUSCULAR | Status: DC
  Administered 2012-03-10: 6 mL via INTRAVENOUS
  Administered 2012-03-11: 3 mL via INTRAVENOUS

## 2012-03-10 MED ORDER — ONDANSETRON HCL 4 MG/2ML IJ SOLN: 4.0000 mg | Freq: Four times a day (QID) | INTRAMUSCULAR | Status: DC | PRN

## 2012-03-10 MED ORDER — LISINOPRIL 10 MG PO TABS
10.0000 mg | ORAL_TABLET | Freq: Every day | ORAL | Status: DC
  Administered 2012-03-11: 10 mg via ORAL
  Filled 2012-03-10: qty 1

## 2012-03-10 MED ORDER — INSULIN ASPART 100 UNIT/ML ~~LOC~~ SOLN: 0.0000 [IU] | Freq: Three times a day (TID) | SUBCUTANEOUS | Status: DC

## 2012-03-10 MED ORDER — SODIUM CHLORIDE 0.9 % IJ SOLN: 3.0000 mL | Freq: Two times a day (BID) | INTRAMUSCULAR | Status: DC

## 2012-03-10 MED ORDER — MORPHINE SULFATE 2 MG/ML IJ SOLN: 2.0000 mg | INTRAMUSCULAR | Status: DC | PRN

## 2012-03-10 MED ORDER — BIVALIRUDIN 250 MG IV SOLR
INTRAVENOUS | Status: AC
  Filled 2012-03-10: qty 250

## 2012-03-10 MED ORDER — SODIUM CHLORIDE 0.9 % IV SOLN
0.2500 mg/kg/h | INTRAVENOUS | Status: DC
  Filled 2012-03-10 (×3): qty 250

## 2012-03-10 MED ORDER — MIDAZOLAM HCL 2 MG/2ML IJ SOLN
INTRAMUSCULAR | Status: AC
  Filled 2012-03-10: qty 2

## 2012-03-10 MED ORDER — FENTANYL CITRATE 0.05 MG/ML IJ SOLN
INTRAMUSCULAR | Status: AC
  Filled 2012-03-10: qty 2

## 2012-03-10 MED ORDER — SODIUM CHLORIDE 0.9 % IJ SOLN: 3.0000 mL | INTRAMUSCULAR | Status: DC | PRN

## 2012-03-10 MED ORDER — HEPARIN (PORCINE) IN NACL 2-0.9 UNIT/ML-% IJ SOLN
INTRAMUSCULAR | Status: AC
  Filled 2012-03-10: qty 2000

## 2012-03-10 MED ORDER — INSULIN ASPART 100 UNIT/ML ~~LOC~~ SOLN
0.0000 [IU] | Freq: Three times a day (TID) | SUBCUTANEOUS | Status: DC
  Administered 2012-03-10: 2 [IU] via SUBCUTANEOUS
  Administered 2012-03-11: 3 [IU] via SUBCUTANEOUS

## 2012-03-10 MED ORDER — GLIPIZIDE 10 MG PO TABS
10.0000 mg | ORAL_TABLET | Freq: Every day | ORAL | Status: DC
  Administered 2012-03-11: 10 mg via ORAL
  Filled 2012-03-10 (×3): qty 1

## 2012-03-10 MED ORDER — SODIUM CHLORIDE 0.9 % IV SOLN: 1.0000 mL/kg/h | INTRAVENOUS | Status: AC

## 2012-03-10 MED ORDER — ACETAMINOPHEN 325 MG PO TABS: 650.0000 mg | ORAL_TABLET | ORAL | Status: DC | PRN

## 2012-03-10 MED ORDER — ASPIRIN 81 MG PO CHEW
324.0000 mg | CHEWABLE_TABLET | ORAL | Status: AC
  Administered 2012-03-10: 324 mg via ORAL

## 2012-03-10 MED ORDER — SODIUM CHLORIDE 0.9 % IV SOLN: 250.0000 mL | INTRAVENOUS | Status: DC

## 2012-03-10 MED ORDER — SODIUM CHLORIDE 0.9 % IV SOLN: 250.0000 mL | INTRAVENOUS | Status: DC | PRN

## 2012-03-10 MED ORDER — NITROGLYCERIN 0.2 MG/ML ON CALL CATH LAB
INTRAVENOUS | Status: AC
  Filled 2012-03-10: qty 1

## 2012-03-10 MED ORDER — LIDOCAINE HCL (PF) 1 % IJ SOLN
INTRAMUSCULAR | Status: AC
  Filled 2012-03-10: qty 30

## 2012-03-10 MED ORDER — POTASSIUM CHLORIDE CRYS ER 20 MEQ PO TBCR
40.0000 meq | EXTENDED_RELEASE_TABLET | Freq: Once | ORAL | Status: AC
  Administered 2012-03-10: 40 meq via ORAL
  Filled 2012-03-10: qty 2

## 2012-03-10 MED ORDER — SODIUM CHLORIDE 0.9 % IV SOLN: 1.0000 mL/kg/h | INTRAVENOUS | Status: DC

## 2012-03-10 MED FILL — Dextrose Inj 5%: INTRAVENOUS | Qty: 50 | Status: AC

## 2012-03-10 NOTE — Progress Notes (Signed)
CARDIAC REHAB PHASE I   PRE:  Rate/Rhythm: 97 SR  BP:  Supine:   Sitting: 148/109  Standing:    SaO2:   MODE:  Ambulation: 1050 ft   POST:  Rate/Rhythem: 113 ST PVC's   BP:  Supine:   Sitting: 150/99  Standing:    SaO2:  [id number] Tolerated ambulation well without c/o of cp or SOB. BP up before and after walk. Pt back to recliner after walk with call light in reach. Stated MI education with pt. He admits to not taking any of his medications for 1-2 years due to no insurance. Also he has no primary care physician.Pt  is concerned about being able to afford his medications. Discussed with his RN and ask her to place a case manger consult.  Deon Pilling

## 2012-03-10 NOTE — Consult Note (Signed)
[facility] - CONSULT F/U note [place] TEAM 1 - Stepdown/ICU TEAM  PCP:  DEFAULT,PROVIDER, MD  Subjective: 66 year old male who presented to the emergency room with chest pain off and on since Monday of this week. Associated symptoms included diaphoresis. In the emergency room his blood pressure on arrival was 218/139.  Consult for:  DM management  Pt is now s/p cardiac cath.  He is resting comfortably with no complaints.  He denies cp, f/c, n/v, or abdom pain.    Objective:  Intake/Output Summary (Last 24 hours) at 03/10/12 1700 Last data filed at 03/10/12 0800  Gross per 24 hour  Intake    628 ml  Output    750 ml  Net   -122 ml   Blood pressure 135/100, pulse 92, temperature 98.3 F (36.8 C), temperature source Oral, resp. rate 14, height 5\' 8"  (1.727 m), weight 91.7 kg (202 lb 2.6 oz), SpO2 98.00%.  CBG (last 3)   Basename 03/10/12 1512 03/10/12 0912 03/09/12 2217  GLUCAP 138* 256* 222*   Physical Exam: General: No acute respiratory distress Lungs: Clear to auscultation bilaterally without wheezes or crackles Cardiovascular: Regular rate and rhythm without murmur gallop or rub  Abdomen: Nontender, nondistended, soft, bowel sounds positive, no rebound, no ascites, no appreciable mass Extremities: No significant cyanosis, clubbing, or edema bilat lower extremities  Lab Results:  Basename 03/10/12 0418 03/09/12 0555 03/08/12 0510  NA 139 139 141  K 3.4* 3.2* 3.5  CL 106 106 105  CO2 20 21 22   GLUCOSE 168* 269* 341*  BUN 17 19 19   CREATININE 1.28 1.29 1.33  CALCIUM 9.3 9.2 9.2  MG 2.1 -- 2.1  PHOS -- -- --    Basename 03/10/12 0418 03/09/12 0555 03/08/12 0510  WBC 7.3 7.1 7.9  NEUTROABS -- -- --  HGB 14.7 14.6 14.5  HCT 43.1 41.6 40.5  MCV 79.8 78.8 77.3*  PLT 150 143* 160    Basename 03/08/12 0630 03/07/12 2300  CKTOTAL 2175* 3142*  CKMB 81.4* 146.8*  CKMBINDEX -- --  TROPONINI >20.00* >20.00*   Micro Results: Recent Results (from the past  240 hour(s))  URINE CULTURE     Status: Normal   Collection Time   03/07/12 12:27 PM      Component Value Range Status Comment   Specimen Description URINE, CLEAN CATCH   Final    Special Requests NONE   Final    Culture  Setup Time 03/08/2012 01:25   Final    Colony Count NO GROWTH   Final    Culture NO GROWTH   Final    Report Status 03/09/2012 FINAL   Final   MRSA PCR SCREENING     Status: Normal   Collection Time   03/07/12 10:58 PM      Component Value Range Status Comment   MRSA by PCR NEGATIVE  NEGATIVE Final     Studies/Results: All recent x-ray/radiology reports have been reviewed in detail.   Medications: I have reviewed the patient's complete medication list.  Assessment/Plan:  DM 2 - uncontrolled Alc 11.6 - ASA + ACE at time of d/c if renal fxn remains stable/allows continued use - will begin glucotrol + NPH - glucophage to be added at a later date (due to recent contrast exposure) - RN to educate pt on dosing insulin and checking CBGs - diabetic education as outpt  Acute renal failure Continues to improve with volume expansion - follow trend given recent contrast for cath  Unknown  lipid status based on DM and active CAD pt qualifies for treatment regardless  Hypokalemia  Improved - Mg is normal   USAP / NSTEMI Care per primary team [facility])  Malignant HTN / HTN emergency / HTN crisis Care as per primary team [facility])  Coronary artery bypass grafting x4 2009 LIMA to the LAD vein graft to the diagonal branch and saphenous vein graft to the circumflex branch and saphenous vein graft to distal right coronary artery  Dispo As per primary team - we will cont to follow along with you   Cherene Altes, MD [facility] Office  [phone] Pager [phone]  On-Call/Text Page:      [email or web address] [email or web address]      password [facility]

## 2012-03-10 NOTE — Progress Notes (Signed)
ANTICOAGULATION CONSULT NOTE - Follow Up Consult  Pharmacy Consult for heparin Indication: CAD  No Known Allergies  Patient Measurements: Height: 5\' 8"  (172.7 cm) Weight: 202 lb 2.6 oz (91.7 kg) IBW/kg (Calculated) : 68.4    Vital Signs: Temp: 98.3 F (36.8 C) (07/01 0742) Temp src: Oral (07/01 0742) BP: 135/100 mmHg (07/01 0800)  Labs:  Flo Shanks 03/10/12 0418 03/09/12 0555 03/08/12 2100 03/08/12 0630 03/08/12 0510 03/07/12 2300 03/07/12 1202  HGB 14.7 14.6 -- -- -- -- --  HCT 43.1 41.6 -- -- 40.5 -- --  PLT 150 143* -- -- 160 -- --  APTT -- -- -- -- -- -- --  LABPROT 15.2 -- -- -- -- -- --  INR 1.18 -- -- -- -- -- --  HEPARINUNFRC 0.37 0.74* 0.50 -- -- -- --  CREATININE 1.28 1.29 -- -- 1.33 -- --  CKTOTAL -- -- -- 2175* -- 3142* --  CKMB -- -- -- 81.4* -- 146.8* --  TROPONINI -- -- -- >20.00* -- >20.00* 0.97*    Estimated Creatinine Clearance: 64.9 ml/min (by C-G formula based on Cr of 1.28).   Assessment: Patient is a 66 y.o M s/p cath and PCI on heparin for CAD and plan for cath and PCI today 7/1.  No bleeding noted CBC stable.  Heparin level 0.37 is at goal range this morning.     Goal of Therapy:  Heparin level 0.3-0.7 units/ml Monitor platelets by anticoagulation protocol: Yes   Plan:  1) Continue heparin drip  1450 units/hr 2) f/u after cath  Nadine Counts 03/10/2012,9:24 AM

## 2012-03-10 NOTE — Care Management Note (Addendum)
    Page 1 of 1   03/11/2012     11:04:24 AM   CARE MANAGEMENT NOTE 03/11/2012  Patient:  [name], [name]   Account Number:  [id number]  Date Initiated:  03/10/2012  Documentation initiated by:  Elissa Hefty  Subjective/Objective Assessment:   adm w mi     Action/Plan:   lives w wife, no ins listed   Anticipated DC Date:  03/11/2012   Anticipated DC Plan:  [place]  CM consult      [place]   Choice offered to / List presented to:  C-1 Patient   DME arranged  OTHER - SEE COMMENT        HH arranged  HH-1 RN  [place].   Status of service:   Medicare Important Message given?   (If response is "NO", the following Medicare IM given date fields will be blank) Date Medicare IM given:   Date Additional Medicare IM given:    Discharge Disposition:  [place]  Per UR Regulation:  Reviewed for med. necessity/level of care/duration of stay  If discussed at [place] of Stay Meetings, dates discussed:    Comments:  7/2 11am debbie Miyah [place] rn,bsn spoke w pt and gave hhc agency list/copy left in chart also. no pref to hhc agency. ref to marie w [facility] for rn and sw. new to ins this disc. pt also being fitted w lifevest. explained most reasonable way is $10.00 meter and strips at [facility] or [facility] w $15.00 meter and strips.  03/10/12 9:31a debbie Rick Warnick rn,bsn [id number] pt has 30days free effient card. will give pt inform on [facility]. effient pt assist form outside of room for md to sign. will give pt community assist card to help w meds that are nongeneric. pt had not been taking meds for several years. the more meds on 4.00 list perhaps pt will take.

## 2012-03-10 NOTE — Brief Op Note (Signed)
03/07/2012 - 03/10/2012  3:02 PM  PATIENT:  [name]  66 y.o. male with PMH below: presented with Hypertensive Emergency, and NSTEMI -- taken urgently to the cath lab - rays done at a totally occluded SVG to OM that was after long intervention finally re-canalized with final placement of a 2.5 mm BMS stent. He also significant residual lesions in the SVG to RCA as well as in the RPDA. He is now referred for days PCI of these lesions.  PRE-OPERATIVE DIAGNOSIS:  CAD Status post non-STEMI with significant residual disease following PCI to a totally occluded SVG to OM  POST-OPERATIVE DIAGNOSIS:    Successful PTCA with Angiosculpt Atherectomy balloon  - reducing a 90% lesion to 30-40% lesion  Successful extensive stenting of the vein graft to the RCA using 3 overlapping Promus DES stents described below  PROCEDURE:  Procedure(s) (LRB): PERCUTANEOUS CORONARY STENT INTERVENTION (PCI-S) (N/A)  SURGEON:  Surgeon(s) and Role:    * Leonie Man, MD - Primary  EBL:  Less than 20 mL  Devices: 6 [place] sheath--RFA; 6 [place] MP 1 Guide catheter  Filter wire -- predilation balloon: [place] Monorail 2.5 mm x 15 mm  Promus Element DES 3.0 mm x  79mm -- 12 ATM for 30 seconds -- postdilated with a [place] Quantum apex 3.5 mm x 20 mm balloon  At this point, it appeared that the stent and shifted distally uncovering the most proximal portion of the lead of the target lesion - a second stent was to  Promus Element DES 3.0 mm x 8 mm -- 14 ATM for 46 sec; post dilated with a [place] Quantum apex 3.5 mm 20 mm balloon  Attempted filter wire retrieval - lead to further distal tracking of the stent. -- The decision was made to post dilate stents to 4 mm using a [place] Quantum apex 4.0 mm x 20 mm.  The filter wire was successfully retrieved, with the proximal part of lesion still uncovered.  BMW wire advanced into the RPDA  Angiosculpt Atherectomy Balloon 2.0 mm 10 mm -- dilated to 6 ATM for 90 seconds; final  target diameter of 2 mm -- resulted in residual 30-40% lesion  Proximal Vein Graft Stent: Promus Element 4.0 mm x 70mm - 12 ATM for 45 sec;   at the stent overlap -- 16 ATM for 60 sec -- final diameter and the segment was 4.2 mm    wire removed Final angiography revealed excellent stent apposition with no residual uncovered lesion and no distal or proximal dissection. The PDA residual 30-40% lesion.   ANESTHESIA:   local and IV sedation; 2 mg IV Versed, 50 mcg IV fentanyl MEDICATIONS USED:  Angiomax bolus and drip  Sheath removal :  In CCU with manual pressure 2 hours post Angiomax.   DICTATION: .Note written in EPIC  PLAN OF CARE: Observation overnight with plan for d/c tomorrow.  Continue dual antiplatelet therapy for a minimum 1 year  We'll attempt to schedule for Life Vest for discharge given his EF 30-35% with ischemic cardiomyopathy.  Consider discharge on by mouth Lasix  Will increase lisinopril to 10 mg for tomorrow for additional afterload reduction  He will followup with either myself or Dr. Claiborne Billings in one to 2 weeks. If not, he can be scheduled with physician extender.  PATIENT DISPOSITION:  PACU - hemodynamically stable.  The patient stable before, during, and after to proceed -- he tolerated it well.  There was a minor complication while retrieving a distal  protection device, the stent traveled distally uncovering the previously covered lesion requiring 2 additional proximal stents to be placed and postdilated significantly. There was no residual complication following this procedure.  Delay start of Pharmacological VTE agent (>24hrs) due to surgical blood loss or risk of bleeding: not applicable   Alanee Ting W, M.D., M.S. [facility] [address] [place], [place]  [address]  [phone] Pager # [phone]  03/10/2012 3:20 PM

## 2012-03-10 NOTE — Plan of Care (Signed)
Problem: Limited Adherence to Nutrition-Related Recommendations (NB-1.6) Goal: Nutrition education Formal process to instruct or train a patient/client in a skill or to impart knowledge to help patients/clients voluntarily manage or modify food choices and eating behavior to maintain or improve health.  Outcome: Completed/Met Date Met:  03/10/12 Nutrition Education Note  RD consulted for nutrition education regarding diabetes.     Lab Results  Component Value Date    HGBA1C 11.6* 03/07/2012    RD provided "Carbohydrate Counting for Diabetes" handout from the Academy of Nutrition and Dietetics. Discussed different food groups and their effects on blood sugar, emphasizing carbohydrate-containing foods. Provided list of carbohydrates and recommended serving sizes of common foods.  Discussed importance of controlled and consistent carbohydrate intake throughout the day. Provided examples of ways to balance meals/snacks and encouraged intake of high-fiber, whole grain complex carbohydrates.  Expect fair compliance. Pt seems unwilling to make changes but family members in room are encouraging pt.   Body mass index is 30.74 kg/(m^2). Pt meets criteria for obesity class 1 based on current BMI.  Current diet order is NPO. Labs and medications reviewed. No further nutrition interventions warranted at this time. RD contact information provided. If additional nutrition issues arise, please re-consult RD.  Orson Slick RD, LDN Pager [phone] After Hours pager [phone]

## 2012-03-10 NOTE — Progress Notes (Signed)
Right femoral sheath removed at 16:55 with manual pressure for 20 minutes. Right groin level 0. Right femoral and right pedal pulse 2+ before and after sheath removal. Pt educated of bleeding risks and states he understands straight leg precautions. Clear tegaderm dressing and 4x4 placed on right groin cath site.  Call light in reach.    Loni Muse, RN

## 2012-03-10 NOTE — Progress Notes (Signed)
[facility] Patient Description   66 y.o. male with PMH below: presented with Hypertensive Emergency, and NSTEMI -- taken urgently to the cath lab -- see report.  Also with mild acute renal failure & hyperglycemia that appears to have been volume contraction.     Subjective: No complaints.  Slept very well.  He was up in the bathroom washing up.  Objective: Vital signs in last 24 hours: Temp:  [98.6 F (37 C)-98.7 F (37.1 C)] 98.6 F (37 C) (06/30 1500) Resp:  [10-26] 17  (07/01 0700) BP: (105-142)/(68-101) 142/101 mmHg (07/01 0742) SpO2:  [98 %-99 %] 99 % (06/30 1500)    Intake/Output from previous day: 06/30 0701 - 07/01 0700 In: 767.3 [I.V.:767.3] Out: 750 [Urine:750] Intake/Output this shift:    Medications Current Facility-Administered Medications  Medication Dose Route Frequency Provider Last Rate Last Dose  . 0.9 %  sodium chloride infusion  250 mL Intravenous Continuous Pixie Casino, MD 10 mL/hr at 03/08/12 2000 250 mL at 03/08/12 2000  . 0.9 %  sodium chloride infusion  250 mL Intravenous PRN Tarri Fuller, PA      . 0.9 %  sodium chloride infusion  1 mL/kg/hr Intravenous Continuous Tarri Fuller, PA      . acetaminophen (TYLENOL) tablet 650 mg  650 mg Oral Q4H PRN Leonie Man, MD      . aspirin chewable tablet 324 mg  324 mg Oral Pre-Cath Tarri Fuller, [place]      . aspirin chewable tablet 81 mg  81 mg Oral Daily Leonie Man, MD   81 mg at 03/09/12 0905  . atorvastatin (LIPITOR) tablet 80 mg  80 mg Oral q1800 Leonie Man, MD   80 mg at 03/09/12 1747  . carvedilol (COREG) tablet 12.5 mg  12.5 mg Oral BID WC Leonie Man, MD   12.5 mg at 03/09/12 1747  . heparin ADULT infusion 100 units/mL (25000 units/250 mL)  1,450 Units/hr Intravenous Continuous Anh P Pham, PHARMD 14.5 mL/hr at 03/09/12 2000 1,450 Units/hr at 03/09/12 2000  . insulin aspart (novoLOG) injection 0-20 Units  0-20 Units Subcutaneous TID WC Cherene Altes, MD    4 Units at 03/09/12 1748  . insulin aspart (novoLOG) injection 0-5 Units  0-5 Units Subcutaneous QHS Cherene Altes, MD   2 Units at 03/09/12 2222  . insulin aspart (novoLOG) injection 4 Units  4 Units Subcutaneous TID WC Cherene Altes, MD   4 Units at 03/09/12 1747  . insulin glargine (LANTUS) injection 20 Units  20 Units Subcutaneous QHS Cherene Altes, MD   10 Units at 03/09/12 2221  . isosorbide mononitrate (IMDUR) 24 hr tablet 30 mg  30 mg Oral Daily Leonie Man, MD   30 mg at 03/09/12 0904  . lisinopril (PRINIVIL,ZESTRIL) tablet 5 mg  5 mg Oral Daily Leonie Man, MD   5 mg at 03/09/12 [id number]  . morphine 2 MG/ML injection 2 mg  2 mg Intravenous Q1H PRN Leonie Man, MD      . nitroGLYCERIN 0.2 mg/mL in dextrose 5 % infusion  2-200 mcg/min Intravenous Continuous Leonie Man, MD 3 mL/hr at 03/08/12 1300 10 mcg/min at 03/08/12 1300  . ondansetron (ZOFRAN) injection 4 mg  4 mg Intravenous Q6H PRN Leonie Man, MD      . pantoprazole (PROTONIX) EC tablet 40 mg  40 mg Oral Q0600 Leonie Man, MD   40 mg at 03/09/12  [id number]  . potassium chloride SA (K-DUR,KLOR-CON) CR tablet 40 mEq  40 mEq Oral Once Cherene Altes, MD   40 mEq at 03/09/12 1049  . potassium chloride SA (K-DUR,KLOR-CON) CR tablet 40 mEq  40 mEq Oral Once Erlene Quan, [place]      . prasugrel (EFFIENT) tablet 10 mg  10 mg Oral Daily Leonie Man, MD   10 mg at 03/09/12 0904  . sodium chloride 0.9 % injection 3 mL  3 mL Intravenous Q12H Tarri Fuller, PA      . sodium chloride 0.9 % injection 3 mL  3 mL Intravenous PRN Tarri Fuller, PA      . DISCONTD: insulin glargine (LANTUS) injection 10 Units  10 Units Subcutaneous QHS Cherene Altes, MD   10 Units at 03/08/12 2140    PE: General appearance: alert, cooperative and no distress Lungs: clear to auscultation bilaterally Heart: Irregular rate.  Rate elevated.  No MM Extremities: No LEE Pulses: Radials 2+ and symmetric Neuro: Grossly Normal  Lab  Results:   Basename 03/10/12 0418 03/09/12 0555 03/08/12 0510  WBC 7.3 7.1 7.9  HGB 14.7 14.6 14.5  HCT 43.1 41.6 40.5  PLT 150 143* 160   BMET  Basename 03/10/12 0418 03/09/12 0555 03/08/12 0510  NA 139 139 141  K 3.4* 3.2* 3.5  CL 106 106 105  CO2 20 21 22   GLUCOSE 168* 269* 341*  BUN 17 19 19   CREATININE 1.28 1.29 1.33  CALCIUM 9.3 9.2 9.2   PT/INR  Basename 03/10/12 0418  LABPROT 15.2  INR 1.18   Cholesterol No results found for this basename: CHOL in the last 72 hours  Cardiac Panel (last 3 results)  Basename 03/08/12 0630 03/07/12 2300 03/07/12 1202  CKTOTAL 2175* 3142* --  CKMB 81.4* 146.8* --  TROPONINI >20.00* >20.00* 0.97*  RELINDX 3.7* 4.7* --    Studies/Results: @RISRSLT2 @   Assessment/Plan  Principal Problem:  *NSTEMI - Occluded SVG-OM, s/p BMS 03/08/12 Active Problems:  Hypertensive crisis  DM (diabetes mellitus),poorly controlled  CAD (coronary artery disease), with CABG in 2009 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 K+ 75meq this am   Erlene Quan 03/10/2012 8:23 AM  ATTENDING ATTESTATION:  I have seen and examined the patient along with Kyle Ransom, PA. I have reviewed the chart, notes and new data. I agree with [name]'s findings, examination and recommendations.   [name] continues to looks great.  He has not had any further episodes of angina after restoration of flow in the occluded SVG-OM. Renal function is stable. Echo ordered & done late yesterday - read pending. Severe stenoses in the SVG-RCA and RPDA as well as LAD. -- PCI today to SVG & RPDA. Med Rx for LAD  Continue DAPT    Imdur started, on BB & ACE-I. BB increased yesterday, DBP still in ~100 mmHg range, will increase ACE-I for better afterload reduction.  Appreciate TRH assistance with DM care. Lantus + SSI. Will need to establish discharge regimen.   Statin initiated on admission -- will f/u labs as OP   CM,SW &  Cardiac Rehab consultants are assisting.   PPI for GI prophylaxis  The Cath/PCI procedure has been fully reviewed with the patient and written informed consent has been obtained.  Leonie Man, M.D., M.S. [facility] [address]. [place], [place]  [address]  [phone] Pager # [phone]  03/10/2012 8:32 AM

## 2012-03-11 DIAGNOSIS — I2510 Atherosclerotic heart disease of native coronary artery without angina pectoris: Secondary | ICD-10-CM

## 2012-03-11 DIAGNOSIS — E785 Hyperlipidemia, unspecified: Secondary | ICD-10-CM

## 2012-03-11 LAB — GLUCOSE, CAPILLARY: Glucose-Capillary: 116 mg/dL — ABNORMAL HIGH (ref 70–99)

## 2012-03-11 LAB — BASIC METABOLIC PANEL
BUN: 15 mg/dL (ref 6–23)
Chloride: 106 mEq/L (ref 96–112)
GFR calc Af Amer: 64 mL/min — ABNORMAL LOW (ref >90)
Potassium: 3.9 mEq/L (ref 3.5–5.1)
Sodium: 142 mEq/L (ref 135–145)

## 2012-03-11 MED ORDER — SIMVASTATIN 80 MG PO TABS: 80.0000 mg | ORAL_TABLET | Freq: Every evening | ORAL | Status: DC

## 2012-03-11 MED ORDER — CARVEDILOL 12.5 MG PO TABS: 12.5000 mg | ORAL_TABLET | Freq: Two times a day (BID) | ORAL | Status: DC

## 2012-03-11 MED ORDER — ISOSORBIDE MONONITRATE ER 30 MG PO TB24: 30.0000 mg | ORAL_TABLET | Freq: Every day | ORAL | Status: DC

## 2012-03-11 MED ORDER — PRASUGREL HCL 10 MG PO TABS: 10.0000 mg | ORAL_TABLET | Freq: Every day | ORAL | Status: DC

## 2012-03-11 MED ORDER — NITROGLYCERIN 0.4 MG SL SUBL: 0.4000 mg | SUBLINGUAL_TABLET | SUBLINGUAL | Status: DC | PRN

## 2012-03-11 MED ORDER — GLIPIZIDE 10 MG PO TABS: 10.0000 mg | ORAL_TABLET | Freq: Every day | ORAL | Status: DC

## 2012-03-11 MED ORDER — LIVING WELL WITH DIABETES BOOK
Freq: Once | Status: AC
  Administered 2012-03-11: 14:00:00
  Filled 2012-03-11: qty 1

## 2012-03-11 MED ORDER — ACETAMINOPHEN 325 MG PO TABS: 650.0000 mg | ORAL_TABLET | ORAL | Status: DC | PRN

## 2012-03-11 MED ORDER — INSULIN NPH (HUMAN) (ISOPHANE) 100 UNIT/ML ~~LOC~~ SUSP: 20.0000 [IU] | Freq: Two times a day (BID) | SUBCUTANEOUS | Status: DC

## 2012-03-11 MED ORDER — LISINOPRIL 10 MG PO TABS: 10.0000 mg | ORAL_TABLET | Freq: Every day | ORAL | Status: DC

## 2012-03-11 MED ORDER — BD GETTING STARTED TAKE HOME KIT: 1/2ML X 30G SYRINGES: 1.0000 | Freq: Once | Status: DC

## 2012-03-11 MED ORDER — ASPIRIN 81 MG PO CHEW: 81.0000 mg | CHEWABLE_TABLET | Freq: Every day | ORAL | Status: DC

## 2012-03-11 MED ORDER — FUROSEMIDE 20 MG PO TABS: 20.0000 mg | ORAL_TABLET | Freq: Every day | ORAL | Status: DC

## 2012-03-11 MED ORDER — BD GETTING STARTED TAKE HOME KIT: 1/2ML X 30G SYRINGES
1.0000 | Freq: Once | Status: AC
  Administered 2012-03-11: 1
  Filled 2012-03-11: qty 1

## 2012-03-11 MED FILL — Dextrose Inj 5%: INTRAVENOUS | Qty: 50 | Status: AC

## 2012-03-11 NOTE — Discharge Instructions (Signed)
[facility] [phone] rn, soc workerAngioplasty Angioplasty is a procedure to widen a narrow blood vessel. The procedure is usually done on the blood vessels of the heart (coronary arteries) but may help vessels to other parts of the body such as the legs. When a vessel in the heart becomes partially blocked there is decreased blood flow to that area. This may lead to chest pain or a heart attack (myocardial infarction).  Angioplasty may be done after a procedure that found a problem or as an emergency to treat a heart attack by opening the blocked arteries. The arteries are usually blocked by cholesterol buildup (plaque) in the lining or walls. LET YOUR CAREGIVER KNOW ABOUT:  Allergies.   Medicines taken, including herbs, eyedrops, over-the-counter medicines, and creams.   Use of steroids (by mouth or creams).   Previous problems with anesthetics or numbing medicines.   Possibility of pregnancy, if this applies.   History of blood clots (thrombophlebitis).   History of bleeding or blood problems.   Previous surgery.   Other health problems.  RISKS AND COMPLICATIONS  Damage to the artery.   A blockage may return.   Bleeding at the insertion site.   Blood clot to another part of the body.  BEFORE THE PROCEDURE  Let your caregiver know if you have had an allergy to dyes used in X-ray, or if you have ever had kidney problems or failure.   Do not eat or drink starting from midnight up to the time of the procedure, or as directed.   You may drink enough water to take your medicines the morning of the procedure if you were instructed to do so.   You should be at the hospital or outpatient facility where the procedure is to be done 60 minutes prior to the procedure or as directed.  PROCEDURE  You may be given a medicine to help you relax before and during the procedure through an intravenous (IV) access in your hand or arm.   Medicine that numbs the area (local anesthetic)  may be used before inserting the long, thin tube (catheter).   You will be prepared for the procedure by washing and shaving the area where the catheter will be inserted. This is usually done in the groin.   A catheter will be inserted into an artery using a guide wire. This is guided under a type of X-ray (fluoroscopy) to the opening of the blocked artery.   Dye is then injected and X-rays are taken.   Once positioned at the narrowed portion of the blood vessel, the balloon is inflated to make the artery wider. Expanding the balloon crushes the plaque into the wall of the vessel and improves the blood flow.   Sometimes the artery may be made wider using a laser or other tools to remove plaque.   When the blood flow is better, the balloon is deflated and the catheter is removed.  AFTER THE PROCEDURE  You will stay in bed for several hours.   The access site will be watched and you will be checked frequently.   Blood tests, X-rays, and an electrocardiogram (EKG) may be done.   You may stay in the hospital overnight for observation.  Document Released: 08/24/2000 Document Revised: 08/16/2011 Document Reviewed: 12/18/2007 ExitCare Patient Information 2012 ExitCare, LLCMyocardial Infarction A myocardial infarction (MI) is damage to the heart that is not reversible. It is also called a heart attack. An MI usually occurs when a heart (coronary) artery becomes blocked  or narrowed. This cuts off the blood supply to the heart. When one or more of the heart (coronary) arteries becomes blocked, that area of the heart begins to die. This causes pain felt during an MI.  If you think you might be having an MI, call your local emergency services immediately (911 in [place] It is recommended that you take a 162 mg non-enteric coated aspirin if you do not have an aspirin allergy. Do not drive yourself to the hospital or wait to see if your symptoms go away. The sooner MI is treated, the greater the amount  of heart muscle saved. Time is muscle. It can save your life. CAUSES  An MI can occur from:  A gradual buildup of a fatty substance called plaque. When plaque builds up in the arteries, this condition is called atherosclerosis. This buildup can block or reduce the blood supply to the heart artery(s).   A sudden plaque rupture within a heart artery that causes a blood clot (thrombus). A blood clot can block the heart artery which does not allow blood flow to the heart.   A severe tightening (spasm) of the heart artery. This is a less common cause of a heart attack. When a heart artery spasms, it cuts off blood flow through the artery. Spasms can occur in heart arteries that do not have atherosclerosis.  RISK FACTORS People at risk for an MI usually have one or more risk factors, such as:  High blood pressure.   High cholesterol.   Smoking.   Gender. Men have a higher heart attack risk.   Overweight/obesity.   Age.   Family history.   Lack of exercise.   Diabetes.   Stress.   Excessive alcohol use.   Street drug use (cocaine and methamphetamines).  SYMPTOMS  MI symptoms can vary, such as:  In both men and women, MI symptoms can include the following:   Chest pain. The chest pain may feel like a crushing, squeezing, or "pressure" type feeling. MI pain can be "referred," meaning pain can be caused in one part of the body but felt in another part of the body. Referred MI pain may occur in the left arm, neck, or jaw. Pain may even be felt in the right arm.   Shortness of breath (dyspnea).   Heartburn or indigestion with or without vomiting, shortness of breath, or sweating (diaphoresis).   Sudden, cold sweats.   Sudden lightheadedness.   Upper back pain.   Women can have unique MI symptoms, such as:   Unexplained feelings of nervousness or anxiety.   Discomfort between the shoulder blades (scapula) or upper back.   Tingling in the hands and arms.   In elderly  people (regardless of gender), MI symptoms can be subtle, such as:   Sweating (diaphoresis).   Shortness of breath (dyspnea).   General tiredness (fatigue) or not feeling well (malaise).  DIAGNOSIS  Diagnosis of an MI involves several tests such as:  An assessment of your vital signs such as heart rhythm, blood pressure, respiratory rate, and oxygen level.   An EKG (ECG) to look at the electrical activity of your heart.   Blood tests called cardiac markers are drawn at scheduled times to measure proteins or enzymes released by the damaged heart muscle.   A chest X-ray.   An echocardiogram to evaluate heart motion and blood flow.   Coronary angiography (cardiac catheterization). This is a diagnostic procedure to look at the heart arteries.  TREATMENT  Acute Intervention. For an MI, the national standard in the [place] is to have an acute intervention in under 90 minutes from the time you get to the hospital. An acute intervention is a special procedure to open up the heart arteries. It is done in a treatment room called a "catheterization lab" (cath lab). Some hospitals do no have a cath lab. If you are having an MI and the hospital does not have a cath lab, the standard is to transport you to a hospital that has one. In the cath lab, acute intervention includes:  Angioplasty. An angioplasty involves inserting a thin, flexible tube (catheter) into an artery in either your groin or wrist. The catheter is threaded to the heart arteries. A balloon at the end of the catheter is inflated to open a narrowed or blocked heart artery. During an angioplasty procedure, a small mesh tube (stent) may be used to keep the heart artery open. Depending on your condition and health history, one of two types of stents may be placed:   Drug-eluting stent (DES). A DES is coated with a medicine to prevent scar tissue from growing over the stent. With drug-eluting stents, blood thinning medicine will need to  be taken for up to a year.   Bare metal stent. This type of stent has no special coating to keep tissue from growing over it. This type of stent is used if you cannot take blood thinning medicine for a prolonged time or you need surgery in the near future. After a bare metal stent is placed, blood thinning medicine will need to be taken for about a month.   If you are taking blood thinning medicine (anti-platelet therapy) after stent placement, do not stop taking it unless your caregiver says it is okay to do so. Make sure you understand how long you need to take the medicine.  Surgical Intervention  If an acute intervention is not successful, surgery may be needed:   Open heart surgery (coronary artery bypass graft, CABG). CABG takes a vein (saphenous vein) from your leg. The vein is then attached to the blocked heart artery which bypasses the blockage. This then allows blood flow to the heart muscle.  Additional Interventions  A "clot buster" medicine (thrombolytic) may be given. This medicine can help break up a clot in the heart artery. This medicine may be given if a person cannot get to a cath lab right away.   Intra-aortic balloon pump (IABP). If you have suffered a very severe MI and are too unstable to go to the cath lab or to surgery, an IABP may be used. This is a temporary mechanical device used to increase blood flow to the heart and reduce the workload of the heart until you are stable enough to go to the cath lab or surgery.  HOME CARE INSTRUCTIONS After an MI, you may need the following:  Medication. Take medication as directed by your caregiver. Medications after an MI may:   Keep your blood from clotting easily (blood thinners).   Control your blood pressure.   Help lower your cholesterol.   Control abnormal heart rhythms.   Lifestyle changes. Under the guidance of your caregiver, lifestyle changes include:   Quitting smoking, if you smoke. Your caregiver can help you  quit.   Being physically active.   Maintaining a healthy weight.   Eating a heart healthy diet. A dietician can help you learn healthy eating options.   Managing diabetes.   Reducing stress.  Limiting alcohol intake.  SEEK IMMEDIATE MEDICAL CARE IF:   You have severe chest pain, especially if the pain is crushing or pressure-like and spreads to the arms, back, neck, or jaw. This is an emergency. Do not wait to see if the pain will go away. Get medical help at once. Call your local emergency services (911 in the U.S[place] Do not drive yourself to the hospital.   You have shortness of breath during rest, sleep, or with activity.   You have sudden sweating or clammy skin.   You feel sick to your stomach (nauseous) and throw up (vomit).   You suddenly become lightheaded or dizzy.   You feel your heart beating rapidly or you notice "skipped" beats.  MAKE SURE YOU:   Understand these instructions.   Will watch your condition.   Will get help right away if you are not doing well or get worse.  Document Released: 08/27/2005 Document Revised: 08/16/2011 Document Reviewed: 01/24/2011 [facility] Patient Information 2012 [place], [place].Marland Kitchen

## 2012-03-11 NOTE — Progress Notes (Signed)
[place]   Agencies that are Medicare-Certified and are affiliated with [facility] [place]  Telephone Number Address  [place] has ownership interest in this company; however, you are under no obligation to use this agency. [phone] or  [place] [place], [place] [address]   Agencies that are Medicare-Certified and are not affiliated with [facility] [place] Telephone Number Address  [facility] [phone] Fax [phone] [address], [place], [place]  [address]  [facility] [phone] or [phone] Fax [phone] [id number], [place] [address]  [facility] [phone] Fax [phone] [place], [place] [address]  [place] [phone] Fax [phone] [address] [place], [place]  [address]  [facility] [phone] Fax [phone] [address] [place], [place] [address]  [facility] [place], [place] [address]  [facility] [phone]  [address], [place] [address]  [facility] [phone] or [phone] Fax [phone] [place] [address] [place], [place]  [address]  [facility] [phone] Fax [phone] [place], [place]  [address]  [facility]  [phone] Fax [phone] [address] [place], [place] [address]

## 2012-03-11 NOTE — Progress Notes (Signed)
CARDIAC REHAB PHASE I   PRE:  Rate/Rhythm: 94 SR    BP: sitting 117/76    SaO2:   MODE:  Ambulation: 700 ft   POST:  Rate/Rhythm: 105 ST with PVCs    BP: sitting 123/95     SaO2:   Tolerated well. BP up after walk. Some slight SOB walking. Pt anxious to d/c. Ed completed with wife present. Pt not very receptive. Not sure if pt overwhelmed or depressed. Reluctantly gave permission for CRPII. Gave financial aid application, which pt seems reluctant to fill out. Pt did not offer much feed back at all. Wife only slightly more feedback. Will send referral to [place] cRPII. [id number]  Darrick Meigs CES, ACSM

## 2012-03-11 NOTE — Discharge Summary (Signed)
Patient ID: [name],  MRN: [id number], DOB/AGE: 1946-08-12 66 y.o.  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 He has a history of CABG X 4 in 2009.  He has not taken any of cardiac medications since 2010 secondary to cost.  Associated symptoms included diaphoresis. He denies nausea vomiting or shortness of breath. He has not slept very well for the last several nights secondary to waking up with chest pain. Here in the emergency room his blood pressure on arrival was 218/139. He was given aspirin morphine and labetalol and nitroglycerin drip had been started.  His Troponin was positive and the pt was taken to the lab. Cath revealed patent LIMA-LAD, distal occlusion of the SVG-OM, patent SVG-PDA with 95% distal PDA stenosis, and patent SVG-Dx. He underwent BMS placement to SVG-OM as well as thrombectomy. EF by echo was 30-35%. He was fitted for a LifeVest. We feel he can be discharged 03/11/12 with plans for early follow up next week.   Discharge Vitals:  Blood pressure 123/95, pulse 93, temperature 98.6 F (37 C), temperature source Oral, resp. rate 19, height 5\' 8"  (1.727 m), weight 91.7 kg (202 lb 2.6 oz), SpO2 97.00%.    Labs: Results for orders placed during the hospital encounter of 03/07/12 (from the past 48 hour(s))  GLUCOSE, CAPILLARY     Status: Abnormal   Collection Time   03/09/12  5:08 PM      Component Value Range Comment   Glucose-Capillary 158 (*) 70 - 99  mg/dL   GLUCOSE, CAPILLARY     Status: Abnormal   Collection Time   03/09/12 10:17 PM      Component Value Range Comment   Glucose-Capillary 222 (*) 70 - 99 mg/dL   HEPARIN LEVEL (UNFRACTIONATED)     Status: Normal   Collection Time   03/10/12  4:18 AM      Component Value Range Comment   Heparin Unfractionated 0.37  0.30 - 0.70 IU/mL   CBC     Status: Normal   Collection Time   03/10/12  4:18 AM      Component Value Range Comment   WBC 7.3  4.0 - 10.5 K/uL    RBC 5.40  4.22 - 5.81 MIL/uL    Hemoglobin 14.7  13.0 - 17.0 g/dL    HCT 43.1  39.0 - 52.0 %    MCV 79.8  78.0 - 100.0 fL    MCH 27.2  26.0 - 34.0 pg    MCHC 34.1  30.0 - 36.0 g/dL    RDW 14.0  11.5 - 15.5 %    Platelets 150  150 - 400 K/uL   BASIC METABOLIC PANEL     Status: Abnormal   Collection Time   03/10/12  4:18 AM      Component Value Range Comment   Sodium 139  135 - 145 mEq/L    Potassium 3.4 (*) 3.5 - 5.1 mEq/L    Chloride 106  96 - 112 mEq/L  CO2 20  19 - 32 mEq/L    Glucose, Bld 168 (*) 70 - 99 mg/dL    BUN 17  6 - 23 mg/dL    Creatinine, Ser 1.28  0.50 - 1.35 mg/dL    Calcium 9.3  8.4 - 10.5 mg/dL    GFR calc non Af Amer 58 (*) >90 mL/min    GFR calc Af Amer 67 (*) >90 mL/min   MAGNESIUM     Status: Normal   Collection Time   03/10/12  4:18 AM      Component Value Range Comment   Magnesium 2.1  1.5 - 2.5 mg/dL   PROTIME-INR     Status: Normal   Collection Time   03/10/12  4:18 AM      Component Value Range Comment   Prothrombin Time 15.2  11.6 - 15.2 seconds    INR 1.18  0.00 - 1.49   GLUCOSE, CAPILLARY     Status: Abnormal   Collection Time   03/10/12  9:12 AM      Component Value Range Comment   Glucose-Capillary 256 (*) 70 - 99 mg/dL   POCT ACTIVATED CLOTTING TIME     Status: Normal   Collection Time   03/10/12  1:01 PM      Component Value Range Comment   Activated Clotting Time 484     GLUCOSE, CAPILLARY     Status: Abnormal   Collection Time   03/10/12  3:12 PM      Component Value Range  Comment   Glucose-Capillary 138 (*) 70 - 99 mg/dL   GLUCOSE, CAPILLARY     Status: Abnormal   Collection Time   03/10/12  5:24 PM      Component Value Range Comment   Glucose-Capillary 134 (*) 70 - 99 mg/dL   GLUCOSE, CAPILLARY     Status: Abnormal   Collection Time   03/10/12  9:24 PM      Component Value Range Comment   Glucose-Capillary 173 (*) 70 - 99 mg/dL   BASIC METABOLIC PANEL     Status: Abnormal   Collection Time   03/11/12  6:10 AM      Component Value Range Comment   Sodium 142  135 - 145 mEq/L    Potassium 3.9  3.5 - 5.1 mEq/L    Chloride 106  96 - 112 mEq/L    CO2 22  19 - 32 mEq/L    Glucose, Bld 190 (*) 70 - 99 mg/dL    BUN 15  6 - 23 mg/dL    Creatinine, Ser 1.34  0.50 - 1.35 mg/dL    Calcium 9.6  8.4 - 10.5 mg/dL    GFR calc non Af Amer 55 (*) >90 mL/min    GFR calc Af Amer 64 (*) >90 mL/min   GLUCOSE, CAPILLARY     Status: Abnormal   Collection Time   03/11/12 12:36 PM      Component Value Range Comment   Glucose-Capillary 314 (*) 70 - 99 mg/dL     Disposition:  Follow-up Information    Follow up with HARDING,DAVID W, MD. (office will call)    Contact information:   [facility] [address] [place] [phone]          Discharge Medications:  Medication List  As of 03/11/2012  3:51 PM   TAKE these medications         acetaminophen 325 MG tablet  Commonly known as: TYLENOL   Take 2 tablets (650 mg total) by mouth every 4 (four) hours as needed.      aspirin 81 MG chewable tablet   Chew 1 tablet (81 mg total) by mouth daily.      bd getting started take home kit Misc   1 kit by Other route once.      carvedilol 12.5 MG tablet   Commonly known as: COREG   Take 1 tablet (12.5 mg total) by mouth 2 (two) times daily with a meal.      furosemide 20 MG tablet   Commonly known as: LASIX   Take 1 tablet (20 mg total) by mouth daily.      glipiZIDE 10 MG tablet   Commonly known as:  GLUCOTROL   Take 1 tablet (10 mg total) by mouth daily before breakfast.      insulin NPH 100 UNIT/ML injection   Commonly known as: HUMULIN N,NOVOLIN N   Inject 20 Units into the skin 2 (two) times daily before a meal.      isosorbide mononitrate 30 MG 24 hr tablet   Commonly known as: IMDUR   Take 1 tablet (30 mg total) by mouth daily.      lisinopril 10 MG tablet   Commonly known as: PRINIVIL,ZESTRIL   Take 1 tablet (10 mg total) by mouth daily.      nitroGLYCERIN 0.4 MG SL tablet   Commonly known as: NITROSTAT   Place 1 tablet (0.4 mg total) under the tongue every 5 (five) minutes as needed for chest pain.      prasugrel 10 MG Tabs   Commonly known as: EFFIENT   Take 1 tablet (10 mg total) by mouth daily.      simvastatin 80 MG tablet   Commonly known as: ZOCOR   Take 1 tablet (80 mg total) by mouth every evening.              Duration of Discharge Encounter: Greater than 30 minutes including physician time.  Signed, Kerin Ransom PA-C 03/11/2012 3:51 PM  I have seen& examined the pt today, & reviewed the chart.   I agree with Kilroy's summary as noted above.    He is on stable doses of Coreg & Lisinopril as well as Imdur. With his EF of 30-35% and elevated filling pressures, would d/c with low dose lasix ~20mg  daily to avoid CHF once he is back to home eating.  [facility] consultation.  He is getting fitted for LifeVest now.  BP & exam look much better. Groin stable. No active SSx of CHF, no further CP.  Tolerated 2nd complicated PCI procedure yesterday -- now has best chance at some inferior-inferolateral LV recovery.  He will need close f/u in ~1-2 weeks for cardiomyopathy f/u -> can see physician extender, then ROV with me in ~6-8 weeks.  Thanks to [facility] service for assistance with starting DM Rx. We will follow their recommendations for discharge.  He is ready for discharge today after finished LifeVest fitting.    Leonie Man, M.D., M.S.  [facility]  [facility]  [address] [place], [place] [address]  [phone]  Pager # [phone]

## 2012-03-11 NOTE — Consult Note (Signed)
[facility]  PCP: none    Subjective: Pt has questions about diabetes management. No other complaints.   Objective: Blood pressure 132/92, pulse 93, temperature 98.8 F (37.1 C), temperature source Oral, resp. rate 18, height 5\' 8"  (1.727 m), weight 91.7 kg (202 lb 2.6 oz), SpO2 96.00%. Weight change:   Intake/Output Summary (Last 24 hours) at 03/11/12 1106 Last data filed at 03/11/12 0000  Gross per 24 hour  Intake 543.19 ml  Output    700 ml  Net -156.81 ml    Physical Exam: General: No acute respiratory distress  Lungs: Clear to auscultation bilaterally without wheezes or crackles  Cardiovascular: Regular rate and rhythm without murmur gallop or rub  Abdomen: Nontender, nondistended, soft, bowel sounds positive, no rebound, no ascites, no appreciable mass  Extremities: No significant cyanosis, clubbing, or edema bilat lower extremities   Lab Results:  Basename 03/11/12 0610 03/10/12 0418  NA 142 139  K 3.9 3.4*  CL 106 106  CO2 22 20  GLUCOSE 190* 168*  BUN 15 17  CREATININE 1.34 1.28  CALCIUM 9.6 9.3  MG -- 2.1  PHOS -- --   No results found for this basename: AST:2,ALT:2,ALKPHOS:2,BILITOT:2,PROT:2,ALBUMIN:2 in the last 72 hours No results found for this basename: LIPASE:2,AMYLASE:2 in the last 72 hours  Basename 03/10/12 0418 03/09/12 0555  WBC 7.3 7.1  NEUTROABS -- --  HGB 14.7 14.6  HCT 43.1 41.6  MCV 79.8 78.8  PLT 150 143*   No results found for this basename: CKTOTAL:3,CKMB:3,CKMBINDEX:3,TROPONINI:3 in the last 72 hours No components found with this basename: POCBNP:3 No results found for this basename: DDIMER:2 in the last 72 hours No results found for this basename: HGBA1C:2 in the last 72 hours No results found for this basename: CHOL:2,HDL:2,LDLCALC:2,TRIG:2,CHOLHDL:2,LDLDIRECT:2 in the last 72 hours No results found for this basename: TSH,T4TOTAL,FREET3,T3FREE,THYROIDAB in the last 72 hours No results found for this basename:  VITAMINB12:2,FOLATE:2,FERRITIN:2,TIBC:2,IRON:2,RETICCTPCT:2 in the last 72 hours  Micro Results: Recent Results (from the past 240 hour(s))  URINE CULTURE     Status: Normal   Collection Time   03/07/12 12:27 PM      Component Value Range Status Comment   Specimen Description URINE, CLEAN CATCH   Final    Special Requests NONE   Final    Culture  Setup Time 03/08/2012 01:25   Final    Colony Count NO GROWTH   Final    Culture NO GROWTH   Final    Report Status 03/09/2012 FINAL   Final   MRSA PCR SCREENING     Status: Normal   Collection Time   03/07/12 10:58 PM      Component Value Range Status Comment   MRSA by PCR NEGATIVE  NEGATIVE Final     Studies/Results: Dg Chest Port 1 View  03/07/2012  *RADIOLOGY REPORT*  Clinical Data: Hypertensive crisis  PORTABLE CHEST - 1 VIEW  Comparison: None.  Findings: Upper normal heart size.  Clear lungs.  No pneumothorax and no pleural effusion.  Normal vascularity.  IMPRESSION: No active cardiopulmonary disease.  Original Report Authenticated By: Jamas Lav, M.D.    Medications: Scheduled Meds:   . aspirin  81 mg Oral Daily  . atorvastatin  80 mg Oral q1800  . bivalirudin      . carvedilol  12.5 mg Oral BID WC  . fentaNYL      . glipiZIDE  10 mg Oral QAC breakfast  . heparin      . insulin aspart  0-15 Units Subcutaneous TID WC  . insulin aspart  0-5 Units Subcutaneous QHS  . insulin NPH  20 Units Subcutaneous BID AC  . isosorbide mononitrate  30 mg Oral Daily  . lidocaine      . lisinopril  10 mg Oral Daily  . midazolam      . nitroGLYCERIN      . prasugrel  10 mg Oral Daily  . sodium chloride  3 mL Intravenous Q12H  . DISCONTD: insulin aspart  0-15 Units Subcutaneous TID WC  . DISCONTD: insulin aspart  0-20 Units Subcutaneous TID WC  . DISCONTD: insulin aspart  0-5 Units Subcutaneous QHS  . DISCONTD: insulin aspart  4 Units Subcutaneous TID WC  . DISCONTD: insulin glargine  20 Units Subcutaneous QHS  . DISCONTD: lisinopril   5 mg Oral Daily  . DISCONTD: pantoprazole  40 mg Oral Q0600  . DISCONTD: sodium chloride  3 mL Intravenous Q12H   Continuous Infusions:   . sodium chloride 1 mL/kg/hr (03/10/12 1718)  . sodium chloride    . DISCONTD: sodium chloride 250 mL (03/08/12 2000)  . DISCONTD: sodium chloride    . DISCONTD: bivalirudin (ANGIOMAX) infusion 5 mg/mL (Cath Lab,ACS,PCI indication)    . DISCONTD: heparin 1,450 Units/hr (03/09/12 2000)  . DISCONTD: nitroGLYCERIN 10 mcg/min (03/08/12 1300)   PRN Meds:.acetaminophen, morphine injection, ondansetron (ZOFRAN) IV, sodium chloride, DISCONTD: sodium chloride, DISCONTD: acetaminophen, DISCONTD:  morphine injection, DISCONTD: ondansetron (ZOFRAN) IV, DISCONTD: sodium chloride  Assessment/Plan: DM 2 - uncontrolled  Alc 11.6 - ASA + ACE at time of d/c if renal fxn remains stable/allows continued use - will begin glucotrol + NPH - glucophage to be added at a later date (due to recent contrast exposure) - RN to educate pt on dosing insulin and checking CBGs - diabetic education as outpt  Will be able to obtain Reli-On brand of insulin and $10 glucometer lancets and strips. Have also ordered Home Health RN to continue teaching and social worker for further aid.    Acute renal failure  Stable after cath  Unknown lipid status  based on DM and active CAD pt qualifies for treatment regardless   Hypokalemia  Improved - Mg is normal   USAP / NSTEMI  Care per primary team [facility])   Malignant HTN / HTN emergency / HTN crisis  Care as per primary team [employer] [facility])   Coronary artery bypass grafting x4 2009  LIMA to the LAD vein graft to the diagonal branch and saphenous vein graft to the circumflex branch and saphenous vein graft to distal right coronary artery    [facility] [phone] 03/11/2012, 11:06 AM  LOS: 4 days

## 2012-03-11 NOTE — Progress Notes (Signed)
Pt education done. Instructed pt on how to give insulin and pt returned demonstration by given his insulin coverage. Pt and wife both viewed 2 videos on DM education. Pt and wife state that they felt it was helpful.

## 2012-03-11 NOTE — Progress Notes (Signed)
Inpatient Diabetes Program Recommendations  AACE/ADA: New Consensus Statement on Inpatient Glycemic Control (2009)  Target Ranges:  Prepandial:   less than 140 mg/dL      Peak postprandial:   less than 180 mg/dL (1-2 hours)      Critically ill patients:  140 - 180 mg/dL   Referral received for OP diabetes education.  Diabetes Coordinator spoke with patient and wife about attending OP classes.  Patient is not interested at this time but may consider at a later date.  Basic inpatient education by bedside RN ordered.   Basic s/s hypo and hyperglycemia and treatment were discussed as well as CBG monitoring.  Glucose meter RX left for MD to sign.  Information given to patient for ReliOn NPH and meter/supplies.  Patient is to call the [facility] clinic to schedule follow-up appointment.  Information was given by CM.    Thank you  Raoul Pitch [facility] Inpatient Diabetes Coordinator [phone]

## 2012-03-11 NOTE — Progress Notes (Signed)
[facility]  Subjective: No Complaints.  Sitting comfortably in chair.  Objective: Vital signs in last 24 hours: Temp:  [98.3 F (36.8 C)-100 F (37.8 C)] 99 F (37.2 C) (07/02 0400) Pulse Rate:  [83-96] 90  (07/02 0600) Resp:  [14-26] 23  (07/02 0000) BP: (105-143)/(69-106) 135/92 mmHg (07/02 0700) SpO2:  [94 %-99 %] 97 % (07/02 0600) Last BM Date: 03/10/12  Intake/Output from previous day: 07/01 0701 - 07/02 0700 In: 693.2 [I.V.:693.2] Out: 700 [Urine:700] Intake/Output this shift:    Medications Current Facility-Administered Medications  Medication Dose Route Frequency Provider Last Rate Last Dose  . 0.9 %  sodium chloride infusion  1 mL/kg/hr Intravenous Continuous Leonie Man, MD 91.7 mL/hr at 03/10/12 1718 1 mL/kg/hr at 03/10/12 1718  . 0.9 %  sodium chloride infusion  250 mL Intravenous Continuous Leonie Man, MD      . acetaminophen (TYLENOL) tablet 650 mg  650 mg Oral Q4H PRN Leonie Man, MD      . aspirin chewable tablet 324 mg  324 mg Oral [address] [place], [place]   324 mg at 03/10/12 0836  . aspirin chewable tablet 81 mg  81 mg Oral Daily Leonie Man, MD   81 mg at 03/09/12 0905  . atorvastatin (LIPITOR) tablet 80 mg  80 mg Oral q1800 Leonie Man, MD   80 mg at 03/10/12 1733  . bivalirudin (ANGIOMAX) 250 MG injection           . carvedilol (COREG) tablet 12.5 mg  12.5 mg Oral BID WC Leonie Man, MD   12.5 mg at 03/11/12 0729  . fentaNYL (SUBLIMAZE) 0.05 MG/ML injection           . glipiZIDE (GLUCOTROL) tablet 10 mg  10 mg Oral QAC breakfast Cherene Altes, MD      . heparin 2-0.9 UNIT/ML-% infusion           . insulin aspart (novoLOG) injection 0-15 Units  0-15 Units Subcutaneous TID WC Pixie Casino, MD   3 Units at 03/11/12 0732  . insulin aspart (novoLOG) injection 0-5 Units  0-5 Units Subcutaneous QHS Cherene Altes, MD      . insulin NPH (HUMULIN N,NOVOLIN N) injection 20 Units  20 Units  Subcutaneous BID AC Cherene Altes, MD   20 Units at 03/11/12 [phone]  . isosorbide mononitrate (IMDUR) 24 hr tablet 30 mg  30 mg Oral Daily Leonie Man, MD   30 mg at 03/10/12 1014  . lidocaine (XYLOCAINE) 1 % injection           . lisinopril (PRINIVIL,ZESTRIL) tablet 10 mg  10 mg Oral Daily Leonie Man, MD      . midazolam (VERSED) 2 MG/2ML injection           . morphine 2 MG/ML injection 2 mg  2 mg Intravenous Q1H PRN Leonie Man, MD      . nitroGLYCERIN (NTG ON-CALL) 0.2 mg/mL injection           . ondansetron (ZOFRAN) injection 4 mg  4 mg Intravenous Q6H PRN Leonie Man, MD      . potassium chloride SA (K-DUR,KLOR-CON) CR tablet 40 mEq  40 mEq Oral Once Erlene Quan, [place]   40 mEq at 03/10/12 0830  . prasugrel (EFFIENT) tablet 10 mg  10 mg Oral Daily Leonie Man, MD   10 mg at 03/10/12 1014  .  sodium chloride 0.9 % injection 3 mL  3 mL Intravenous Q12H Leonie Man, MD   6 mL at 03/10/12 2325  . sodium chloride 0.9 % injection 3 mL  3 mL Intravenous PRN Leonie Man, MD      . DISCONTD: 0.9 %  sodium chloride infusion  250 mL Intravenous Continuous Pixie Casino, MD 10 mL/hr at 03/08/12 2000 250 mL at 03/08/12 2000  . DISCONTD: 0.9 %  sodium chloride infusion  250 mL Intravenous PRN Tarri Fuller, PA      . DISCONTD: 0.9 %  sodium chloride infusion  1 mL/kg/hr Intravenous Continuous Tarri Fuller, PA      . DISCONTD: acetaminophen (TYLENOL) tablet 650 mg  650 mg Oral Q4H PRN Leonie Man, MD      . DISCONTD: bivalirudin (ANGIOMAX) 5 mg/mL in sodium chloride 0.9 % 50 mL infusion  0.25 mg/kg/hr Intravenous Continuous Pixie Casino, MD      . DISCONTD: heparin ADULT infusion 100 units/mL (25000 units/250 mL)  1,450 Units/hr Intravenous Continuous Anh P Pham, PHARMD 14.5 mL/hr at 03/09/12 2000 1,450 Units/hr at 03/09/12 2000  . DISCONTD: insulin aspart (novoLOG) injection 0-15 Units  0-15 Units Subcutaneous TID WC Cherene Altes, MD      . DISCONTD: insulin aspart  (novoLOG) injection 0-20 Units  0-20 Units Subcutaneous TID WC Cherene Altes, MD   11 Units at 03/10/12 0855  . DISCONTD: insulin aspart (novoLOG) injection 0-5 Units  0-5 Units Subcutaneous QHS Cherene Altes, MD   2 Units at 03/09/12 2222  . DISCONTD: insulin aspart (novoLOG) injection 4 Units  4 Units Subcutaneous TID WC Cherene Altes, MD   4 Units at 03/09/12 1747  . DISCONTD: insulin glargine (LANTUS) injection 20 Units  20 Units Subcutaneous QHS Cherene Altes, MD   10 Units at 03/09/12 2221  . DISCONTD: lisinopril (PRINIVIL,ZESTRIL) tablet 5 mg  5 mg Oral Daily Leonie Man, MD   5 mg at 03/10/12 1015  . DISCONTD: morphine 2 MG/ML injection 2 mg  2 mg Intravenous Q1H PRN Leonie Man, MD      . DISCONTD: nitroGLYCERIN 0.2 mg/mL in dextrose 5 % infusion  2-200 mcg/min Intravenous Continuous Leonie Man, MD 3 mL/hr at 03/08/12 1300 10 mcg/min at 03/08/12 1300  . DISCONTD: ondansetron (ZOFRAN) injection 4 mg  4 mg Intravenous Q6H PRN Leonie Man, MD      . DISCONTD: pantoprazole (PROTONIX) EC tablet 40 mg  40 mg Oral Q0600 Leonie Man, MD   40 mg at 03/09/12 0904  . DISCONTD: sodium chloride 0.9 % injection 3 mL  3 mL Intravenous Q12H Tarri Fuller, PA      . DISCONTD: sodium chloride 0.9 % injection 3 mL  3 mL Intravenous PRN Tarri Fuller, PA        PE: General appearance: alert, cooperative and no distress Lungs: clear to auscultation bilaterally Heart: regular rate and rhythm, S1, S2 normal, no murmur, click, rub or gallop Extremities: No LEE Pulses: 2+ and symmetric Neuro: Grossly intact Skin: Warm and dry. Cath site: Very small hematoma in right groin.  No ecchymosis.  Mildly tender  Lab Results:   Basename 03/10/12 0418 03/09/12 0555  WBC 7.3 7.1  HGB 14.7 14.6  HCT 43.1 41.6  PLT 150 143*   BMET  Basename 03/11/12 0610 03/10/12 0418 03/09/12 0555  NA 142 139 139  K 3.9 3.4* 3.2*  CL 106 106 106  CO2 22 20 21   GLUCOSE 190* 168* 269*  BUN 15  17 19   CREATININE 1.34 1.28 1.29  CALCIUM 9.6 9.3 9.2   PT/INR  Basename 03/10/12 0418  LABPROT 15.2  INR 1.18   Studies/Results: PRE-OPERATIVE DIAGNOSIS: CAD  Status post non-STEMI with significant residual disease following PCI to a totally occluded SVG to OM  POST-OPERATIVE DIAGNOSIS:  Successful PTCA with Angiosculpt Atherectomy balloon - reducing a 90% lesion to 30-40% lesion  Successful extensive stenting of the vein graft to the RCA using 3 overlapping Promus DES stents described below PROCEDURE: Procedure(s) (LRB):  PERCUTANEOUS CORONARY STENT INTERVENTION (PCI-S) (N/A)  SURGEON: Surgeon(s) and Role:  * Leonie Man, MD - Primary  EBL: Less than 20 mL  Devices: 6 [place] sheath--RFA; 6 [place] MP 1 Guide catheter  Filter wire -- predilation balloon: Emerge Monorail 2.5 mm x 15 mm  Promus Element DES 3.0 mm x 80mm -- 12 ATM for 30 seconds -- postdilated with a [place] Quantum apex 3.5 mm x 20 mm balloon  At this point, it appeared that the stent and shifted distally uncovering the most proximal portion of the lead of the target lesion - a second stent was to  Promus Element DES 3.0 mm x 8 mm -- 14 ATM for 46 sec; post dilated with a [place] Quantum apex 3.5 mm 20 mm balloon  Attempted filter wire retrieval - lead to further distal tracking of the stent. -- The decision was made to post dilate stents to 4 mm using a [place] Quantum apex 4.0 mm x 20 mm.  The filter wire was successfully retrieved, with the proximal part of lesion still uncovered.  BMW wire advanced into the RPDA  Angiosculpt Atherectomy Balloon 2.0 mm 10 mm -- dilated to 6 ATM for 90 seconds; final target diameter of 2 mm -- resulted in residual 30-40% lesion  Proximal Vein Graft Stent: Promus Element 4.0 mm x 34mm - 12 ATM for 45 sec;  at the stent overlap -- 16 ATM for 60 sec -- final diameter and the segment was 4.2 mm  wire removed Final angiography revealed excellent stent apposition with no residual uncovered lesion and  no distal or proximal dissection. The PDA residual 30-40% lesion.  ANESTHESIA: local and IV sedation; 2 mg IV Versed, 50 mcg IV fentanyl  MEDICATIONS USED: Angiomax bolus and drip  Sheath removal : In CCU with manual pressure 2 hours post Angiomax.  DICTATION: .Note written in EPIC  PLAN OF CARE: Observation overnight with plan for d/c tomorrow.  Continue dual antiplatelet therapy for a minimum 1 year  We'll attempt to schedule for Life Vest for discharge given his EF 30-35% with ischemic cardiomyopathy.  Consider discharge on by mouth Lasix  Will increase lisinopril to 10 mg for tomorrow for additional afterload reduction He will followup with either myself or Dr. Claiborne Billings in one to 2 weeks. If not, he can be scheduled with physician extender. PATIENT DISPOSITION: PACU - hemodynamically stable.  The patient stable before, during, and after to proceed -- he tolerated it well.  There was a minor complication while retrieving a distal protection device, the stent traveled distally uncovering the previously covered lesion requiring 2 additional proximal stents to be placed and postdilated significantly. There was no residual complication following this procedure. Delay start of Pharmacological VTE agent (>24hrs) due to surgical blood loss or risk of bleeding: not applicable  Maylon Sailors W, M.D., M.S.  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  EF 30-35% .  Will need Zoll life-vest.  Paperwork faxed this AM.  BP and HR  stable.  Improved K+.  NSR RAD, PVCs on tele.  Posible DC today.  He looks good enough to go.    LOS: 4 days    [name], BRYAN 03/11/2012 7:40 AM  I have seen& examined the pt today, & reviewed the chart. I agree with Mr. Amador Cunas findings, examination & recommendations.  He is on stable doses of Coreg & Lisinopril as well as Imdur.  With his EF of 30-35% and elevated filling pressures, would d/c with low dose lasix ~20mg  daily to avoid CHF once he is back to home eating. [facility] consultation. He is getting fitted for LifeVest now.  BP & exam look much better.  Groin stable.  No active SSx of CHF, no further CP. Tolerated 2nd complicated PCI procedure yesterday -- now has best chance at some inferior-inferolateral LV recovery.  He will need close f/u in ~1-2 weeks for cardiomyopathy f/u -> can see physician extender, then ROV with me in ~6-8 weeks.  Thanks to [facility] service for assistance with starting DM Rx.  We will follow their recommendations for discharge.  He is ready for discharge today after finished LifeVest fitting.  Leonie Man, M.D., M.S. [facility] [address] [place], [place]  [address]  [phone] Pager # [phone]  03/11/2012 3:29 PM

## 2012-03-14 NOTE — CV Procedure (Addendum)
[facility] PERCUTANEOUS CORONARY INTERVENTION REPORT  NAME:  [name]   MRN: [id number] DOB:  1946-03-24   ADMIT DATE: 03/07/2012  INTERVENTIONAL CARDIOLOGIST: Leonie Man, M.D., MS PRIMARY CARE PROVIDER: William Hamburger, MD PRIMARY CARDIOLOGIST: Leonie Man, M.D., MS   PATIENT:  [name] is a 66 y.o. male with a history of CAD-CABG in 2009, DM-poorly controlled, and HTN who was admitted 6/28 with Hypertensive Emergency with NSTEMI.  He was taken urgently to the cath lab - rays done at a totally occluded SVG to OM that was after long intervention finally re-canalized with final placement of a 2.5 mm BMS stent. He also significant residual lesions in the SVG to RCA as well as in the RPDA. He is now referred for days PCI of these lesions.  PRE-OPERATIVE DIAGNOSIS:    Recent NSTEMI with PCI to SVG-OM, with significant residual CAD in SVG-RCA and native RPDA.  PROCEDURES PERFORMED:    Successful PTCA Atherectomy of the RPDA 90% lesion reducing it to ~30% using an Angiosculpt Atherectomy balloon.  Successful, complex PCI on the SVG-RCA with 3 overlapping Promus Element DES, post-dilated to ~4.0 mm.  PROCEDURE: Consent: The procedure with Risks/Benefits/Alternatives and Indications was reviewed with the patient (and family).  All questions were answered.    Risks / Complications include, but not limited to: Death, MI, CVA/TIA, VF/VT (with defibrillation), Bradycardia (need for temporary pacer placement), contrast induced nephropathy, bleeding / bruising / hematoma / pseudoaneurysm, vascular or coronary injury (with possible emergent CT or Vascular Surgery), adverse medication reactions, infection.    The patient and family voicedunderstanding and agree to proceed.     Consent for signed by MD and patient with RN witness -- placed on chart.  PROCEDURE: The patient was brought to the [facility] in the  fasting state and prepped and draped in the usual sterile fashion for Right groin access. Sterile technique was used including antiseptics, cap, gloves, gown, hand hygiene, mask and sheet.  Skin prep: Chlorhexidine;  Time Out: Verified patient identification, verified procedure, site/side was marked, verified correct patient position, special equipment/implants available, medications/allergies/relevent history reviewed, required imaging and test results available.  Performed  Access: Right Common Femoral Artery; 6 Fr Sheath; Fluoroscopically  Guided, Modified Seldinger Technique  Diagnostic:  MP1 Guide  SVG-RCA -- large-caliber graft with tandem 50-60% followed by 70% irregular lesions in the distal portion of the mid graft. The graft touches down to the PDA which has a proximal 90% focal lesion in a small moderate caliber vessel. There is retrograde flow up the bifurcation minimal flow up the native RCA perfusing antegrade into the RPL system where there is a 60% lesion at the initial bifurcation.  Percutaneous Coronary Intervention:    Lesion #1 - mid SVG 50-60% followed by 70% irregular lesions;   Lesion #2: proximal RPDA   Guide: 6 Fr   MP1   Guidewire: Fliter Wire placed in the distal SVG, exchanged for a Prowater wire for RPDA PTCA after initial SVG PCI. Lesion #1  Predilation Balloon: E,erge MR  2.5 mm x 15 mm; 8 Atm x 30 Sec, 8 Atm x 60 Sec  Stent: #1: Promus Element 3.0 mm x 32 mm; 12 Atm x 33 Sec -- at this point, the stent appeared to be appropriately sized for the vessel.  Post-dilation Balloon #1: [place] Quantum Apex 3.5 m  mm x 20 mm; 8 Atm x 30 Sec x 2 in mid stent,  12 Atm x  Proximal stent; then distally 6 Atm x 35 sec At this point, it appeared that the stent and shifted distally uncovering the most proximal portion of the lead of the target lesion - a second stent was placed to cover the original proximal lesion.  Stent #2: Promus Element DES 3.0 mm x 8 mm -- 14 ATM for 46  sec;   Post dilation balloon: [place] Quantum Apex 3.5 mm 20 mm; 14 Atm x 46  At this time, the Filter-Wire retrieval device was advanced, but was unsuccessful in crossing into the first stent, the Bent-tip device was then used, but the stents appeared to have shifted even further distally.  The 3.5 Post-Dilation balloon was then re-introduced: 14 Atm x 30 sec x 3 from distal to proximal with 16 x 45 most proximally.  After yet another failed attempt at filter wire retrieval - lead to further distal tracking of the stent. -- The decision was made to post dilate stents to 4.0 .  Post dilation balloon #2: [place] Quantum apex 4.0 mm x 20 mm: 12 Atm x 45 sec x 2 proximal to distal, 9 Atm x 45 sec distal.  The filter wire was successfully retrieved, with the proximal part of lesion still uncovered.  The Prowater wire was then advanced through the SVG into the RPDA Lesion #2  Angiosculpt Atherectomy Balloon 2.0 mm 10 mm -- 6 ATM x 90 sec; final target diameter of 2 mm   resulted in residual 30-40% lesion Back to Lesion #1 - uncovered proximal portion.  Stent #3: Promus Element 4.0 mm x 70mm - 12 ATM for 45 sec;   at the stent overlap -- 16 ATM for 60 sec -- final diameter and the segment was 4.2 mm   Final angiography revealed excellent stent apposition with no residual uncovered lesion and no distal or proximal dissection. The PDA residual 30-40% lesion.  The sheath was sutured into place, to be removed in the CCU 2 hours after Angiomax was discontinued.  ANESTHESIA:   Local Lidocaine 18 ml SEDATION:  2 mg IV Versed, 50 mcg IV fentanyl ; Premedication: 5 mg PO Valium MEDICATIONS:  Anticoagulation: Angiomax Bolus and drip during PCI  EBL:   < 20 ml  PATIENT DISPOSITION:    The patient was transferred to the PACU holding area in a hemodynamicaly stable, chest pain free condition.  The patient tolerated the procedure well, and there were no complications beyond the distal movement of the  original SVG stent with difficulty removing the Filter Wire.    The patient was stable before, during, and after the procedure.  POST-OPERATIVE DIAGNOSIS:    Successful PTCA with Angiosculpt Atherectomy balloon - reducing a 90% lesion to 30-40%.  Successful extensive stenting of the vein graft to the RCA using 3 overlapping Promus DES stents described above.  PLAN OF CARE:  Observation overnight with plan for d/c tomorrow.   Continue dual antiplatelet therapy for a minimum 1 year   We'll attempt to schedule for Life Vest for discharge given his EF 30-35% with ischemic cardiomyopathy.   Consider discharge on by mouth Lasix   Will increase lisinopril to 10 mg for tomorrow for additional afterload reduction  He will followup with either myself or Dr. Claiborne Billings in one to 2 weeks. If not, he can be scheduled with physician extender   Leonie Man, M.D., M.S. [facility] [address] [place], [place]  [address]  [phone]  03/14/2012  7:29 AM

## 2012-04-28 ENCOUNTER — Observation Stay (HOSPITAL_COMMUNITY): Payer: Medicaid Other

## 2012-04-28 ENCOUNTER — Inpatient Hospital Stay (HOSPITAL_COMMUNITY)
Admission: EM | Admit: 2012-04-28 | Discharge: 2012-05-01 | DRG: 065 | Disposition: A | Discharge status: Rehabilitation Facility | Payer: Medicaid Other | Attending: Internal Medicine | Admitting: Internal Medicine

## 2012-04-28 ENCOUNTER — Encounter (HOSPITAL_COMMUNITY): Payer: Self-pay | Admitting: Emergency Medicine

## 2012-04-28 DIAGNOSIS — R739 Hyperglycemia, unspecified: Secondary | ICD-10-CM | POA: Diagnosis present

## 2012-04-28 DIAGNOSIS — N189 Chronic kidney disease, unspecified: Secondary | ICD-10-CM | POA: Diagnosis present

## 2012-04-28 DIAGNOSIS — R29898 Other symptoms and signs involving the musculoskeletal system: Secondary | ICD-10-CM | POA: Diagnosis present

## 2012-04-28 DIAGNOSIS — G459 Transient cerebral ischemic attack, unspecified: Secondary | ICD-10-CM

## 2012-04-28 DIAGNOSIS — I252 Old myocardial infarction: Secondary | ICD-10-CM

## 2012-04-28 DIAGNOSIS — Z951 Presence of aortocoronary bypass graft: Secondary | ICD-10-CM

## 2012-04-28 DIAGNOSIS — R42 Dizziness and giddiness: Secondary | ICD-10-CM | POA: Diagnosis present

## 2012-04-28 DIAGNOSIS — Z87891 Personal history of nicotine dependence: Secondary | ICD-10-CM

## 2012-04-28 DIAGNOSIS — I169 Hypertensive crisis, unspecified: Secondary | ICD-10-CM

## 2012-04-28 DIAGNOSIS — I161 Hypertensive emergency: Secondary | ICD-10-CM | POA: Diagnosis present

## 2012-04-28 DIAGNOSIS — I6350 Cerebral infarction due to unspecified occlusion or stenosis of unspecified cerebral artery: Principal | ICD-10-CM

## 2012-04-28 DIAGNOSIS — I2510 Atherosclerotic heart disease of native coronary artery without angina pectoris: Secondary | ICD-10-CM | POA: Diagnosis present

## 2012-04-28 DIAGNOSIS — E119 Type 2 diabetes mellitus without complications: Secondary | ICD-10-CM | POA: Diagnosis present

## 2012-04-28 DIAGNOSIS — Z7982 Long term (current) use of aspirin: Secondary | ICD-10-CM

## 2012-04-28 DIAGNOSIS — I129 Hypertensive chronic kidney disease with stage 1 through stage 4 chronic kidney disease, or unspecified chronic kidney disease: Secondary | ICD-10-CM | POA: Diagnosis present

## 2012-04-28 DIAGNOSIS — Z79899 Other long term (current) drug therapy: Secondary | ICD-10-CM

## 2012-04-28 DIAGNOSIS — E785 Hyperlipidemia, unspecified: Secondary | ICD-10-CM | POA: Diagnosis present

## 2012-04-28 DIAGNOSIS — R7309 Other abnormal glucose: Secondary | ICD-10-CM

## 2012-04-28 DIAGNOSIS — I639 Cerebral infarction, unspecified: Secondary | ICD-10-CM | POA: Diagnosis present

## 2012-04-28 DIAGNOSIS — M129 Arthropathy, unspecified: Secondary | ICD-10-CM | POA: Diagnosis present

## 2012-04-28 HISTORY — PX: OTHER SURGICAL HISTORY: SHX169

## 2012-04-28 LAB — BASIC METABOLIC PANEL WITH GFR
Calcium: 10.7 mg/dL — ABNORMAL HIGH (ref 8.4–10.5)
Creatinine, Ser: 1.37 mg/dL — ABNORMAL HIGH (ref 0.50–1.35)
GFR calc Af Amer: 62 mL/min — ABNORMAL LOW (ref >90)
GFR calc non Af Amer: 53 mL/min — ABNORMAL LOW (ref >90)

## 2012-04-28 LAB — GLUCOSE, CAPILLARY: Glucose-Capillary: 180 mg/dL — ABNORMAL HIGH (ref 70–99)

## 2012-04-28 LAB — COMPREHENSIVE METABOLIC PANEL
ALT: 12 U/L (ref 0–53)
AST: 12 U/L (ref 0–37)
Albumin: 4.4 g/dL (ref 3.5–5.2)
Alkaline Phosphatase: 66 U/L (ref 39–117)
Potassium: 3.6 mEq/L (ref 3.5–5.1)
Sodium: 141 mEq/L (ref 135–145)
Total Protein: 8.3 g/dL (ref 6.0–8.3)

## 2012-04-28 LAB — CBC
HCT: 44.6 % (ref 39.0–52.0)
Hemoglobin: 15.6 g/dL (ref 13.0–17.0)
MCH: 27.3 pg (ref 26.0–34.0)
MCHC: 35 g/dL (ref 30.0–36.0)
MCV: 78.1 fL (ref 78.0–100.0)
Platelets: 184 K/uL (ref 150–400)
RBC: 5.71 MIL/uL (ref 4.22–5.81)
RDW: 13.9 % (ref 11.5–15.5)
WBC: 9.7 10*3/uL (ref 4.0–10.5)

## 2012-04-28 LAB — BASIC METABOLIC PANEL
BUN: 20 mg/dL (ref 6–23)
CO2: 25 mEq/L (ref 19–32)
Chloride: 104 mEq/L (ref 96–112)
Glucose, Bld: 180 mg/dL — ABNORMAL HIGH (ref 70–99)
Potassium: 3.6 mEq/L (ref 3.5–5.1)
Sodium: 142 mEq/L (ref 135–145)

## 2012-04-28 LAB — PROTIME-INR
INR: 1.04 (ref 0.00–1.49)
Prothrombin Time: 13.8 s (ref 11.6–15.2)

## 2012-04-28 LAB — LIPID PANEL
HDL: 39 mg/dL — ABNORMAL LOW (ref >39)
LDL Cholesterol: 131 mg/dL — ABNORMAL HIGH (ref 0–99)
Total CHOL/HDL Ratio: 4.8 RATIO

## 2012-04-28 MED ORDER — METFORMIN HCL 500 MG PO TABS: 500.0000 mg | ORAL_TABLET | Freq: Two times a day (BID) | ORAL | Status: DC

## 2012-04-28 MED ORDER — PRASUGREL HCL 10 MG PO TABS: 10.0000 mg | ORAL_TABLET | Freq: Every day | ORAL | Status: DC

## 2012-04-28 MED ORDER — SODIUM CHLORIDE 0.9 % IV SOLN
INTRAVENOUS | Status: DC
  Administered 2012-04-29: 10:00:00 via INTRAVENOUS
  Administered 2012-04-29: 100 mL/h via INTRAVENOUS
  Administered 2012-04-30: 09:00:00 via INTRAVENOUS

## 2012-04-28 MED ORDER — ATORVASTATIN CALCIUM 40 MG PO TABS: 40.0000 mg | ORAL_TABLET | Freq: Every day | ORAL | Status: DC

## 2012-04-28 MED ORDER — SENNOSIDES-DOCUSATE SODIUM 8.6-50 MG PO TABS
1.0000 | ORAL_TABLET | Freq: Every evening | ORAL | Status: DC | PRN
  Filled 2012-04-28: qty 1

## 2012-04-28 MED ORDER — LABETALOL HCL 5 MG/ML IV SOLN
20.0000 mg | INTRAVENOUS | Status: DC | PRN
  Administered 2012-04-29 – 2012-05-01 (×3): 20 mg via INTRAVENOUS
  Filled 2012-04-28 (×4): qty 4

## 2012-04-28 MED ORDER — ISOSORBIDE MONONITRATE ER 30 MG PO TB24: 30.0000 mg | ORAL_TABLET | Freq: Every day | ORAL | Status: DC

## 2012-04-28 MED ORDER — CARVEDILOL 12.5 MG PO TABS
12.5000 mg | ORAL_TABLET | Freq: Two times a day (BID) | ORAL | Status: DC
  Administered 2012-04-28 – 2012-05-01 (×6): 12.5 mg via ORAL
  Filled 2012-04-28 (×8): qty 1

## 2012-04-28 MED ORDER — LISINOPRIL 20 MG PO TABS: 20.0000 mg | ORAL_TABLET | Freq: Every day | ORAL | Status: DC

## 2012-04-28 MED ORDER — INSULIN ASPART 100 UNIT/ML ~~LOC~~ SOLN
0.0000 [IU] | SUBCUTANEOUS | Status: DC
  Administered 2012-04-29: 2 [IU] via SUBCUTANEOUS
  Administered 2012-04-29: 3 [IU] via SUBCUTANEOUS
  Administered 2012-04-29: 5 [IU] via SUBCUTANEOUS
  Administered 2012-04-29: 2 [IU] via SUBCUTANEOUS
  Administered 2012-04-29: 3 [IU] via SUBCUTANEOUS
  Administered 2012-04-30: 5 [IU] via SUBCUTANEOUS
  Administered 2012-04-30: 3 [IU] via SUBCUTANEOUS
  Administered 2012-04-30: 2 [IU] via SUBCUTANEOUS
  Administered 2012-04-30 (×2): 3 [IU] via SUBCUTANEOUS

## 2012-04-28 MED ORDER — LABETALOL HCL 5 MG/ML IV SOLN: 20.0000 mg | INTRAVENOUS | Status: DC | PRN

## 2012-04-28 MED ORDER — ATORVASTATIN CALCIUM 40 MG PO TABS
40.0000 mg | ORAL_TABLET | Freq: Every day | ORAL | Status: DC
  Administered 2012-04-29 – 2012-04-30 (×2): 40 mg via ORAL
  Filled 2012-04-28 (×4): qty 1

## 2012-04-28 MED ORDER — ASPIRIN 81 MG PO CHEW
81.0000 mg | CHEWABLE_TABLET | Freq: Every day | ORAL | Status: DC
  Administered 2012-04-29 – 2012-05-01 (×3): 81 mg via ORAL
  Filled 2012-04-28 (×3): qty 1

## 2012-04-28 MED ORDER — FUROSEMIDE 20 MG PO TABS: 20.0000 mg | ORAL_TABLET | Freq: Every day | ORAL | Status: DC

## 2012-04-28 MED ORDER — CLOPIDOGREL BISULFATE 75 MG PO TABS
75.0000 mg | ORAL_TABLET | Freq: Every day | ORAL | Status: DC
  Administered 2012-04-29 – 2012-05-01 (×3): 75 mg via ORAL
  Filled 2012-04-28 (×4): qty 1

## 2012-04-28 MED ORDER — NITROGLYCERIN 0.4 MG SL SUBL: 0.4000 mg | SUBLINGUAL_TABLET | SUBLINGUAL | Status: DC | PRN

## 2012-04-28 MED ORDER — ASPIRIN EC 81 MG PO TBEC: 81.0000 mg | DELAYED_RELEASE_TABLET | Freq: Every day | ORAL | Status: DC

## 2012-04-28 MED ORDER — ISOSORBIDE MONONITRATE ER 30 MG PO TB24
30.0000 mg | ORAL_TABLET | Freq: Every day | ORAL | Status: DC
  Administered 2012-04-29 – 2012-05-01 (×3): 30 mg via ORAL
  Filled 2012-04-28 (×3): qty 1

## 2012-04-28 MED ORDER — ACETAMINOPHEN 325 MG PO TABS: 650.0000 mg | ORAL_TABLET | ORAL | Status: DC | PRN

## 2012-04-28 NOTE — Progress Notes (Signed)
VASCULAR LAB PRELIMINARY  PRELIMINARY  PRELIMINARY  PRELIMINARY  Carotid Dopplers completed.    Preliminary report:  There is no ICA stenosis.  Right vertebral artery flow not insonated. Left vertebral flow is antegrade.  [name], 04/28/2012, 5:33 PM

## 2012-04-28 NOTE — ED Provider Notes (Signed)
I saw and evaluated the patient, reviewed the resident's note and I agree with the findings and plan.  .  Pt with dizziness, right arm and leg heaviness that is now resolved.  Pt is hypertensive, but no evidence of ongoing HTN crisis.  Will place in CDU under TIA protocol.  Pt with multiple risks including CAD, HTN, DM.  Pt and family understand and agree with plan.  Will allow permissive HTN now and simply continue usual antiHTN's for now.    EKG at time 14:25, shows sinus rhythm at a rate of 99. RS are prime noted in V1. Right axis deviation is noted. Possible remote inferior infarct with also lateral Q waves noted. No acute ST changes noted. Similar in appearance to EKG from 03/11/2012.  Saddie Benders. Yesenia Fontenette, MD 04/28/12 1725

## 2012-04-28 NOTE — Progress Notes (Signed)
Event: [name] being admitted for acute CVA that has been associated w/ 24 hr h/o dizziness. Dr Hal Hope requested I come evaluate pt for persistently elevated BP that has not responded to attempts in ED to lower BP.  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 Who currently states he is feeling better. Feels the dizziness has improved and is less aware of the (R) sided weakness. Denies other pain or discomfort. RN reports that BP at [place] was 192/113 just prior to receiving PO Coreg.   Objective: Pt noted lying in bed in no acute distress. Pt is AA&O x 3. PEARLA, no obvious facial droop, Grips (L>R) (See full neuro exam per Dr Doy Mince) T-98.4, HR-95, R-16 w/ 02 sats of 100% on R/A. BBS CTA, HR 95 w/ RRR w/o M/G/R, abd soft nt w/ normal bs in all quads. BP on cardiac monitor currently 170/116.  Assessment/plan:  1. Persistent HTN- In clinical setting of pt w/ acute CVA. Dr Hal Hope has seen pt and has discussed with Dr Doy Mince who is requesting more aggressive control of BP. Request possible Cardene drip. Dr Hal Hope spoke w/ Dr Alva Garnet w/ C[facility] who has agreed to consult for further management of persistent HTN.  I have discussed plan w/ pt and family who are agreeable.

## 2012-04-28 NOTE — ED Notes (Signed)
Pt c/o increased dizziness and numbness to right sided starting at 69 yesterday; pt sts recent MI; pt noted to be hypertensive

## 2012-04-28 NOTE — ED Provider Notes (Signed)
History     CSN: [id number]  Arrival date & time 04/28/12  55   First MD Initiated Contact with Patient 04/28/12 1459      Chief Complaint  Patient presents with  . Dizziness  . Hypertension  . Numbness    (Consider location/radiation/quality/duration/timing/severity/associated sxs/prior treatment) Patient is a 66 y.o. male presenting with hypertension and neurologic complaint. The history is provided by the patient.  Hypertension This is a chronic problem. The current episode started more than 1 year ago. The problem occurs intermittently. The problem has been gradually worsening. Pertinent negatives include no abdominal pain, chest pain, coughing, diaphoresis, fever, headaches, nausea, neck pain, numbness, sore throat, vertigo, visual change, vomiting or weakness. Nothing aggravates the symptoms.  Neurologic Problem The primary symptoms include dizziness. Primary symptoms do not include headaches, syncope, loss of consciousness, altered mental status, seizures, visual change, paresthesias, loss of sensation, speech change, fever, nausea or vomiting. The symptoms began yesterday. The symptoms are waxing and waning. The symptoms occurred after standing up.  He describes the dizziness as lightheadedness. The dizziness began yesterday. The dizziness has been resolved since its onset. It is a new problem. Dizziness does not occur with tinnitus, nausea, vomiting, weakness or diaphoresis.  Additional symptoms include loss of balance (earlier today, now resolved). Additional symptoms do not include neck stiffness, weakness, photophobia, tinnitus or vertigo. Medical issues also include diabetes and hypertension.    Past Medical History  Diagnosis Date  . Coronary artery disease   . Diabetes mellitus   . Arthritis   . Hypertension   . Hypertensive crisis 03/07/2012  . Unstable angina 03/07/2012  . DM (diabetes mellitus),poorly controlled 03/07/2012  . CAD (coronary artery disease), with  CABG in 2009 after an MI 03/07/2012  . Myocardial infarction     Past Surgical History  Procedure Date  . Cardiac surgery   . Coronary artery bypass graft   . Cardiac catheterization     History reviewed. No pertinent family history.  History  Substance Use Topics  . Smoking status: Former Smoker    Quit date: 09/10/1981  . Smokeless tobacco: Never Used  . Alcohol Use: 0.6 oz/week    1 Glasses of wine per week      Review of Systems  Constitutional: Negative for fever and diaphoresis.  HENT: Negative.  Negative for sore throat, neck pain, neck stiffness and tinnitus.   Eyes: Negative for photophobia.  Respiratory: Negative for cough and shortness of breath.   Cardiovascular: Negative for chest pain, palpitations, leg swelling and syncope.  Gastrointestinal: Negative for nausea, vomiting, abdominal pain, diarrhea and constipation.  Genitourinary: Negative for dysuria.  Neurological: Positive for dizziness, light-headedness and loss of balance (earlier today, now resolved). Negative for vertigo, tremors, speech change, seizures, loss of consciousness, syncope, facial asymmetry, speech difficulty, weakness, numbness, headaches and paresthesias.  Psychiatric/Behavioral: Negative for altered mental status.  All other systems reviewed and are negative.    Allergies  Review of patient's allergies indicates no known allergies.  Home Medications   Current Outpatient Rx  Name Route Sig Dispense Refill  . ACETAMINOPHEN 325 MG PO TABS Oral Take 650 mg by mouth every 4 (four) hours as needed. For pain/headache    . ASPIRIN EC 81 MG PO TBEC Oral Take 81 mg by mouth daily.    . BD GETTING STARTED TAKE HOME KIT: 1/2ML X 30G SYRINGES Other 1 kit by Other route once.    Marland Kitchen CARVEDILOL 12.5 MG PO TABS Oral Take 12.5 mg by  mouth 2 (two) times daily with a meal.    . FUROSEMIDE 20 MG PO TABS Oral Take 20 mg by mouth daily.    . ISOSORBIDE MONONITRATE ER 30 MG PO TB24 Oral Take 30 mg by  mouth daily.    Marland Kitchen LISINOPRIL 20 MG PO TABS Oral Take 20 mg by mouth daily.    Marland Kitchen METFORMIN HCL 500 MG PO TABS Oral Take 500 mg by mouth 2 (two) times daily with a meal.    . PRASUGREL HCL 10 MG PO TABS Oral Take 10 mg by mouth daily.    Marland Kitchen SIMVASTATIN 80 MG PO TABS Oral Take 40 mg by mouth every evening.    Marland Kitchen NITROGLYCERIN 0.4 MG SL SUBL Sublingual Place 0.4 mg under the tongue every 5 (five) minutes as needed. For chest pain      BP 173/120  Pulse 97  Resp 13  SpO2 99%  Physical Exam  Nursing note and vitals reviewed. Constitutional: He is oriented to person, place, and time. He appears well-developed and well-nourished. No distress.  HENT:  Head: Normocephalic and atraumatic.  Eyes: Conjunctivae and EOM are normal. Pupils are equal, round, and reactive to light.  Fundoscopic exam:      The right eye shows no hemorrhage and no papilledema.       The left eye shows no hemorrhage and no papilledema.  Neck: Neck supple.  Cardiovascular: Normal rate, regular rhythm, normal heart sounds and intact distal pulses.   Pulmonary/Chest: Effort normal and breath sounds normal. He has no wheezes. He has no rales.  Abdominal: Soft. He exhibits no distension. There is no tenderness.  Musculoskeletal: Normal range of motion.  Neurological: He is alert and oriented to person, place, and time. He has normal strength. No cranial nerve deficit or sensory deficit. He displays a negative Romberg sign. Coordination and gait normal. GCS eye subscore is 4. GCS verbal subscore is 5. GCS motor subscore is 6.  Skin: Skin is warm and dry.    ED Course  Procedures (including critical care time)  Labs Reviewed  BASIC METABOLIC PANEL - Abnormal; Notable for the following:    Glucose, Bld 180 (*)     Creatinine, Ser 1.37 (*)     Calcium 10.7 (*)     GFR calc non Af Amer 53 (*)     GFR calc Af Amer 62 (*)     All other components within normal limits  GLUCOSE, CAPILLARY - Abnormal; Notable for the following:     Glucose-Capillary 180 (*)     All other components within normal limits  CBC  PROTIME-INR   No results found.   No diagnosis found.    MDM  66 yo male with PMHx of CAD s/p CABG and stenting, DM, HTN, who presents with worsening HTN, lightheadedness on standing and with 2 episodes of feeling that right arm and right leg were not working properly.  Sx started yesterday afternoon.  He had one episode yesterday evening and another episode this morning in which he felt his balance was off and right arm and right leg were not working properly.  Episodes lasted less than one hour.  Sx resolved prior to arrival.  No chest pain, shortness of breath, fever, headache, abdominal pain.  Pt had lisinopril dose recently doubled by PCP.  No other recent medication changes.    AF, hypertensive with BP 170's/110's with vital signs otherwise stable.  Physical exam with nml neuro exam including sensation, strength, and cerebellar  testing.  Nml fundoscopic exam w/o evidence of increased ICP.  Presentation concerning for possible TIA.    Cr 1.37 at pts baseline.  BMP otherwise wnl.  CBC wnl.  ECG NSR, rate 99, RAD, RSR' in V1, nml intrevals, no ST changes.  Similar to prior EKG.  ABCD score 4.  No signs of hypertensive emergency and will allow for permissive HTN at this time.  Will place pt in CDU for TIA protocol with additional imaging.           Renaldo Reel, MD 04/28/12 1726

## 2012-04-28 NOTE — ED Notes (Signed)
EKG performed.

## 2012-04-28 NOTE — ED Notes (Signed)
Pt states that he does have high blood pressure

## 2012-04-28 NOTE — Progress Notes (Signed)
  Echocardiogram 2D Echocardiogram has been performed.  [name] 04/28/2012, 5:37 PM

## 2012-04-28 NOTE — ED Provider Notes (Signed)
5:00 PM Pt currently in MRI  6:23 PM Patient's MRI shows acute subcentimeter infarct.  I have discussed all results with patient and family.  Plan is for admission.  CN II-XII intact, EOMs intact, no pronator drift, grip decreased on right; strength 5/5 in all extremities with exception of right arm that is decreased in comparison to left, sensation intact in all extremities; heel to shin, rapid alternating movements normal; finger to nose abnormal with right hand.     6:44 PM Spoke with Dr Darrick Meigs, [facility] hospitalist, who will admit the patient.    Results for orders placed during the hospital encounter of 04/28/12  CBC      Component Value Range   WBC 9.7  4.0 - 10.5 K/uL   RBC 5.71  4.22 - 5.81 MIL/uL   Hemoglobin 15.6  13.0 - 17.0 g/dL   HCT 44.6  39.0 - 52.0 %   MCV 78.1  78.0 - 100.0 fL   MCH 27.3  26.0 - 34.0 pg   MCHC 35.0  30.0 - 36.0 g/dL   RDW 13.9  11.5 - 15.5 %   Platelets 184  150 - 400 K/uL  BASIC METABOLIC PANEL      Component Value Range   Sodium 142  135 - 145 mEq/L   Potassium 3.6  3.5 - 5.1 mEq/L   Chloride 104  96 - 112 mEq/L   CO2 25  19 - 32 mEq/L   Glucose, Bld 180 (*) 70 - 99 mg/dL   BUN 20  6 - 23 mg/dL   Creatinine, Ser 1.37 (*) 0.50 - 1.35 mg/dL   Calcium 10.7 (*) 8.4 - 10.5 mg/dL   GFR calc non Af Amer 53 (*) >90 mL/min   GFR calc Af Amer 62 (*) >90 mL/min  PROTIME-INR      Component Value Range   Prothrombin Time 13.8  11.6 - 15.2 seconds   INR 1.04  0.00 - 1.49  GLUCOSE, CAPILLARY      Component Value Range   Glucose-Capillary 180 (*) 70 - 99 mg/dL   Comment 1 Documented in Chart     Comment 2 Notify RN    COMPREHENSIVE METABOLIC PANEL      Component Value Range   Sodium 141  135 - 145 mEq/L   Potassium 3.6  3.5 - 5.1 mEq/L   Chloride 104  96 - 112 mEq/L   CO2 25  19 - 32 mEq/L   Glucose, Bld 173 (*) 70 - 99 mg/dL   BUN 19  6 - 23 mg/dL   Creatinine, Ser 1.22  0.50 - 1.35 mg/dL   Calcium 10.5  8.4 - 10.5 mg/dL   Total Protein 8.3  6.0 - 8.3  g/dL   Albumin 4.4  3.5 - 5.2 g/dL   AST 12  0 - 37 U/L   ALT 12  0 - 53 U/L   Alkaline Phosphatase 66  39 - 117 U/L   Total Bilirubin 0.4  0.3 - 1.2 mg/dL   GFR calc non Af Amer 61 (*) >90 mL/min   GFR calc Af Amer 71 (*) >90 mL/min  APTT      Component Value Range   aPTT 31  24 - 37 seconds  LIPID PANEL      Component Value Range   Cholesterol 189  0 - 200 mg/dL   Triglycerides 95  <150 mg/dL   HDL 39 (*) >39 mg/dL   Total CHOL/HDL Ratio 4.8  VLDL 19  0 - 40 mg/dL   LDL Cholesterol 131 (*) 0 - 99 mg/dL   Mr Brain Wo Contrast  04/28/2012  *RADIOLOGY REPORT*  Clinical Data:  Dizziness beginning yesterday.  History of diabetes and hypertension.  MRI HEAD WITHOUT CONTRAST MRA HEAD WITHOUT CONTRAST  Technique:  Multiplanar, multiecho pulse sequences of the brain and surrounding structures were obtained without intravenous contrast. Angiographic images of the head were obtained using MRA technique without contrast.  Comparison:  08/09/2006 CT  MRI HEAD  Findings:  There is a subcentimeter acute infarct affecting the posterior limb internal capsule on the left ( image 15 series 4). There is no associated hemorrhage or mass lesion.  There is no hydrocephalus or extra-axial fluid.  Mild atrophy is present.  Mild chronic microvascular ischemic change is noted in the periventricular and subcortical white matter.  Tiny focus chronic hemorrhage affects the left occipital lobe.  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  There is no midline shift.  The major intracranial vascular structures are patent.  Calvarium and skull base intact.  Upper cervical region unremarkable.  No acute sinus or mastoid disease. Negative orbits.  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  Chronic changes as described.  MRA HEAD  Findings: Mild nonstenotic irregularity left cavernous carotid. Focal 50% stenosis right cavernous  ICA.  Shallow atheromatous outpouching inferior cavernous segment right ICA approximately 2 x 3 mm.  50% stenosis supraclinoid ICA bilaterally.  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  No flow limiting stenosis.  Left vertebral is the sole contributor to the basilar. Right vertebral poorly visualized distally, likely ending in PICA.  There is no proximal stenosis of the anterior, middle, or posterior cerebral arteries.  There is no cerebellar branch occlusion. There is mild irregularity of the distal MCA and PCA branches bilaterally.  No intracranial berry aneurysm  is seen.  IMPRESSION:  No flow limiting stenosis is observed.  Intracranial atherosclerotic change as described.   Original Report Authenticated By: Staci Righter, M.D.    Mr Mra Head/brain Wo Cm  04/28/2012  *RADIOLOGY REPORT*  Clinical Data:  Dizziness beginning yesterday.  History of diabetes and hypertension.  MRI HEAD WITHOUT CONTRAST MRA HEAD WITHOUT CONTRAST  Technique:  Multiplanar, multiecho pulse sequences of the brain and surrounding structures were obtained without intravenous contrast. Angiographic images of the head were obtained using MRA technique without contrast.  Comparison:  08/09/2006 CT  MRI HEAD  Findings:  There is a subcentimeter acute infarct affecting the posterior limb internal capsule on the left ( image 15 series 4). There is no associated hemorrhage or mass lesion.  There is no hydrocephalus or extra-axial fluid.  Mild atrophy is present.  Mild chronic microvascular ischemic change is noted in the periventricular and subcortical white matter.  Tiny focus chronic hemorrhage affects the left occipital lobe.  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  There is no midline shift.  The major intracranial vascular structures are patent.  Calvarium and skull base intact.  Upper cervical region unremarkable.  No acute sinus or mastoid disease. Negative orbits.   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  Chronic changes as described.  MRA HEAD  Findings: Mild nonstenotic irregularity left cavernous carotid. Focal 50% stenosis right cavernous ICA.  Shallow atheromatous outpouching inferior cavernous segment right ICA approximately 2 x 3 mm.  50% stenosis supraclinoid ICA bilaterally.  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  No flow limiting stenosis.  Left vertebral is the sole contributor to the basilar. Right vertebral poorly visualized distally, likely ending in PICA.  There is no proximal stenosis of the anterior, middle, or posterior cerebral arteries.  There is no cerebellar branch occlusion. There is mild irregularity of the distal MCA and PCA branches bilaterally.  No intracranial berry aneurysm  is seen.  IMPRESSION:  No flow limiting stenosis is observed.  Intracranial atherosclerotic change as described.   Original Report Authenticated By: Staci Righter, M.D.       [place], [place] 04/28/12 2100

## 2012-04-28 NOTE — Consult Note (Signed)
Referring Physician: [place]    Chief Complaint: Dizziness and right sided weakness  HPI: [name] is an 66 y.o. male who reports that at about 430PM on yesterday had acute onset of dizziness.  When attempted to get up felt as if his right side was not keeping up with his left side.  Symptoms worsened today and patient presented for evaluation.  Dizziness improved somewhat while in ED.   Patient had a stent placement on 6/28.  Has been on Effient and ASA since that time.    LSN: 04/27/2012  [id number] tPA Given: No: Outside time window  Past Medical History  Diagnosis Date  . Coronary artery disease   . Diabetes mellitus   . Arthritis   . Hypertension   . Hypertensive crisis 03/07/2012  . Unstable angina 03/07/2012  . DM (diabetes mellitus),poorly controlled 03/07/2012  . CAD (coronary artery disease), with CABG in 2009 after an MI 03/07/2012  . Myocardial infarction     Past Surgical History  Procedure Date  . Cardiac surgery   . Coronary artery bypass graft   . Cardiac catheterization     History reviewed. No pertinent family history. Social History:  reports that he quit smoking about 30 years ago. He has never used smokeless tobacco. He reports that he drinks about .6 ounces of alcohol per week. He reports that he does not use illicit drugs.  Allergies: No Known Allergies  Medications: I have reviewed the patient's current medications. Prior to Admission:  Current outpatient prescriptions:acetaminophen (TYLENOL) 325 MG tablet, Take 650 mg by mouth every 4 (four) hours as needed. For pain/headache, Disp: , Rfl: ;  aspirin EC 81 MG tablet, Take 81 mg by mouth daily., Disp: , Rfl: ;  bd getting started take home kit MISC, 1 kit by Other route once., Disp: , Rfl: ;  carvedilol (COREG) 12.5 MG tablet, Take 12.5 mg by mouth 2 (two) times daily with a meal., Disp: , Rfl:  furosemide (LASIX) 20 MG tablet, Take 20 mg by mouth daily., Disp: , Rfl: ;  isosorbide mononitrate (IMDUR) 30 MG 24  hr tablet, Take 30 mg by mouth daily., Disp: , Rfl: ;  lisinopril (PRINIVIL,ZESTRIL) 20 MG tablet, Take 20 mg by mouth daily., Disp: , Rfl: ;  metFORMIN (GLUCOPHAGE) 500 MG tablet, Take 500 mg by mouth 2 (two) times daily with a meal., Disp: , Rfl: ;  prasugrel (EFFIENT) 10 MG TABS, Take 10 mg by mouth daily., Disp: , Rfl:  simvastatin (ZOCOR) 80 MG tablet, Take 40 mg by mouth every evening., Disp: , Rfl: ;  DISCONTD: furosemide (LASIX) 20 MG tablet, Take 1 tablet (20 mg total) by mouth daily., Disp: 30 tablet, Rfl: 11;  DISCONTD: isosorbide mononitrate (IMDUR) 30 MG 24 hr tablet, Take 1 tablet (30 mg total) by mouth daily., Disp: 30 tablet, Rfl: 11 DISCONTD: simvastatin (ZOCOR) 80 MG tablet, Take 1 tablet (80 mg total) by mouth every evening., Disp: 30 tablet, Rfl: 11;  nitroGLYCERIN (NITROSTAT) 0.4 MG SL tablet, Place 0.4 mg under the tongue every 5 (five) minutes as needed. For chest pain, Disp: , Rfl: ;  DISCONTD: nitroGLYCERIN (NITROSTAT) 0.4 MG SL tablet, Place 1 tablet (0.4 mg total) under the tongue every 5 (five) minutes as needed for chest pain., Disp: 25 tablet, Rfl: 2  ROS: History obtained from the patient  General ROS: negative for - chills, fatigue, fever, night sweats, weight gain or weight loss Psychological ROS: negative for - behavioral disorder, hallucinations, memory difficulties, mood  swings or suicidal ideation Ophthalmic ROS: negative for - blurry vision, double vision, eye pain or loss of vision ENT ROS: negative for - epistaxis, nasal discharge, oral lesions, sore throat, tinnitus or vertigo Allergy and Immunology ROS: negative for - hives or itchy/watery eyes Hematological and Lymphatic ROS: negative for - bleeding problems, bruising or swollen lymph nodes Endocrine ROS: negative for - galactorrhea, hair pattern changes, polydipsia/polyuria or temperature intolerance Respiratory ROS: negative for - cough, hemoptysis, shortness of breath or wheezing Cardiovascular ROS:  negative for - chest pain, dyspnea on exertion, edema or irregular heartbeat Gastrointestinal ROS: negative for - abdominal pain, diarrhea, hematemesis, nausea/vomiting or stool incontinence Genito-Urinary ROS: negative for - dysuria, hematuria, incontinence or urinary frequency/urgency Musculoskeletal ROS: negative for - joint swelling or muscular weakness Neurological ROS: as noted in HPI Dermatological ROS: negative for rash and skin lesion changes  Physical Examination: Blood pressure 220/147, pulse 95, temperature 98.7 F (37.1 C), temperature source Oral, resp. rate 16, SpO2 98.00%.  Neurologic Examination: Mental Status: Alert, oriented, thought content appropriate.  Speech fluent without evidence of aphasia.  Able to follow 3 step commands without difficulty. Cranial Nerves: II: Discs flat bilaterally; Visual fields grossly normal, pupils equal, round, reactive to light and accommodation III,IV, VI: ptosis not present, extra-ocular motions intact bilaterally V,VII: smile symmetric, facial light touch sensation normal bilaterally VIII: hearing normal bilaterally IX,X: gag reflex present XI: bilateral shoulder shrug XII: tongue strength normal  Motor: Right : Upper extremity   5-/5 w/RUE drift and 5-/5 hand grip   Left:     Upper extremity   5/5  Lower extremity   5-/5         Lower extremity   5/5 Tone and bulk:normal tone throughout; no atrophy noted Sensory: Pinprick and light touch intact throughout, bilaterally Deep Tendon Reflexes: 1+ in the upper extremities and absent in the lower extremities Plantars: Right: upgoing   Left: downgoing Cerebellar: normal finger-to-nose and normal heel-to-shin test on the left, dysmetric on the right  Laboratory Studies:  Basic Metabolic Panel:  Lab [id number] 04/28/12 1320  NA 141 142  K 3.6 3.6  CL 104 104  CO2 25 25  GLUCOSE 173* 180*  BUN 19 20  CREATININE 1.22 1.37*  CALCIUM 10.5 10.7*  MG -- --  PHOS -- --     Liver Function Tests:  Lab 04/28/12 1540  AST 12  ALT 12  ALKPHOS 66  BILITOT 0.4  PROT 8.3  ALBUMIN 4.4   No results found for this basename: LIPASE:5,AMYLASE:5 in the last 168 hours No results found for this basename: AMMONIA:3 in the last 168 hours  CBC:  Lab 04/28/12 1320  WBC 9.7  NEUTROABS --  HGB 15.6  HCT 44.6  MCV 78.1  PLT 184    Cardiac Enzymes: No results found for this basename: CKTOTAL:5,CKMB:5,CKMBINDEX:5,TROPONINI:5 in the last 168 hours  BNP: No components found with this basename: POCBNP:5  CBG:  Lab 04/28/12 1325  GLUCAP 180*    Microbiology: Results for orders placed during the hospital encounter of 03/07/12  URINE CULTURE     Status: Normal   Collection Time   03/07/12 12:27 PM      Component Value Range Status Comment   Specimen Description URINE, CLEAN CATCH   Final    Special Requests NONE   Final    Culture  Setup Time 03/08/2012 01:25   Final    Colony Count NO GROWTH   Final    Culture NO GROWTH  Final    Report Status 03/09/2012 FINAL   Final   MRSA PCR SCREENING     Status: Normal   Collection Time   03/07/12 10:58 PM      Component Value Range Status Comment   MRSA by PCR NEGATIVE  NEGATIVE Final     Coagulation Studies:  Basename 04/28/12 1320  LABPROT 13.8  INR 1.04    Urinalysis: No results found for this basename: COLORURINE:2,APPERANCEUR:2,LABSPEC:2,PHURINE:2,GLUCOSEU:2,HGBUR:2,BILIRUBINUR:2,KETONESUR:2,PROTEINUR:2,UROBILINOGEN:2,NITRITE:2,LEUKOCYTESUR:2 in the last 168 hours  Lipid Panel:    Component Value Date/Time   CHOL 189 04/28/2012 1540   TRIG 95 04/28/2012 1540   HDL 39* 04/28/2012 1540   CHOLHDL 4.8 04/28/2012 1540   VLDL 19 04/28/2012 1540   LDLCALC 131* 04/28/2012 1540    HgbA1C:  Lab Results  Component Value Date   HGBA1C 17.5* 03/08/2012    Urine Drug Screen:   No results found for this basename: labopia, cocainscrnur, labbenz, amphetmu, thcu, labbarb    Alcohol Level: No results found  for this basename: ETH:2 in the last 168 hours  Imaging: Mr Brain Wo Contrast  04/28/2012  *RADIOLOGY REPORT*  Clinical Data:  Dizziness beginning yesterday.  History of diabetes and hypertension.  MRI HEAD WITHOUT CONTRAST MRA HEAD WITHOUT CONTRAST  Technique:  Multiplanar, multiecho pulse sequences of the brain and surrounding structures were obtained without intravenous contrast. Angiographic images of the head were obtained using MRA technique without contrast.  Comparison:  08/09/2006 CT  MRI HEAD  Findings:  There is a subcentimeter acute infarct affecting the posterior limb internal capsule on the left ( image 15 series 4). There is no associated hemorrhage or mass lesion.  There is no hydrocephalus or extra-axial fluid.  Mild atrophy is present.  Mild chronic microvascular ischemic change is noted in the periventricular and subcortical white matter.  Tiny focus chronic hemorrhage affects the left occipital lobe.  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  There is no midline shift.  The major intracranial vascular structures are patent.  Calvarium and skull base intact.  Upper cervical region unremarkable.  No acute sinus or mastoid disease. Negative orbits.  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  Chronic changes as described.  MRA HEAD  Findings: Mild nonstenotic irregularity left cavernous carotid. Focal 50% stenosis right cavernous ICA.  Shallow atheromatous outpouching inferior cavernous segment right ICA approximately 2 x 3 mm.  50% stenosis supraclinoid ICA bilaterally.  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  No flow limiting stenosis.  Left vertebral is the sole contributor to the basilar. Right vertebral poorly visualized distally, likely ending in PICA.  There is no proximal stenosis of the anterior, middle, or posterior cerebral arteries.  There is no cerebellar  branch occlusion. There is mild irregularity of the distal MCA and PCA branches bilaterally.  No intracranial berry aneurysm  is seen.  IMPRESSION:  No flow limiting stenosis is observed.  Intracranial atherosclerotic change as described.   Original Report Authenticated By: Staci Righter, M.D.    Mr Mra Head/brain Wo Cm  04/28/2012  *RADIOLOGY REPORT*  Clinical Data:  Dizziness beginning yesterday.  History of diabetes and hypertension.  MRI HEAD WITHOUT CONTRAST MRA HEAD WITHOUT CONTRAST  Technique:  Multiplanar, multiecho pulse sequences of the brain and surrounding structures were obtained without intravenous contrast. Angiographic images of the head were obtained using MRA technique without contrast.  Comparison:  08/09/2006 CT  MRI HEAD  Findings:  There  is a subcentimeter acute infarct affecting the posterior limb internal capsule on the left ( image 15 series 4). There is no associated hemorrhage or mass lesion.  There is no hydrocephalus or extra-axial fluid.  Mild atrophy is present.  Mild chronic microvascular ischemic change is noted in the periventricular and subcortical white matter.  Tiny focus chronic hemorrhage affects the left occipital lobe.  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  There is no midline shift.  The major intracranial vascular structures are patent.  Calvarium and skull base intact.  Upper cervical region unremarkable.  No acute sinus or mastoid disease. Negative orbits.  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  Chronic changes as described.  MRA HEAD  Findings: Mild nonstenotic irregularity left cavernous carotid. Focal 50% stenosis right cavernous ICA.  Shallow atheromatous outpouching inferior cavernous segment right ICA approximately 2 x 3 mm.  50% stenosis supraclinoid ICA bilaterally.  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  No flow limiting  stenosis.  Left vertebral is the sole contributor to the basilar. Right vertebral poorly visualized distally, likely ending in PICA.  There is no proximal stenosis of the anterior, middle, or posterior cerebral arteries.  There is no cerebellar branch occlusion. There is mild irregularity of the distal MCA and PCA branches bilaterally.  No intracranial berry aneurysm  is seen.  IMPRESSION:  No flow limiting stenosis is observed.  Intracranial atherosclerotic change as described.   Original Report Authenticated By: Staci Righter, M.D.     Assessment: 66 y.o. male with a left basal ganglia infarct presenting with dizziness and right sided weakness.  Symptoms greater than 24 hours ago and therefore patient not an intervention candidate.  Patient hypertensive and with BG involvement-likely small vessel in etiology.  Patient on ASA and Effient at home.  Case discussed with cardiologist on call (Dr. Posey Pronto).  Will discontinue Effient.  Stroke work up to be initiated.  Patient also markedly hypertensive despite attempts in ED-will likely benefit from more aggressive intervention.    Stroke Risk Factors - diabetes mellitus, hypertension and CAD  Plan: 1. HgbA1c, fasting lipid panel 2. PT consult, OT consult, Speech consult 3. Echocardiogram results pending 4. Carotid doppler unremarkable.   6. Prophylactic therapy-Continue ASA at 81mg  and start Plavix 75mg  daily.  Effient to be discontinued. 7. BP control with target SBP of 160-180. 8. Telemetry monitoring 9. Frequent neuro checks   Alexis Goodell, MD [facility] [phone] 04/28/2012, 8:45 PM

## 2012-04-28 NOTE — ED Notes (Signed)
Pt brought to room from triage via wheelchair; pt undressed, in gown, on monitor, continuous pulse oximetry and blood pressure cuff; wife at bedside

## 2012-04-28 NOTE — ED Notes (Signed)
Pt. In CT scan

## 2012-04-28 NOTE — Progress Notes (Signed)
PCP:   William Hamburger, MD   Chief Complaint:  Dizziness  HPI: 66 y/o male with h/o CAD, s/p coronary stents, who is on effient and po aspirin 81 mg daily at home came to the hospital for worsening dizziness. Dizziness started yesterday, he also had weakness of left side, with weakness in left arm.He denies slurred speech, no blurry vision, no nausea, vomiting or diarrhea. No fever, dysuria, urgency or frequency if urination.  Allergies:  No Known Allergies    Past Medical History  Diagnosis Date  . Coronary artery disease   . Diabetes mellitus   . Arthritis   . Hypertension   . Hypertensive crisis 03/07/2012  . Unstable angina 03/07/2012  . DM (diabetes mellitus),poorly controlled 03/07/2012  . CAD (coronary artery disease), with CABG in 2009 after an MI 03/07/2012  . Myocardial infarction     Past Surgical History  Procedure Date  . Cardiac surgery   . Coronary artery bypass graft   . Cardiac catheterization     Prior to Admission medications   Medication Sig Start Date End Date Taking? Authorizing Provider  acetaminophen (TYLENOL) 325 MG tablet Take 650 mg by mouth every 4 (four) hours as needed. For pain/headache   Yes Historical Provider, MD  aspirin EC 81 MG tablet Take 81 mg by mouth daily.   Yes Historical Provider, MD  bd getting started take home kit MISC 1 kit by Other route once. 03/11/12  Yes Erlene Quan, PA  carvedilol (COREG) 12.5 MG tablet Take 12.5 mg by mouth 2 (two) times daily with a meal.   Yes Historical Provider, MD  furosemide (LASIX) 20 MG tablet Take 20 mg by mouth daily. 03/11/12 03/11/13 Yes Erlene Quan, PA  isosorbide mononitrate (IMDUR) 30 MG 24 hr tablet Take 30 mg by mouth daily. 03/11/12 03/11/13 Yes Luke K Kilroy, PA  lisinopril (PRINIVIL,ZESTRIL) 20 MG tablet Take 20 mg by mouth daily.   Yes Historical Provider, MD  metFORMIN (GLUCOPHAGE) 500 MG tablet Take 500 mg by mouth 2 (two) times daily with a meal.   Yes Historical Provider, MD  prasugrel  (EFFIENT) 10 MG TABS Take 10 mg by mouth daily. 03/11/12  Yes Erlene Quan, PA  simvastatin (ZOCOR) 80 MG tablet Take 40 mg by mouth every evening. 03/11/12 03/11/13 Yes Erlene Quan, PA  nitroGLYCERIN (NITROSTAT) 0.4 MG SL tablet Place 0.4 mg under the tongue every 5 (five) minutes as needed. For chest pain 03/11/12 03/11/13  Erlene Quan, PA    Social History:  reports that he quit smoking about 30 years ago. He has never used smokeless tobacco. He reports that he drinks about .6 ounces of alcohol per week. He reports that he does not use illicit drugs.  History reviewed. No pertinent family history.  Review of Systems:  HEENT: Denies headache, blurred vision, runny nose, sore throat,  Neck: Denies thyroid problems,lymphadenopathy Chest : Denies shortness of breath, no history of COPD Heart : Denies Chest pain,  Has h/o  coronary arterey disease GI: Denies  nausea, vomiting, diarrhea, GU: Denies dysuria, urgency, frequency of urination, hematuria Neuro: Denies stroke, seizures, syncope Psych: Denies depression, anxiety, hallucinations   Physical Exam: Blood pressure 179/114, pulse 91, resp. rate 9, SpO2 100.00%. Constitutional:   Patient is a well-developed and well-nourished  Male  in no acute distress and cooperative with exam. Head: Normocephalic and atraumatic Mouth: Mucus membranes moist Eyes: PERRL, EOMI, conjunctivae normal Neck: Supple, No Thyromegaly Cardiovascular: RRR, S1 normal, S2 normal  Pulmonary/Chest: CTAB, no wheezes, rales, or rhonchi Abdominal: Soft. Non-tender, non-distended, bowel sounds are normal, no masses, organomegaly, or guarding present.  Neurological: A&O x3, Strenght 4/5 in right upper limb nd symmetric bilaterally, cranial nerve II-XII are grossly intact, no focal motor deficit, sensory intact to light touch bilaterally. Finger to nose abnormal on right Extremities : No Cyanosis, Clubbing or Edema   Labs on Admission:  Results for orders placed during the  hospital encounter of 04/28/12 (from the past 48 hour(s))  CBC     Status: Normal   Collection Time   04/28/12  1:20 PM      Component Value Range Comment   WBC 9.7  4.0 - 10.5 K/uL    RBC 5.71  4.22 - 5.81 MIL/uL    Hemoglobin 15.6  13.0 - 17.0 g/dL    HCT 44.6  39.0 - 52.0 %    MCV 78.1  78.0 - 100.0 fL    MCH 27.3  26.0 - 34.0 pg    MCHC 35.0  30.0 - 36.0 g/dL    RDW 13.9  11.5 - 15.5 %    Platelets 184  150 - 400 K/uL   BASIC METABOLIC PANEL     Status: Abnormal   Collection Time   04/28/12  1:20 PM      Component Value Range Comment   Sodium 142  135 - 145 mEq/L    Potassium 3.6  3.5 - 5.1 mEq/L    Chloride 104  96 - 112 mEq/L    CO2 25  19 - 32 mEq/L    Glucose, Bld 180 (*) 70 - 99 mg/dL    BUN 20  6 - 23 mg/dL    Creatinine, Ser 1.37 (*) 0.50 - 1.35 mg/dL    Calcium 10.7 (*) 8.4 - 10.5 mg/dL    GFR calc non Af Amer 53 (*) >90 mL/min    GFR calc Af Amer 62 (*) >90 mL/min   PROTIME-INR     Status: Normal   Collection Time   04/28/12  1:20 PM      Component Value Range Comment   Prothrombin Time 13.8  11.6 - 15.2 seconds    INR 1.04  0.00 - 1.49   GLUCOSE, CAPILLARY     Status: Abnormal   Collection Time   04/28/12  1:25 PM      Component Value Range Comment   Glucose-Capillary 180 (*) 70 - 99 mg/dL    Comment 1 Documented in Chart      Comment 2 Notify RN     COMPREHENSIVE METABOLIC PANEL     Status: Abnormal   Collection Time   04/28/12  3:40 PM      Component Value Range Comment   Sodium 141  135 - 145 mEq/L    Potassium 3.6  3.5 - 5.1 mEq/L    Chloride 104  96 - 112 mEq/L    CO2 25  19 - 32 mEq/L    Glucose, Bld 173 (*) 70 - 99 mg/dL    BUN 19  6 - 23 mg/dL    Creatinine, Ser 1.22  0.50 - 1.35 mg/dL    Calcium 10.5  8.4 - 10.5 mg/dL    Total Protein 8.3  6.0 - 8.3 g/dL    Albumin 4.4  3.5 - 5.2 g/dL    AST 12  0 - 37 U/L    ALT 12  0 - 53 U/L    Alkaline Phosphatase 66  39 -  117 U/L    Total Bilirubin 0.4  0.3 - 1.2 mg/dL    GFR calc non Af Amer 61 (*)  >90 mL/min    GFR calc Af Amer 71 (*) >90 mL/min   APTT     Status: Normal   Collection Time   04/28/12  3:40 PM      Component Value Range Comment   aPTT 31  24 - 37 seconds   LIPID PANEL     Status: Abnormal   Collection Time   04/28/12  3:40 PM      Component Value Range Comment   Cholesterol 189  0 - 200 mg/dL    Triglycerides 95  <150 mg/dL    HDL 39 (*) >39 mg/dL    Total CHOL/HDL Ratio 4.8      VLDL 19  0 - 40 mg/dL    LDL Cholesterol 131 (*) 0 - 99 mg/dL     Radiological Exams on Admission: Mr Brain Wo Contrast  04/28/2012  *RADIOLOGY REPORT*  Clinical Data:  Dizziness beginning yesterday.  History of diabetes and hypertension.  MRI HEAD WITHOUT CONTRAST MRA HEAD WITHOUT CONTRAST  Technique:  Multiplanar, multiecho pulse sequences of the brain and surrounding structures were obtained without intravenous contrast. Angiographic images of the head were obtained using MRA technique without contrast.  Comparison:  08/09/2006 CT  MRI HEAD  Findings:  There is a subcentimeter acute infarct affecting the posterior limb internal capsule on the left ( image 15 series 4). There is no associated hemorrhage or mass lesion.  There is no hydrocephalus or extra-axial fluid.  Mild atrophy is present.  Mild chronic microvascular ischemic change is noted in the periventricular and subcortical white matter.  Tiny focus chronic hemorrhage affects the left occipital lobe.  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  There is no midline shift.  The major intracranial vascular structures are patent.  Calvarium and skull base intact.  Upper cervical region unremarkable.  No acute sinus or mastoid disease. Negative orbits.  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  Chronic changes as described.  MRA HEAD  Findings: Mild nonstenotic irregularity left cavernous carotid. Focal 50% stenosis right cavernous ICA.  Shallow  atheromatous outpouching inferior cavernous segment right ICA approximately 2 x 3 mm.  50% stenosis supraclinoid ICA bilaterally.  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  No flow limiting stenosis.  Left vertebral is the sole contributor to the basilar. Right vertebral poorly visualized distally, likely ending in PICA.  There is no proximal stenosis of the anterior, middle, or posterior cerebral arteries.  There is no cerebellar branch occlusion. There is mild irregularity of the distal MCA and PCA branches bilaterally.  No intracranial berry aneurysm  is seen.  IMPRESSION:  No flow limiting stenosis is observed.  Intracranial atherosclerotic change as described.   Original Report Authenticated By: Staci Righter, M.D.    Mr Mra Head/brain Wo Cm  04/28/2012  *RADIOLOGY REPORT*  Clinical Data:  Dizziness beginning yesterday.  History of diabetes and hypertension.  MRI HEAD WITHOUT CONTRAST MRA HEAD WITHOUT CONTRAST  Technique:  Multiplanar, multiecho pulse sequences of the brain and surrounding structures were obtained without intravenous contrast. Angiographic images of the head were obtained using MRA technique without contrast.  Comparison:  08/09/2006 CT  MRI HEAD  Findings:  There is a subcentimeter acute infarct affecting the posterior limb internal capsule on the left ( image 15  series 4). There is no associated hemorrhage or mass lesion.  There is no hydrocephalus or extra-axial fluid.  Mild atrophy is present.  Mild chronic microvascular ischemic change is noted in the periventricular and subcortical white matter.  Tiny focus chronic hemorrhage affects the left occipital lobe.  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  There is no midline shift.  The major intracranial vascular structures are patent.  Calvarium and skull base intact.  Upper cervical region unremarkable.  No acute sinus or mastoid disease. Negative orbits.  IMPRESSION:  Acute subcentimeter infarct affects the posterior limb internal capsule on the left.  Chronic changes as described.  MRA HEAD  Findings: Mild nonstenotic irregularity left cavernous carotid. Focal 50% stenosis right cavernous ICA.  Shallow atheromatous outpouching inferior cavernous segment right ICA approximately 2 x 3 mm.  50% stenosis supraclinoid ICA bilaterally.  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  No flow limiting stenosis.  Left vertebral is the sole contributor to the basilar. Right vertebral poorly visualized distally, likely ending in PICA.  There is no proximal stenosis of the anterior, middle, or posterior cerebral arteries.  There is no cerebellar branch occlusion. There is mild irregularity of the distal MCA and PCA branches bilaterally.  No intracranial berry aneurysm  is seen.  IMPRESSION:  No flow limiting stenosis is observed.  Intracranial atherosclerotic change as described.   Original Report Authenticated By: Staci Righter, M.D.     Assessment/Plan Active Problems:  * No active hospital problems. *   CVA CAD Hypertension Diabetes Mellitus  CVA Will admit the patient and initaite stroke protocol, will continue on full dose aspirin, and hold the Effient at this time, till cleared by neurology. Will keep him NPO till [place].  CAD Continue Coreg, isosorbide, aspirin for now. Effient on hold   Diabetes Mellitus Sliding scale Insulin  Hypertension Allow permissive hypertension Will continue on Coreg 12.5 mg po BID  DVT Prophylaxis SCD's.  Time Spent on Admission:   [facility] Pager: [phone] 04/28/2012, 7:24 PM

## 2012-04-28 NOTE — H&P (Signed)
Name: [name] MRN: [id number] DOB: 01-Aug-1946    LOS: 0  Referring Provider:  EDP Reason for Referral:  Hypertensive emergency   PULMONARY / CRITICAL CARE MEDICINE  HPI:  66 yo with CAD (recent stent - 6/28 on ASA and Effient), poorly controlled hypertension and diabetes who presented to [facility] ED on 8/19 with dizziness and right sided weakness and numbness for about 24 hours.  Found to by hypertensive.  Head CT demonstrated acute left basal ganglia infarct.  Past Medical History  Diagnosis Date  . Coronary artery disease   . Diabetes mellitus   . Arthritis   . Hypertension   . Hypertensive crisis 03/07/2012  . Unstable angina 03/07/2012  . DM (diabetes mellitus),poorly controlled 03/07/2012  . CAD (coronary artery disease), with CABG in 2009 after an MI 03/07/2012  . Myocardial infarction    Past Surgical History  Procedure Date  . Cardiac surgery   . Coronary artery bypass graft   . Cardiac catheterization    Prior to Admission medications   Medication Sig Start Date End Date Taking? Authorizing Provider  acetaminophen (TYLENOL) 325 MG tablet Take 650 mg by mouth every 4 (four) hours as needed. For pain/headache   Yes Historical Provider, MD  aspirin EC 81 MG tablet Take 81 mg by mouth daily.   Yes Historical Provider, MD  bd getting started take home kit MISC 1 kit by Other route once. 03/11/12  Yes Erlene Quan, PA  carvedilol (COREG) 12.5 MG tablet Take 12.5 mg by mouth 2 (two) times daily with a meal.   Yes Historical Provider, MD  furosemide (LASIX) 20 MG tablet Take 20 mg by mouth daily. 03/11/12 03/11/13 Yes Erlene Quan, PA  isosorbide mononitrate (IMDUR) 30 MG 24 hr tablet Take 30 mg by mouth daily. 03/11/12 03/11/13 Yes Luke K Kilroy, PA  lisinopril (PRINIVIL,ZESTRIL) 20 MG tablet Take 20 mg by mouth daily.   Yes Historical Provider, MD  metFORMIN (GLUCOPHAGE) 500 MG tablet Take 500 mg by mouth 2 (two) times daily with a meal.   Yes Historical Provider, MD  prasugrel  (EFFIENT) 10 MG TABS Take 10 mg by mouth daily. 03/11/12  Yes Erlene Quan, PA  simvastatin (ZOCOR) 80 MG tablet Take 40 mg by mouth every evening. 03/11/12 03/11/13 Yes Erlene Quan, PA  nitroGLYCERIN (NITROSTAT) 0.4 MG SL tablet Place 0.4 mg under the tongue every 5 (five) minutes as needed. For chest pain 03/11/12 03/11/13  Erlene Quan, PA   Allergies No Known Allergies  Family History History reviewed. No pertinent family history.  Social History  reports that he quit smoking about 30 years ago. He has never used smokeless tobacco. He reports that he drinks about .6 ounces of alcohol per week. He reports that he does not use illicit drugs.  Review Of Systems: General ROS: negative for - chills, fatigue, fever, night sweats, weight gain or weight loss  Psychological ROS: negative for - behavioral disorder, hallucinations, memory difficulties, mood swings or suicidal ideation  Ophthalmic ROS: negative for - blurry vision, double vision, eye pain or loss of vision  ENT ROS: negative for - epistaxis, nasal discharge, oral lesions, sore throat, tinnitus or vertigo  Allergy and Immunology ROS: negative for - hives or itchy/watery eyes  Hematological and Lymphatic ROS: negative for - bleeding problems, bruising or swollen lymph nodes  Endocrine ROS: negative for - galactorrhea, hair pattern changes, polydipsia/polyuria or temperature intolerance  Respiratory ROS: negative for - cough, hemoptysis, shortness  of breath or wheezing  Cardiovascular ROS: negative for - chest pain, dyspnea on exertion, edema or irregular heartbeat  Gastrointestinal ROS: negative for - abdominal pain, diarrhea, hematemesis, nausea/vomiting or stool incontinence  Genito-Urinary ROS: negative for - dysuria, hematuria, incontinence or urinary frequency/urgency  Musculoskeletal ROS: negative for - joint swelling or muscular weakness  Neurological ROS: as noted in HPI  Dermatological ROS: negative for rash and skin lesion  changes  Brief patient description:  66 yo with CAD (recent stent - 6/28 on ASA and Effient), poorly controlled hypertension and diabetes who presented to [facility] ED on 8/19 with dizziness and right sided weakness and numbness for about 24 hours.  Found to by hypertensive.  Head CT demonstrated acute left basal ganglia infarct.  Events Since Admission: 8/19  Presented to [facility] ED with dizziness and right sided weakness and numbness for about 24 hours.  Found to by hypertensive.  Head CT demonstrated acute left basal ganglia infarct.  Vital Signs: Temp:  [98.4 F (36.9 C)-98.7 F (37.1 C)] 98.4 F (36.9 C) (08/19 2159) Pulse Rate:  [80-103] 80  (08/19 2159) Resp:  [9-20] 16  (08/19 2159) BP: (162-198)/(110-126) 168/110 mmHg (08/19 2159) SpO2:  [98 %-100 %] 100 % (08/19 2159)  Physical Examination: General:  No acute distress Neuro:  Awake, alert, cooperative, nonfocal HEENT:  PERRL Neck:  No JVD Cardiovascular:  RRR, no m/r/g Lungs:  Bilateral air entry, no w/r/r Abdomen:  Soft, nontender, nondistended, bowel sounds present Musculoskeletal:  Moves all extremities, equal strength Skin:  No rash  Active Problems:  DM (diabetes mellitus),poorly controlled  CAD (coronary artery disease), with CABG in 2009 after an MI  Dyslipidemia, (HDL 25)  Acute ischemic stroke  Hypertensive emergency  Hyperglycemia  ASSESSMENT AND PLAN  PULMONARY No results found for this basename: PHART:5,PCO2:5,PCO2ART:5,PO2ART:5,HCO3:5,O2SAT:5 in the last 168 hours  Ventilator Settings:    CXR:  NA ETT:  NA  A:  No active issues. P:   No interventions required  CARDIOVASCULAR No results found for this basename: TROPONINI:5,LATICACIDVEN:5, O2SATVEN:5,PROBNP:5 in the last 168 hours  ECG:  8/19 >>> NSR, nonspecific ST-T changes Lines: NA  A: Hypertensive emergency.  H/o HTN, CAD (stent 6/28).  Dyslipidemia. P:  Goal SBP < 180 and DBP < 110 Continue ASA, Coreg, Imdur, Zocor Hold Lisinopril  (permissive hypertension) D/c Effient (per Neurology - contraindicated) and start Plavix (discussed with Dr. Posey Pronto - Cardiology) Labetalol PRN  RENAL  Lab 04/28/12 1540 04/28/12 1320  NA 141 142  K 3.6 3.6  CL 104 104  CO2 25 25  BUN 19 20  CREATININE 1.22 1.37*  CALCIUM 10.5 10.7*  MG -- --  PHOS -- --   Intake/Output    None    Foley:  NA  A:  Chronic renal insufficiency, likely secondary to hypertension. P:   INF NS 100 mL/h Hold Lasix  GASTROINTESTINAL  Lab 04/28/12 1540  AST 12  ALT 12  ALKPHOS 66  BILITOT 0.4  PROT 8.3  ALBUMIN 4.4    A:  No active issues. P:   NPO awaiting SLP evaluation.  HEMATOLOGIC  Lab 04/28/12 1540 04/28/12 1320  HGB -- 15.6  HCT -- 44.6  PLT -- 184  INR -- 1.04  APTT 31 --   A:  No active issues. P:  Trend CBC  INFECTIOUS  Lab 04/28/12 1320  WBC 9.7  PROCALCITON --   Cultures: NA  Antibiotics: NA  A:  No active issues. P:   No interventions required  ENDOCRINE  Lab 04/28/12 1325  GLUCAP 180*   A:  DM.  Hyperglycemia. P:   Goal CBG 120 to 180 SSI / CBG Hold Metformin  NEUROLOGIC  Head CT:  8/19 >>> nad Brain MRI / MRA:  8/19 >>> Acute subcentimeter infarct affects the posterior limb internal capsule on the left.  No flow limiting stenosis is observed. Intracranial atherosclerotic change.  A:  Acute basal ganglia infarct.   P:   Per Neurology  BEST PRACTICE / DISPOSITION Level of Care:  ICU Primary Service:  PCCM Consultants:  Neurology, Cardiology? Code Status:  Full Diet:  NPO DVT Px:  Not indicated GI Px:  SCDs Skin Integrity:  Intact Social / Family:  Updated at bedside  The patient is critically ill with multiple organ systems failure and requires high complexity decision making for assessment and support, frequent evaluation and titration of therapies, application of advanced monitoring technologies and extensive interpretation of multiple databases. Critical Care Time devoted to  patient care services described in this note is 35 minutes.   Doree Fudge, M.D. Pulmonary and [place] Pager: [phone]  04/28/2012, 10:23 PM

## 2012-04-29 ENCOUNTER — Encounter (HOSPITAL_COMMUNITY): Payer: Self-pay

## 2012-04-29 LAB — GLUCOSE, CAPILLARY
Glucose-Capillary: 120 mg/dL — ABNORMAL HIGH (ref 70–99)
Glucose-Capillary: 144 mg/dL — ABNORMAL HIGH (ref 70–99)
Glucose-Capillary: 160 mg/dL — ABNORMAL HIGH (ref 70–99)
Glucose-Capillary: 248 mg/dL — ABNORMAL HIGH (ref 70–99)

## 2012-04-29 LAB — HEMOGLOBIN A1C
Hgb A1c MFr Bld: 8.8 % — ABNORMAL HIGH (ref <5.7)
Hgb A1c MFr Bld: 8.7 % — ABNORMAL HIGH (ref <5.7)

## 2012-04-29 LAB — LIPID PANEL
LDL Cholesterol: 130 mg/dL — ABNORMAL HIGH (ref 0–99)
Triglycerides: 101 mg/dL (ref <150)
VLDL: 20 mg/dL (ref 0–40)

## 2012-04-29 MED ORDER — AMLODIPINE BESYLATE 5 MG PO TABS
5.0000 mg | ORAL_TABLET | Freq: Every day | ORAL | Status: DC
  Administered 2012-04-29 – 2012-05-01 (×3): 5 mg via ORAL
  Filled 2012-04-29 (×3): qty 1

## 2012-04-29 NOTE — Evaluation (Signed)
Physical Therapy Evaluation Patient Details Name: [name] MRN: [id number] DOB: 25-Jul-1946 Today's Date: 04/29/2012 Time: 1250-1309 PT Time Calculation (min): 19 min  PT Assessment / Plan / Recommendation Clinical Impression  pt admitted with dizziness and R sided weakness.  On eval, mobility limited by decr balance,  R side weakness and incoordination.  Pt can benefit from CIR.    PT Assessment  Patient needs continued PT services    Follow Up Recommendations  Inpatient Rehab    Barriers to Discharge        Equipment Recommendations  Defer to next venue    Recommendations for Other Services Rehab consult   Frequency Min 4X/week    Precautions / Restrictions Precautions Precautions: Fall   Pertinent Vitals/Pain       Mobility  Bed Mobility Bed Mobility: Supine to Sit;Sitting - Scoot to Edge of Bed Supine to Sit: 5: Supervision Sitting - Scoot to Edge of Bed: 5: Supervision Details for Bed Mobility Assistance: pt unable to utilize his R UE in a coordinated manner.  VC's for improved technique Transfers Transfers: Sit to Stand;Stand to Sit Sit to Stand: 4: Min assist Stand to Sit: 4: Min assist Details for Transfer Assistance: vc's for safe hand placement; stability assist Ambulation/Gait Ambulation/Gait Assistance: 3: Mod assist Ambulation Distance (Feet): 45 Feet Assistive device: 1 person hand held assist;Other (Comment) (iv pole) Ambulation/Gait Assistance Details: ataxic gait R LE, generally dragging behind or having trouble swinging through.  Heavy list R which worsened with fatigue. Gait Pattern: Step-through pattern;Decreased step length - right;Decreased step length - left;Decreased stride length;Decreased weight shift to left;Shuffle;Ataxic Stairs: No Wheelchair Mobility Wheelchair Mobility: No Modified Rankin (Stroke Patients Only) Pre-Morbid Rankin Score: Moderately severe disability    Exercises     PT Diagnosis: Difficulty walking;Acute  pain;Hemiplegia dominant side  PT Problem List: Decreased strength;Decreased activity tolerance;Decreased balance;Decreased mobility;Decreased coordination;Decreased knowledge of precautions;Decreased safety awareness PT Treatment Interventions: Gait training;Stair training;Functional mobility training;Therapeutic activities;Balance training;Patient/family education   PT Goals Acute Rehab PT Goals PT Goal Formulation: With patient/family Time For Goal Achievement: 05/13/12 Potential to Achieve Goals: Good Pt will go Supine/Side to Sit: with supervision PT Goal: Supine/Side to Sit - Progress: Goal set today Pt will go Sit to Supine/Side: with supervision PT Goal: Sit to Supine/Side - Progress: Goal set today Pt will go Stand to Sit: with supervision PT Goal: Stand to Sit - Progress: Goal set today Pt will Transfer Bed to Chair/Chair to Bed: with supervision PT Transfer Goal: Bed to Chair/Chair to Bed - Progress: Goal set today Pt will Ambulate: 16 - 50 feet;with supervision;with least restrictive assistive device PT Goal: Ambulate - Progress: Goal set today Pt will Go Up / Down Stairs: 1-2 stairs;with rail(s);with min assist PT Goal: Up/Down Stairs - Progress: Goal set today  Visit Information  Last PT Received On: 04/29/12 Assistance Needed: +2 (for safety)    Subjective Data  Subjective: I'm feeling better, but I haven't gotten up. Patient Stated Goal: Back to independent   Prior [place] Lives With: Spouse Available Help at Discharge: Family Type of Home: Apartment Home Access: Stairs to enter Technical brewer of Steps: several Entrance Stairs-Rails: Right;Left Home Layout: One level Bathroom Shower/Tub: Chiropodist: Standard Home Adaptive Equipment: Grab bars in shower Prior Function Level of Independence: Independent Able to Take Stairs?: Yes Driving: Yes Vocation: Retired Corporate investment banker: No  difficulties Dominant Hand: Right    Cognition  Overall Cognitive Status: Appears within functional limits for  tasks assessed/performed Arousal/Alertness: Awake/alert Orientation Level: Appears intact for tasks assessed Behavior During Session: [facility] for tasks performed    Extremity/Trunk Assessment Right Upper Extremity Assessment RUE ROM/Strength/Tone: Deficits RUE ROM/Strength/Tone Deficits: weakness and incoordination; grossly 3+5 Left Upper Extremity Assessment LUE ROM/Strength/Tone: Within functional levels Right Lower Extremity Assessment RLE ROM/Strength/Tone: Deficits RLE ROM/Strength/Tone Deficits: grossly weak at 3-to 3/5, brunstrom 3, and incooridinated and moving in synergy Left Lower Extremity Assessment LLE ROM/Strength/Tone: Within functional levels Trunk Assessment Trunk Assessment: Normal   Balance Balance Balance Assessed: Yes Dynamic Sitting Balance Dynamic Sitting - Balance Support: Bilateral upper extremity supported;Feet supported;During functional activity Dynamic Sitting - Level of Assistance: 5: Stand by assistance;4: Min assist Dynamic Sitting Balance - Compensations: while putting on socks at EOB, pt began listing posteriorly and to the R, but could reover once he stopped trying to put on his socks Dynamic Sitting - Balance Activities: Other (comment) (putting on his socks)  End of Session PT - End of Session Activity Tolerance: Patient tolerated treatment well;Patient limited by fatigue Patient left: in chair;with call bell/phone within reach;with family/visitor present Nurse Communication: Mobility status  GP     Obera Stauch, Tessie Fass 04/29/2012, 2:40 PM  04/29/2012  Donnella Sham, [place] [phone] (pager)

## 2012-04-29 NOTE — Progress Notes (Signed)
Name: [name] MRN: [id number] DOB: September 21, 1945    LOS: 1  Referring Provider:  EDP Reason for Referral:  Hypertensive emergency   PULMONARY / CRITICAL CARE MEDICINE  HPI:  66 yo with CAD (recent stent - 6/28 on ASA and Effient), poorly controlled hypertension and diabetes who presented to [facility] ED on 8/19 with dizziness and right sided weakness and numbness for about 24 hours.  Found to by hypertensive.  Head CT demonstrated acute left basal ganglia infarct.  Brief patient description:  66 yo with CAD Found to by hypertensive.  Head CT demonstrated acute left basal ganglia infarct.  Events Since Admission:   Current status: Pt alert and awake, no focal deficits,  BP is better  Vital Signs: Temp:  [97.9 F (36.6 C)-98.7 F (37.1 C)] 97.9 F (36.6 C) (08/20 0815) Pulse Rate:  [68-103] 77  (08/20 0815) Resp:  [6-20] 6  (08/20 0815) BP: (133-198)/(87-126) 167/104 mmHg (08/20 0815) SpO2:  [98 %-100 %] 100 % (08/20 0815) Weight:  [87.8 kg (193 lb 9 oz)] 87.8 kg (193 lb 9 oz) (08/19 2300)  Physical Examination: General:  No acute distress Neuro:  Awake, alert, cooperative, nonfocal HEENT:  PERRL Neck:  No JVD Cardiovascular:  RRR, no m/r/g Lungs:  Bilateral air entry, no w/r/r Abdomen:  Soft, nontender, nondistended, bowel sounds present Musculoskeletal:  Moves all extremities, equal strength Skin:  No rash  Active Problems:  DM (diabetes mellitus),poorly controlled  CAD (coronary artery disease), with CABG in 2009 after an MI  Dyslipidemia, (HDL 25)  Acute ischemic stroke  Hypertensive emergency  Hyperglycemia  ASSESSMENT AND PLAN  PULMONARY No results found for this basename: PHART:5,PCO2:5,PCO2ART:5,PO2ART:5,HCO3:5,O2SAT:5 in the last 168 hours  Ventilator Settings:    CXR:  NA ETT:  NA  A:  No active issues. P:   No interventions required  CARDIOVASCULAR No results found for this basename: TROPONINI:5,LATICACIDVEN:5, O2SATVEN:5,PROBNP:5 in the last  168 hours  ECG:  8/19 >>> NSR, nonspecific ST-T changes Lines: NA  A: Hypertensive emergency.  H/o HTN, CAD (stent 6/28).  Dyslipidemia. P:  Goal SBP < 180 and DBP < 110 Continue ASA, Coreg, Imdur, Zocor Cont   Plavix (discussed with Dr. Posey Pronto - Cardiology) Labetalol PRN  RENAL  Lab 04/28/12 1540 04/28/12 1320  NA 141 142  K 3.6 3.6  CL 104 104  CO2 25 25  BUN 19 20  CREATININE 1.22 1.37*  CALCIUM 10.5 10.7*  MG -- --  PHOS -- --   Intake/Output      08/19 0701 - 08/20 0700 08/20 0701 - 08/21 0700   I.V. (mL/kg) 600 (6.8)    Total Intake(mL/kg) 600 (6.8)    Urine (mL/kg/hr) 350 (0.2)    Total Output 350    Net +250          Foley:  NA  A:  Chronic renal insufficiency, likely secondary to hypertension. P:   Reduce IVF to [facility] Hold Lasix  GASTROINTESTINAL  Lab 04/28/12 1540  AST 12  ALT 12  ALKPHOS 66  BILITOT 0.4  PROT 8.3  ALBUMIN 4.4    A:  No active issues. P:   NPO awaiting SLP evaluation.  HEMATOLOGIC  Lab 04/28/12 1540 04/28/12 1320  HGB -- 15.6  HCT -- 44.6  PLT -- 184  INR -- 1.04  APTT 31 --   A:  No active issues. P:  Trend CBC  INFECTIOUS  Lab 04/28/12 1320  WBC 9.7  PROCALCITON --   Cultures: NA  Antibiotics: NA  A:  No active issues. P:   No interventions required  ENDOCRINE  Lab 04/29/12 0837 04/29/12 0337 04/28/12 1325  GLUCAP 144* 120* 180*   A:  DM.  Hyperglycemia. P:   Goal CBG 120 to 180 SSI / CBG Hold Metformin  NEUROLOGIC  Head CT:  8/19 >>> nad Brain MRI / MRA:  8/19 >>> Acute subcentimeter infarct affects the posterior limb internal capsule on the left.  No flow limiting stenosis is observed. Intracranial atherosclerotic change.  A:  Acute basal ganglia infarct.   P:   Per Neurology  BEST PRACTICE / DISPOSITION Level of Care:  ICU Primary Service:  PCCM Consultants:  Neurology, Cardiology Code Status:  Full Diet:  NPO DVT Px:  Not indicated GI Px:  SCDs Skin Integrity:  Intact Social  / Family:  Updated at bedside  The patient is critically ill with multiple organ systems failure and requires high complexity decision making for assessment and support, frequent evaluation and titration of therapies, application of advanced monitoring technologies and extensive interpretation of multiple databases. Critical Care Time devoted to patient care services described in this note is 30 minutes.   [name], M.D. Beeper  [phone]  Cell  [phone]  If no response or cell goes to voicemail, call beeper [phone]  04/29/2012, 9:46 AM

## 2012-04-29 NOTE — Evaluation (Signed)
Speech Language Pathology Evaluation Patient Details Name: [name] MRN: [id number] DOB: [birth year] Today's Date: 04/29/2012 Time: [id number] SLP Time Calculation (min): 11 min  Problem List:  Patient Active Problem List  Diagnosis  . DM (diabetes mellitus),poorly controlled  . CAD (coronary artery disease), with CABG in 2009 after an MI  . NSTEMI - Occluded SVG-OM, s/p BMS 03/08/12  . Dyslipidemia, (HDL 25)  . Acute ischemic stroke  . Hypertensive emergency  . Hyperglycemia   Past Medical History:  Past Medical History  Diagnosis Date  . Coronary artery disease   . Diabetes mellitus   . Arthritis   . Hypertension   . Hypertensive crisis 03/07/2012  . Unstable angina 03/07/2012  . DM (diabetes mellitus),poorly controlled 03/07/2012  . CAD (coronary artery disease), with CABG in 2009 after an MI 03/07/2012  . Myocardial infarction    Past Surgical History:  Past Surgical History  Procedure Date  . Cardiac surgery   . Coronary artery bypass graft   . Cardiac catheterization    HPI:  66 yr old admitted with hypertensive episode.  CT revealed a left basal ganglia infarct.    Assessment / Plan / Recommendation Clinical Impression  Pt. exhibited no deficits with communication, cognition or speech intelligibility from assessment this am.  Pt. will not need ST services at this time on acute care venue.    SLP Assessment  Patient does not need any further Speech Lanaguage Pathology Services    Follow Up Recommendations  None    Frequency and Duration              SLP Evaluation Prior Functioning  Cognitive/Linguistic Baseline: Within functional limits Type of Home: Apartment Lives With: Spouse Vocation: Retired (retired school custodian)   Cognition  Overall Cognitive Status: Appears within functional limits for tasks assessed Orientation Level: Oriented X4 Safety/Judgment: Appears intact    Comprehension  Auditory Comprehension Overall Auditory  Comprehension: Appears within functional limits for tasks assessed Visual Recognition/Discrimination Discrimination: Not tested Reading Comprehension Reading Status: Not tested    Expression Expression Primary Mode of Expression: Verbal Verbal Expression Overall Verbal Expression: Appears within functional limits for tasks assessed Written Expression Dominant Hand: Right Written Expression: Within Functional Limits (decr legibility)   Oral / Motor Oral Motor/Sensory Function Overall Oral Motor/Sensory Function: Appears within functional limits for tasks assessed Motor Speech Overall Motor Speech: Appears within functional limits for tasks assessed Intelligibility: Intelligible Motor Planning: Witnin functional limits   [employer] [phone]  04/29/2012

## 2012-04-29 NOTE — Progress Notes (Signed)
Labetolol given

## 2012-04-29 NOTE — Progress Notes (Signed)
Stroke Team Progress Note  HISTORY  [name] is an 66 y.o. male who reports that at about 430PM on yesterday had acute onset of dizziness on 04/28/12.Marland Kitchen When attempted to get up felt as if his right side was not keeping up with his left side. Symptoms worsened today and patient presented for evaluation. Dizziness improved somewhat while in ED. MR reveals  a small left basal ganglia infarct. Patient had a cardiac stent placement on 6/28. Has been on Effient and Baby ASA since that time. Dr. Ellyn Hack.  SUBJECTIVE His therapist is at the bedside.  Overall he feels his condition is rapidly improving.  OBJECTIVE Most recent Vital Signs: Filed Vitals:   04/29/12 0600 04/29/12 0700 04/29/12 0736 04/29/12 0745  BP: 148/103 164/110 187/101 168/100  Pulse: 74 73 87 78  Temp:      TempSrc:      Resp: 17 12  14   Height:      Weight:      SpO2: 100% 100%  100%   CBG (last 3)   Basename 04/29/12 0337 04/28/12 1325  GLUCAP 120* 180*   Intake/Output from previous day: 08/19 0701 - 08/20 0700 In: 600 [I.V.:600] Out: 350 [Urine:350]  IV Fluid Intake:     . sodium chloride 100 mL/hr (04/29/12 0040)    MEDICATIONS    . aspirin  81 mg Oral Daily  . atorvastatin  40 mg Oral q1800  . carvedilol  12.5 mg Oral BID WC  . clopidogrel  75 mg Oral Q breakfast  . insulin aspart  0-15 Units Subcutaneous Q4H  . isosorbide mononitrate  30 mg Oral Daily  . DISCONTD: aspirin EC  81 mg Oral Daily  . DISCONTD: atorvastatin  40 mg Oral q1800  . DISCONTD: furosemide  20 mg Oral Daily  . DISCONTD: furosemide  20 mg Oral Daily  . DISCONTD: isosorbide mononitrate  30 mg Oral Daily  . DISCONTD: lisinopril  20 mg Oral Daily  . DISCONTD: metFORMIN  500 mg Oral BID WC  . DISCONTD: prasugrel  10 mg Oral Daily   PRN:  labetalol, nitroGLYCERIN, senna-docusate, DISCONTD: acetaminophen, DISCONTD: labetalol  Diet:  Cardiac Activity:  Ambulate DVT Prophylaxis:  SCD  CLINICALLY SIGNIFICANT STUDIES Basic  Metabolic Panel:  Lab [id number] 04/28/12 1320  NA 141 142  K 3.6 3.6  CL 104 104  CO2 25 25  GLUCOSE 173* 180*  BUN 19 20  CREATININE 1.22 1.37*  CALCIUM 10.5 10.7*  MG -- --  PHOS -- --   Liver Function Tests:  Lab 04/28/12 1540  AST 12  ALT 12  ALKPHOS 66  BILITOT 0.4  PROT 8.3  ALBUMIN 4.4   CBC:  Lab 04/28/12 1320  WBC 9.7  NEUTROABS --  HGB 15.6  HCT 44.6  MCV 78.1  PLT 184   Coagulation:  Lab 04/28/12 1320  LABPROT 13.8  INR 1.04   Cardiac Enzymes: No results found for this basename: CKTOTAL:3,CKMB:3,CKMBINDEX:3,TROPONINI:3 in the last 168 hours Urinalysis: No results found for this basename: COLORURINE:2,APPERANCEUR:2,LABSPEC:2,PHURINE:2,GLUCOSEU:2,HGBUR:2,BILIRUBINUR:2,KETONESUR:2,PROTEINUR:2,UROBILINOGEN:2,NITRITE:2,LEUKOCYTESUR:2 in the last 168 hours Lipid Panel    Component Value Date/Time   CHOL 187 04/29/2012 0402   TRIG 101 04/29/2012 0402   HDL 37* 04/29/2012 0402   CHOLHDL 5.1 04/29/2012 0402   VLDL 20 04/29/2012 0402   LDLCALC 130* 04/29/2012 0402   HgbA1C  Lab Results  Component Value Date   HGBA1C 8.8* 04/28/2012    Urine Drug Screen:   No results found for this basename: labopia,  cocainscrnur, labbenz, amphetmu, thcu, labbarb    Alcohol Level: No results found for this basename: ETH:2 in the last 168 hours  Mr Brain Wo Contrast  04/28/2012  *RADIOLOGY REPORT*  Clinical Data:  Dizziness beginning yesterday.  History of diabetes and hypertension.  MRI HEAD WITHOUT CONTRAST MRA HEAD WITHOUT CONTRAST  Technique:  Multiplanar, multiecho pulse sequences of the brain and surrounding structures were obtained without intravenous contrast. Angiographic images of the head were obtained using MRA technique without contrast.  Comparison:  08/09/2006 CT  MRI HEAD  Findings:  There is a subcentimeter acute infarct affecting the posterior limb internal capsule on the left ( image 15 series 4). There is no associated hemorrhage or mass lesion.  There is no  hydrocephalus or extra-axial fluid.  Mild atrophy is present.  Mild chronic microvascular ischemic change is noted in the periventricular and subcortical white matter.  Tiny focus chronic hemorrhage affects the left occipital lobe.  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  There is no midline shift.  The major intracranial vascular structures are patent.  Calvarium and skull base intact.  Upper cervical region unremarkable.  No acute sinus or mastoid disease. Negative orbits.  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  Chronic changes as described.  MRA HEAD  Findings: Mild nonstenotic irregularity left cavernous carotid. Focal 50% stenosis right cavernous ICA.  Shallow atheromatous outpouching inferior cavernous segment right ICA approximately 2 x 3 mm.  50% stenosis supraclinoid ICA bilaterally.  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  No flow limiting stenosis.  Left vertebral is the sole contributor to the basilar. Right vertebral poorly visualized distally, likely ending in PICA.  There is no proximal stenosis of the anterior, middle, or posterior cerebral arteries.  There is no cerebellar branch occlusion. There is mild irregularity of the distal MCA and PCA branches bilaterally.  No intracranial berry aneurysm  is seen.  IMPRESSION:  No flow limiting stenosis is observed.  Intracranial atherosclerotic change as described.   Original Report Authenticated By: Staci Righter, M.D.    Mr Mra Head/brain Wo Cm  04/28/2012  *RADIOLOGY REPORT*  Clinical Data:  Dizziness beginning yesterday.  History of diabetes and hypertension.  MRI HEAD WITHOUT CONTRAST MRA HEAD WITHOUT CONTRAST  Technique:  Multiplanar, multiecho pulse sequences of the brain and surrounding structures were obtained without intravenous contrast. Angiographic images of the head were obtained using MRA technique without  contrast.  Comparison:  08/09/2006 CT  MRI HEAD  Findings:  There is a subcentimeter acute infarct affecting the posterior limb internal capsule on the left ( image 15 series 4). There is no associated hemorrhage or mass lesion.  There is no hydrocephalus or extra-axial fluid.  Mild atrophy is present.  Mild chronic microvascular ischemic change is noted in the periventricular and subcortical white matter.  Tiny focus chronic hemorrhage affects the left occipital lobe.  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  There is no midline shift.  The major intracranial vascular structures are patent.  Calvarium and skull base intact.  Upper cervical region unremarkable.  No acute sinus or mastoid disease. Negative orbits.  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  Chronic changes as described.  MRA HEAD  Findings: Mild nonstenotic irregularity left cavernous carotid. Focal 50% stenosis right cavernous ICA.  Shallow atheromatous outpouching inferior cavernous segment right ICA approximately 2  x 3 mm.  50% stenosis supraclinoid ICA bilaterally.  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  No flow limiting stenosis.  Left vertebral is the sole contributor to the basilar. Right vertebral poorly visualized distally, likely ending in PICA.  There is no proximal stenosis of the anterior, middle, or posterior cerebral arteries.  There is no cerebellar branch occlusion. There is mild irregularity of the distal MCA and PCA branches bilaterally.  No intracranial berry aneurysm  is seen.  IMPRESSION:  No flow limiting stenosis is observed.  Intracranial atherosclerotic change as described.   Original Report Authenticated By: Staci Righter, M.D.     2D Echocardiogram  ---  Carotid Doppler  ---  EKG  normal EKG, normal sinus rhythm.   Therapy Recommendations PT - ; OT - ; ST passed swallow  Physical Exam   Pleasant  Middle aged male not in distress.Awake alert. Afebrile. Head is nontraumatic. Neck is supple without bruit. Hearing is normal. Cardiac exam no murmur or gallop. Lungs are clear to auscultation. Distal pulses are well felt.   Neurological Exam : Awake alert oriented x 3 normal speech and language.Fundi not visualized. Visual acuity and fields are adequate Mild lright lower face asymmetry. Tongue midline. No drift. Mild diminished fine finger movements on right. Orbits right over left upper extremity. Mild right grip weak.. Normal sensation . Normal coordination.  ASSESSMENT [name] is a 66 y.o. male presenting with left basal ganglia infarct . Imaging confirms. Infarct felt to be embolic secondary to hypertension, workup in progress.  On aspirin 81 mg orally every day and effient prior to admission. Now on aspirin 81 mg orally every day and clopidogrel 75 mg orally every day for secondary stroke prevention. Patient with resultant minimal right sided weakness.  -diabetes -hypertension -CAD  Hospital day # 1  TREATMENT/PLAN -Continue ASA and Plavix. Dr. Leonie Man discussed this change to plavix with the patients cardiologist, Dr. Ellyn Hack who agreed that this would be appropriate for patient with stent. Kyle continue for secondary stroke prevention. -add norvasc 5mg  daily for BP control -continue with therapies -progress diet.  Joesphine Bare, [facility],  MBA, [facility] Pager: [phone] 04/29/2012 8:13 AM  Scribe for Dr. Antony Contras, [place] Director. He has personally reviewed chart, pertinent data, examined the patient and developed the plan of care.  Antony Contras, MD Medical Director [facility] Pager: [phone] 04/29/2012 10:49 AM

## 2012-04-30 ENCOUNTER — Inpatient Hospital Stay (HOSPITAL_COMMUNITY): Payer: Medicaid Other

## 2012-04-30 DIAGNOSIS — I6330 Cerebral infarction due to thrombosis of unspecified cerebral artery: Secondary | ICD-10-CM

## 2012-04-30 DIAGNOSIS — G8100 Flaccid hemiplegia affecting unspecified side: Secondary | ICD-10-CM

## 2012-04-30 LAB — CBC
MCH: 26.8 pg (ref 26.0–34.0)
MCV: 78.2 fL (ref 78.0–100.0)
Platelets: 157 10*3/uL (ref 150–400)
RDW: 13.9 % (ref 11.5–15.5)
WBC: 6.8 10*3/uL (ref 4.0–10.5)

## 2012-04-30 LAB — BASIC METABOLIC PANEL
CO2: 24 mEq/L (ref 19–32)
Calcium: 10.1 mg/dL (ref 8.4–10.5)
Creatinine, Ser: 1.17 mg/dL (ref 0.50–1.35)
GFR calc non Af Amer: 65 mL/min — ABNORMAL LOW (ref >90)

## 2012-04-30 LAB — GLUCOSE, CAPILLARY
Glucose-Capillary: 167 mg/dL — ABNORMAL HIGH (ref 70–99)
Glucose-Capillary: 216 mg/dL — ABNORMAL HIGH (ref 70–99)
Glucose-Capillary: 162 mg/dL — ABNORMAL HIGH (ref 70–99)

## 2012-04-30 MED ORDER — WHITE PETROLATUM GEL
Status: AC
  Administered 2012-04-30: 14:00:00
  Filled 2012-04-30: qty 5

## 2012-04-30 MED ORDER — LISINOPRIL 20 MG PO TABS
20.0000 mg | ORAL_TABLET | Freq: Every day | ORAL | Status: DC
  Administered 2012-04-30 – 2012-05-01 (×2): 20 mg via ORAL
  Filled 2012-04-30 (×2): qty 1

## 2012-04-30 MED ORDER — INSULIN ASPART 100 UNIT/ML ~~LOC~~ SOLN
0.0000 [IU] | Freq: Three times a day (TID) | SUBCUTANEOUS | Status: DC
  Administered 2012-05-01: 5 [IU] via SUBCUTANEOUS
  Administered 2012-05-01: 3 [IU] via SUBCUTANEOUS

## 2012-04-30 NOTE — Progress Notes (Signed)
I met with patient, his wife, and daughter at bedside. Discussed inpt rehab venue and all in agreement to admit. I would like to admit tomorrow if workup complete. I will follow up in the morning. Please call for any questions. [phone]

## 2012-04-30 NOTE — Progress Notes (Signed)
MD notified of neuro changes, pt right grip much weaker and cannot move right arm, right leg weaker. MD ordered head Ct.

## 2012-04-30 NOTE — Progress Notes (Signed)
Name: [name] MRN: [id number] DOB: 1946/03/07    LOS: 2  Referring Provider:  EDP Reason for Referral:  Hypertensive emergency   PULMONARY / CRITICAL CARE MEDICINE  HPI:  66 yo with CAD (recent stent - 6/28 on ASA and Effient), poorly controlled hypertension and diabetes who presented to [facility] ED on 8/19 with dizziness and right sided weakness and numbness for about 24 hours.  Found to by hypertensive.  Head CT demonstrated acute left basal ganglia infarct.  Brief patient description:  66 yo with CAD Found to by hypertensive.  Head CT demonstrated acute left basal ganglia infarct.  Events Since Admission:   Current status: Pt alert and awake, no focal deficits,  BP is better  Vital Signs: Temp:  [97.8 F (36.6 C)-98.7 F (37.1 C)] 98.5 F (36.9 C) (08/21 0800) Pulse Rate:  [68-96] 90  (08/21 0900) Resp:  [0-26] 22  (08/21 0900) BP: (101-180)/(66-114) 146/90 mmHg (08/21 0900) SpO2:  [99 %-100 %] 100 % (08/21 0900)  Physical Examination: General:  No acute distress Neuro:  Awake, alert, cooperative, nonfocal HEENT:  PERRL Neck:  No JVD Cardiovascular:  RRR, no m/r/g Lungs:  Bilateral air entry, no w/r/r Abdomen:  Soft, nontender, nondistended, bowel sounds present Musculoskeletal:  Moves all extremities, equal strength Skin:  No rash  Active Problems:  DM (diabetes mellitus),poorly controlled  CAD (coronary artery disease), with CABG in 2009 after an MI  Dyslipidemia, (HDL 25)  Acute ischemic stroke  Hypertensive emergency  Hyperglycemia  ASSESSMENT AND PLAN  PULMONARY No results found for this basename: PHART:5,PCO2:5,PCO2ART:5,PO2ART:5,HCO3:5,O2SAT:5 in the last 168 hours  Ventilator Settings:    CXR:  NA ETT:  NA  A:  No active issues. P:   No interventions required  CARDIOVASCULAR No results found for this basename: TROPONINI:5,LATICACIDVEN:5, O2SATVEN:5,PROBNP:5 in the last 168 hours  ECG:  8/19 >>> NSR, nonspecific ST-T changes Lines:  NA  A: Hypertensive emergency.  H/o HTN, CAD (stent 6/28).  Dyslipidemia. P:  Goal SBP < 160 Continue ASA, Coreg, Imdur, Zocor Cont   Plavix (discussed with Dr. Posey Pronto - Cardiology) Labetalol PRN Resume lisinopril  RENAL  Lab 04/30/12 0655 04/28/12 1540 04/28/12 1320  NA 143 141 142  K 3.3* 3.6 --  CL 107 104 104  CO2 24 25 25   BUN 15 19 20   CREATININE 1.17 1.22 1.37*  CALCIUM 10.1 10.5 10.7*  MG -- -- --  PHOS -- -- --   Intake/Output      08/20 0701 - 08/21 0700 08/21 0701 - 08/22 0700   P.O. 540 480   I.V. (mL/kg) 701.3 (8) 40 (0.5)   Total Intake(mL/kg) 1241.3 (14.1) 520 (5.9)   Urine (mL/kg/hr) 1400 (0.7) 350   Total Output 1400 350   Net -158.7 +170        Urine Occurrence 1 x     Foley:  NA  A:  Chronic renal insufficiency, likely secondary to hypertension. P:   Saline lock IV Hold Lasix for now  GASTROINTESTINAL  Lab 04/28/12 1540  AST 12  ALT 12  ALKPHOS 66  BILITOT 0.4  PROT 8.3  ALBUMIN 4.4    A:  Passed swallow P:   PO diet   HEMATOLOGIC  Lab 04/30/12 0655 04/28/12 1540 04/28/12 1320  HGB 15.0 -- 15.6  HCT 43.7 -- 44.6  PLT 157 -- 184  INR -- -- 1.04  APTT -- 31 --   A:  No active issues. P:  Trend CBC  INFECTIOUS  Lab 04/30/12 0655 04/28/12 1320  WBC 6.8 9.7  PROCALCITON -- --   Cultures: NA  Antibiotics: NA  A:  No active issues. P:   No interventions required  ENDOCRINE  Lab 04/30/12 0823 04/30/12 0351 04/30/12 0022 04/29/12 1948 04/29/12 1548  GLUCAP 167* 151* 128* 178* 248*   A:  DM.  Hyperglycemia. P:   Goal CBG 120 to 180 SSI / CBG Hold Metformin  NEUROLOGIC  Head CT:  8/19 >>> nad Brain MRI / MRA:  8/19 >>> Acute subcentimeter infarct affects the posterior limb internal capsule on the left.  No flow limiting stenosis is observed. Intracranial atherosclerotic change.  A:  Acute basal ganglia infarct.   P:   Per Neurology>>they are signing off and recommend rehab consult Cont pt/ot Tfr to floor  non tele  BEST PRACTICE / DISPOSITION Level of Care:  ICU>>tfr to Floor non tele 8/21 Primary Service:  PCCM>>tfr to [facility] in am 8/22 Consultants:  Neurology, Cardiology Code Status:  Full Diet: PO diet DVT Px:  Not indicated GI Px:  SCDs Skin Integrity:  Intact Social / Family:  No family at bedside  Tfr to floor.  tfr to [facility] in AM 8/22 PCCM to sign off  [name], M.D. Beeper  [phone]  Cell  [phone]  If no response or cell goes to voicemail, call beeper [phone]  04/30/2012, 9:42 AM

## 2012-04-30 NOTE — Progress Notes (Signed)
Head CT completed

## 2012-04-30 NOTE — PMR Pre-admission (Signed)
PMR Admission Coordinator Pre-Admission Assessment  Patient: [name] is an 66 y.o., male MRN: [id number] DOB: 10-31-1945 Height: 5\' 8"  (172.7 cm) Weight: 87.8 kg (193 lb 9 oz)  Insurance Information   PRIMARY: self       Patient had applied after recent MI. Had taken early retirement at 66 years of age.   Medicaid Application Date: [id number]      Case Manager:  Disability Application Date: [id number]      Case Worker:   Emergency Contact Information Contact Information    Name Relation Home Work Mobile   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 RH-male with history of poorly controlled DM, CAD with stent 03/07/12; admitted on 08/19 with complaints dizziness and right sided weakness. MRI/MRA brain done revealing acute infarct left internal capsule, remote lacunae affecting bilateral brainstem and thalami, no flow limiting stenosis. 2D echo done revealing EF 30% with diffuse hypokinesis and moderate hypokinesis. Carotid dopplers with intimal wall thickening CCA. Patient's Effient changed to Plavix per input with cardiology. Had worsening of right sided weakness early am today and follow up CCT without evidence of hemorrhage or extension. CBG's greater than goal and A1C=8.7%. According to medication reconciliation, patient was only taking Metformin prior to admit for diabetes. Will need medication adjustments at discharge to improve home glycemic control. Consider adding Lantus 10 units daily while in the hospital (titrate according to fasting CBG's).  Continue ASA and Plavix or secondary stroke prevention given stent. No longer a candidate for effient due to acute stroke  -Consider ACCELERATE trial, use of Cholesteryl Ester Transfer Protein (CETP) Inhibitor: Potential of Evacetrapib to  treat atherosclerosis and CAD. [facility] will contact patient with information about trial, screen for inclusion.  -Stroke Service will sign off. Follow up with Dr. Leonie Man, [facility], in 2 months.  Total: 4  NIH   Past Medical History  Past Medical History  Diagnosis Date  . Coronary artery disease   . Diabetes mellitus   . Arthritis   . Hypertension   . Hypertensive crisis 03/07/2012  . Unstable angina 03/07/2012  . DM (diabetes mellitus),poorly controlled 03/07/2012  . CAD (coronary artery disease), with CABG in 2009 after an MI 03/07/2012  . Myocardial infarction     Family History  family history is not on file.  Prior Rehab/Hospitalizations:  none  Current Medications  Current facility-administered medications:amLODipine (NORVASC) tablet 5 mg, 5 mg, Oral, Daily, Abelina Bachelor, PA-C, 5 mg at 05/01/12 [id number];  aspirin chewable tablet 81 mg, 81 mg, Oral, Daily, Doree Fudge, MD, 81 mg at 05/01/12 0906;  atorvastatin (LIPITOR) tablet 40 mg, 40 mg, Oral, q1800, Oswald Hillock, MD, 40 mg at 04/30/12 1911;  carvedilol (COREG) tablet 12.5 mg, 12.5 mg, Oral, BID WC, Renaldo Reel, MD, 12.5 mg at 05/01/12 [id number] clopidogrel (PLAVIX) tablet 75 mg, 75 mg, Oral, Q breakfast, Doree Fudge, MD, 75 mg at 05/01/12 0806;  insulin aspart (novoLOG) injection 0-15 Units, 0-15 Units, Subcutaneous, TID PC & HS, Rhetta Mura Schorr, NP, 3 Units at 05/01/12 0906;  isosorbide mononitrate (IMDUR) 24 hr tablet 30 mg, 30 mg, Oral, Daily, Renaldo Reel, MD, 30 mg at 05/01/12 0906 labetalol (NORMODYNE,TRANDATE) injection 20 mg, 20 mg, Intravenous, Q2H PRN, Doree Fudge, MD, 20 mg at 05/01/12 0157;  lisinopril (PRINIVIL,ZESTRIL) tablet 20  mg, 20 mg, Oral, Daily, Elsie Stain, MD, 20 mg at 05/01/12 [id number];  nitroGLYCERIN (NITROSTAT) SL tablet 0.4 mg, 0.4 mg, Sublingual, Q5 min PRN, Oswald Hillock, MD;  potassium chloride SA (K-DUR,KLOR-CON) CR tablet 40 mEq, 40  mEq, Oral, Once, Belkys A Regalado, MD senna-docusate (Senokot-S) tablet 1 tablet, 1 tablet, Oral, QHS PRN, Oswald Hillock, MD;  white petrolatum (VASELINE) gel, , , , ;  DISCONTD: insulin aspart (novoLOG) injection 0-15 Units, 0-15 Units, Subcutaneous, Q4H, Doree Fudge, MD, 3 Units at 04/30/12 1658  Patients Current Diet: Cardiac  Precautions / Restrictions Precautions Precautions: Fall Restrictions Weight Bearing Restrictions: No   Prior Activity Level Community (5-7x/wk): active and independent Development worker, international aid / [place] Devices/Equipment: None Home Adaptive Equipment: Grab bars in shower  Prior Functional Level Prior Function Level of Independence: Independent Able to Take Stairs?: Yes Driving: Yes Vocation:  (retired 2008, 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[employer])  Current Functional Level Cognition  Arousal/Alertness: Awake/alert Overall Cognitive Status: Appears within functional limits for tasks assessed Overall Cognitive Status: Appears within functional limits for tasks assessed/performed Orientation Level: Oriented X4 Safety/Judgment: Appears intact    Extremity Assessment (includes Sensation/Coordination)  RUE ROM/Strength/Tone: Deficits RUE ROM/Strength/Tone Deficits: See ADL NOTE ABOVE FOR DETAILS RUE Sensation: Deficits RUE Coordination: Deficits  RLE ROM/Strength/Tone: Deficits RLE ROM/Strength/Tone Deficits: grossly weak at 3-to 3/5, brunstrom 3, and incooridinated and moving in synergy    ADLs  Eating/Feeding: Simulated;Maximal assistance (hand over hand needed) Where Assessed - Eating/Feeding: Chair Grooming: Simulated;Wash/dry hands;Wash/dry face;Maximal assistance [facility]) Where Assessed - Grooming: Supported sitting Upper Body Bathing: Simulated;Chest;Right arm;Left arm;Abdomen;Maximal assistance (HOH due to Rt arm decreased ROM and strength) Where Assessed - Upper Body Bathing: Supported sitting Toilet  Transfer: Simulated;+2 Total assistance Toilet Transfer: Patient Percentage: 80% Armed forces technical officer Method: Arts development officer: Raised toilet seat with arms (or 3-in-1 over toilet) Equipment Used: Rolling walker;Gait belt (HOH initially for Rt hand placement on RW) Transfers/Ambulation Related to ADLs: Pt ambulated ~75 feet (see PT note for details) with total +2 (A) for facilitation of weight shifting, pt leaning laterally on Rt side, (A) to facilitate Rt LE gait length/ stride. Pt with difficulty clearing Rt LE toes with fatigue and also demonstrates narrowed base of support. Pt with facilitation of shoulder width apart step pattern. Pt required stopping x2 to correct posture, rest from neuromuscular fatigue and hand placement. Pt tolerated exercise and mobility excellent demonstrating improvement remains CIR candidate.  ADL Comments: Pt demonstrates Rt side paresis. Pt with proprioception deficits, decreased supination/ pronation, 3- out 5 wrist extension, elbow flexion / extension AROM increased with associated reaction (RT and LT UE at same time) Pt demonstrated better return with RT UE only after associated reaction neuromuscular education. Pt AROM shoulder flexion ~70 degrees and pt using compensatory methods to reach 90 degrees. Pt grip strength 3- out 5 (very loose grasp). Pt able to activate all aspects of Rt UE. Pt demonstrated tricep 4 out 5 taking resistance with associated testing RT UE.     Mobility  Bed Mobility: Not assessed (in chair on arrival) Supine to Sit: 5: Supervision Sitting - Scoot to Edge of Bed: 5: Supervision    Transfers  Transfers: Sit to Stand;Stand to Sit Sit to Stand: 1: +2 Total assist;With upper extremity assist;From chair/3-in-1 (facilitation of weight shifting, proper alignment) Sit to Stand: Patient Percentage: 80% (facilitation of trunk activation, Rt hand placement ) Stand to Sit: 1: +2 Total assist;With upper extremity assist;To  chair/3-in-1 Stand to Sit: Patient Percentage: 80%    Ambulation / Gait / Stairs / Emergency planning/management officer  Ambulation/Gait Ambulation/Gait Assistance: 1: +2 Total assist Ambulation/Gait: Patient Percentage: 60% Ambulation Distance (Feet): 75 Feet Assistive device: Rolling walker Ambulation/Gait Assistance Details: ataxic gait R LE/ adducted placement at foot contact; moderately heavy list to the R, needing w/shift A Left Gait Pattern: Step-through pattern;Decreased step length - right;Decreased step length - left;Decreased stride length;Decreased weight shift to left;Shuffle;Ataxic Stairs: No Wheelchair Mobility Wheelchair Mobility: No    Posture / Balance Dynamic Sitting Balance Dynamic Sitting - Balance Support: Bilateral upper extremity supported;Feet supported;During functional activity Dynamic Sitting - Level of Assistance: 5: Stand by assistance;4: Min assist Dynamic Sitting Balance - Compensations: while putting on socks at EOB, pt began listing posteriorly and to the R, but could reover once he stopped trying to put on his socks Dynamic Sitting - Balance Activities: Other (comment) (putting on his socks) Static Standing Balance Static Standing - Balance Support: Bilateral upper extremity supported;During functional activity Static Standing - Level of Assistance: 4: Min assist Static Standing - Comment/# of Minutes: Pt required v/c to attend to Rt UE (slipping off RW) and to maintain upright posture. Pt fatigued with prolonged standing and Rt LE attempting to buckle. Pt with Rt LE in hyperextension to maintain static standing.     Previous Home Environment Living Arrangements: Spouse/significant other Lives With: Spouse Available Help at Discharge: Family Type of Home: Apartment Home Layout: One level Home Access: Stairs to enter Entrance Stairs-Rails: Psychiatric nurse of Steps: several Bathroom Shower/Tub: Chiropodist: Standard Bathroom  Accessibility: Yes How Accessible: Accessible via walker Tibes: No  Discharge Living Setting Plans for Discharge Living Setting: Patient's home;Lives with (comment);Apartment (wife) Type of Home at Discharge: Apartment Discharge Home Layout: One level Discharge Home Access: Stairs to enter Entrance Stairs-Rails: Can reach both;Right;Left Entrance Stairs-Number of Steps: several Discharge Bathroom Shower/Tub: Tub/shower unit Discharge Bathroom Toilet: Standard Discharge Bathroom Accessibility: Yes How Accessible: Accessible via walker Do you have any problems obtaining your medications?: No  Social/Family/Support Systems Patient Roles: Spouse;Parent (son and daughter) Contact Information: Leonie Green, wife Anticipated Caregiver: wife and daughter Anticipated Caregiver's Contact Information: see above Ability/Limitations of Caregiver: none Caregiver Availability: 24/7 Discharge Plan Discussed with Primary Caregiver: Yes Is Caregiver In Agreement with Plan?: Yes Does Caregiver/Family have Issues with Lodging/Transportation while Pt is in Rehab?: No Patient's daughter works at [employer] but prefers Dad to receive inpt rehab, not SNF.  Goals/Additional Needs Patient/Family Goal for Rehab: supervision with PT, supervision to min with OT, 100 intelligebility with SLP Expected length of stay: ELOS 10 to 14 days Pt/Family Agrees to Admission and willing to participate: Yes Program Orientation Provided & Reviewed with Pt/Caregiver Including Roles  & Responsibilities: Yes  Patient Condition: This patient's condition remains as documented in the Consult dated 04/30/12, in which the Rehabilitation Physician determined and documented that the patient's condition is appropriate for intensive rehabilitative care in an inpatient rehabilitation facility.  Preadmission Screen Completed By:  Cleatrice Burke, 05/01/2012 11:56  AM ______________________________________________________________________   Discussed status with Dr. Naaman Plummer on 05/01/12 at  1143 and received telephone approval for admission today.  Admission Coordinator:  Cleatrice Burke, time [id number] Date 05/01/12.

## 2012-04-30 NOTE — Progress Notes (Signed)
Inpatient Diabetes Program Recommendations  AACE/ADA: New Consensus Statement on Inpatient Glycemic Control (2013)  Target Ranges:  Prepandial:   less than 140 mg/dL      Peak postprandial:   less than 180 mg/dL (1-2 hours)      Critically ill patients:  140 - 180 mg/dL   Reason for Visit: CBG's greater than goal and A1C=8.7%.  According to medication reconciliation, patient was only taking Metformin prior to admit for diabetes. Will need medication adjustments at discharge to improve home glycemic control.   Consider adding Lantus 10 units daily while in the hospital (titrate according to fasting CBG's).      Note: Will follow.

## 2012-04-30 NOTE — Progress Notes (Signed)
eLink Physician-Brief Progress Note Patient Name: [name] DOB: [birth year] MRN: [id number]  Date of Service  04/30/2012   HPI/Events of Note  Call from nurse reporting his neurological status has changed - weaker on affected side.    eICU Interventions  Non-contrasted CT ordered   Intervention Category Minor Interventions: Clinical assessment - ordering diagnostic tests  Choya Tornow 04/30/2012, 12:32 AM

## 2012-04-30 NOTE — Consult Note (Signed)
Physical Medicine and Rehabilitation Consult Reason for Consult: dizziness, right sided weakness Referring Physician:  Dr. Leonie Man   HPI: [name] is a 66 y.o. RH-male with history of poorly controlled DM, CAD with stent 03/07/12; admitted on 08/19 with complaints dizziness and right sided weakness. MRI/MRA brain done revealing acute infarct left internal capsule, remote lacunae affecting bilateral brainstem and thalami, no flow limiting stenosis. 2D echo done revealing EF 30% with diffuse hypokinesis and moderate hypokinesis. Carotid dopplers with intimal wall thickening CCA. Patient's Effient changed to Plavix per input with cardiology.   Had worsening of right sided weakness early am today and follow up CCT without evidence of hemorrhage or extension.   Review of Systems  HENT: Negative for hearing loss.   Eyes: Negative for blurred vision and double vision.  Respiratory: Negative for cough and shortness of breath.   Cardiovascular: Negative for chest pain and palpitations.  Gastrointestinal: Positive for constipation.  Genitourinary: Negative for urgency and frequency.  Musculoskeletal: Negative for myalgias and back pain.  Neurological: Positive for focal weakness and weakness. Negative for dizziness and headaches.  Psychiatric/Behavioral: The patient does not have insomnia.    Past Medical History  Diagnosis Date  . Coronary artery disease   . Diabetes mellitus   . Arthritis   . Hypertension   . Hypertensive crisis 03/07/2012  . Unstable angina 03/07/2012  . DM (diabetes mellitus),poorly controlled 03/07/2012  . CAD (coronary artery disease), with CABG in 2009 after an MI 03/07/2012  . Myocardial infarction    Past Surgical History  Procedure Date  . Cardiac surgery   . Coronary artery bypass graft   . Cardiac catheterization    History reviewed. No pertinent family history.  Social History:  reports that he quit smoking about 30 years ago. He has never used smokeless  tobacco. He reports that he drinks about .6 ounces of alcohol per week. He reports that he does not use illicit drugs.  Allergies: No Known Allergies  Medications Prior to Admission  Medication Sig Dispense Refill  . acetaminophen (TYLENOL) 325 MG tablet Take 650 mg by mouth every 4 (four) hours as needed. For pain/headache      . aspirin EC 81 MG tablet Take 81 mg by mouth daily.      . bd getting started take home kit MISC 1 kit by Other route once.      . carvedilol (COREG) 12.5 MG tablet Take 12.5 mg by mouth 2 (two) times daily with a meal.      . furosemide (LASIX) 20 MG tablet Take 20 mg by mouth daily.      . isosorbide mononitrate (IMDUR) 30 MG 24 hr tablet Take 30 mg by mouth daily.      Marland Kitchen lisinopril (PRINIVIL,ZESTRIL) 20 MG tablet Take 20 mg by mouth daily.      . metFORMIN (GLUCOPHAGE) 500 MG tablet Take 500 mg by mouth 2 (two) times daily with a meal.      . prasugrel (EFFIENT) 10 MG TABS Take 10 mg by mouth daily.      . simvastatin (ZOCOR) 80 MG tablet Take 40 mg by mouth every evening.      . nitroGLYCERIN (NITROSTAT) 0.4 MG SL tablet Place 0.4 mg under the tongue every 5 (five) minutes as needed. For chest pain        Home: Home Living Lives With: Spouse Available Help at Discharge: Family Type of Home: Apartment Home Access: Stairs to enter [employer] of Steps: several  Entrance Stairs-Rails: Right;Left Home Layout: One level Bathroom Shower/Tub: Chiropodist: Standard Home Adaptive Equipment: Grab bars in shower  Functional History: Prior Function Able to Take Stairs?: Yes Driving: Yes Vocation: Retired Functional Status:  Mobility: Bed Mobility Bed Mobility: Supine to Sit;Sitting - Scoot to Edge of Bed Supine to Sit: 5: Supervision Sitting - Scoot to [employer] of Bed: 5: Supervision Transfers Transfers: Sit to Stand;Stand to Sit Sit to Stand: 4: Min assist Stand to Sit: 4: Min assist Ambulation/Gait Ambulation/Gait  Assistance: 3: Mod assist Ambulation Distance (Feet): 45 Feet Assistive device: 1 person hand held assist;Other (Comment) (iv pole) Ambulation/Gait Assistance Details: ataxic gait R LE, generally dragging behind or having trouble swinging through.  Heavy list R which worsened with fatigue. Gait Pattern: Step-through pattern;Decreased step length - right;Decreased step length - left;Decreased stride length;Decreased weight shift to left;Shuffle;Ataxic Stairs: No Wheelchair Mobility Wheelchair Mobility: No  ADL:    Cognition: Cognition Overall Cognitive Status: Appears within functional limits for tasks assessed Arousal/Alertness: Awake/alert Orientation Level: Oriented X4 Safety/Judgment: Appears intact Cognition Overall Cognitive Status: Appears within functional limits for tasks assessed/performed Arousal/Alertness: Awake/alert Orientation Level: Appears intact for tasks assessed Behavior During Session: [facility] for tasks performed  Blood pressure 180/107, pulse 91, temperature 98.5 F (36.9 C), temperature source Oral, resp. rate 17, height 5\' 8"  (1.727 m), weight 87.8 kg (193 lb 9 oz), SpO2 100.00%. Physical Exam  Nursing note and vitals reviewed. Constitutional: He is oriented to person, place, and time. He appears well-developed and well-nourished.  HENT:  Head: Normocephalic and atraumatic.  Eyes: Pupils are equal, round, and reactive to light.  Neck: Normal range of motion. Neck supple.  Cardiovascular: Normal rate and regular rhythm.   Pulmonary/Chest: Breath sounds normal.  Abdominal: Soft. Bowel sounds are normal.  Musculoskeletal: He exhibits no edema.  Neurological: He is alert and oriented to person, place, and time.       Right facial weakness.  Speech clear. Follow commands without difficulty.  Right hemiparesis UE>LE-?motor apraxia. Decreased insight with poor awareness of deficits.   Skin: Skin is warm and dry.  motor strength is 2 minus/5 in the right deltoid,  biceps, triceps, grip as well as right hip flexor knee extensor and ankle dorsiflexor Left side is 5/5 Sensation is intact to light touch in both upper and lower extremities  Results for orders placed during the hospital encounter of 04/28/12 (from the past 24 hour(s))  GLUCOSE, CAPILLARY     Status: Abnormal   Collection Time   04/29/12 12:22 PM      Component Value Range   Glucose-Capillary 160 (*) 70 - 99 mg/dL  GLUCOSE, CAPILLARY     Status: Abnormal   Collection Time   04/29/12  3:48 PM      Component Value Range   Glucose-Capillary 248 (*) 70 - 99 mg/dL   Comment 1 Notify RN     Comment 2 Documented in Chart    GLUCOSE, CAPILLARY     Status: Abnormal   Collection Time   04/29/12  7:48 PM      Component Value Range   Glucose-Capillary 178 (*) 70 - 99 mg/dL   Comment 1 Notify RN     Comment 2 Documented in Chart    GLUCOSE, CAPILLARY     Status: Abnormal   Collection Time   04/30/12 12:22 AM      Component Value Range   Glucose-Capillary 128 (*) 70 - 99 mg/dL  GLUCOSE, CAPILLARY  Status: Abnormal   Collection Time   04/30/12  3:51 AM      Component Value Range   Glucose-Capillary 151 (*) 70 - 99 mg/dL  BASIC METABOLIC PANEL     Status: Abnormal   Collection Time   04/30/12  6:55 AM      Component Value Range   Sodium 143  135 - 145 mEq/L   Potassium 3.3 (*) 3.5 - 5.1 mEq/L   Chloride 107  96 - 112 mEq/L   CO2 24  19 - 32 mEq/L   Glucose, Bld 162 (*) 70 - 99 mg/dL   BUN 15  6 - 23 mg/dL   Creatinine, Ser 1.17  0.50 - 1.35 mg/dL   Calcium 10.1  8.4 - 10.5 mg/dL   GFR calc non Af Amer 65 (*) >90 mL/min   GFR calc Af Amer 75 (*) >90 mL/min  CBC     Status: Normal   Collection Time   04/30/12  6:55 AM      Component Value Range   WBC 6.8  4.0 - 10.5 K/uL   RBC 5.59  4.22 - 5.81 MIL/uL   Hemoglobin 15.0  13.0 - 17.0 g/dL   HCT 43.7  39.0 - 52.0 %   MCV 78.2  78.0 - 100.0 fL   MCH 26.8  26.0 - 34.0 pg   MCHC 34.3  30.0 - 36.0 g/dL   RDW 13.9  11.5 - 15.5 %    Platelets 157  150 - 400 K/uL   Ct Head Wo Contrast  04/30/2012  *RADIOLOGY REPORT*  Clinical Data: Mental status change.  Stroke  CT HEAD WITHOUT CONTRAST  Technique:  Contiguous axial images were obtained from the base of the skull through the vertex without contrast.  Comparison: MRI 04/28/2012  Findings: Small area of acute infarction in the posterior limb internal capsule on the left, similar to the MRI.  Negative for hemorrhage.  No other areas of acute infarct.  Ventricle size is normal.  No mass lesion.  IMPRESSION: Small area infarction in the posterior limb internal capsule on the left is unchanged.  No hemorrhage or   extension of infarction is identified.   Original Report Authenticated By: Truett Perna, M.D.      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 Does the need for close, 24 hr/day medical supervision in concert with the patient's rehab needs make it unreasonable for this patient to be served in a less intensive setting? Yes 2. Co-Morbidities requiring supervision/potential complications: diabetes, coronary artery disease, hypertension 3. Due to bladder management, safety, skin/wound care, disease management, medication administration and patient education, does the patient require 24 hr/day rehab nursing? Yes 4. Does the patient require coordinated care of a physician, rehab nurse, PT (1-2 hrs/day, 5 days/week), OT (once to hrs/day, 5 days/week) and SLP (0.5-1 hrs/day, 5 days/week) to address physical and functional deficits in the context of the above medical diagnosis(es)? Yes Addressing deficits in the following areas: balance, endurance, locomotion, strength, transferring, bathing, dressing, toileting and speech 5. Can the patient actively participate in an intensive therapy program of at least 3 hrs of therapy per day at least 5 days per week? Yes 6. The potential for patient to make measurable gains while on  inpatient rehab is excellent 7. Anticipated functional outcomes upon discharge from inpatient rehab are supervision mobility with PT, supervision ADLs with OT, 100% speech intelligibility with SLP. 8. Estimated rehab length of stay  to reach the above functional goals is: 10-14 days 9. Does the patient have adequate social supports to accommodate these discharge functional goals? Yes 10. Anticipated D/C setting: Home 11. Anticipated post D/C treatments: [place] therapy 12. Overall Rehab/Functional Prognosis: excellent  RECOMMENDATIONS: This patient's condition is appropriate for continued rehabilitative care in the following setting: CIR Patient has agreed to participate in recommended program. Yes Note that insurance prior authorization may be required for reimbursement for recommended care.  Comment:    04/30/2012

## 2012-04-30 NOTE — Progress Notes (Signed)
Stroke Team Progress Note  HISTORY [name] is an 66 y.o. male who reports that at about 430PM on 04/28/12 he had an acute onset of dizziness. When attempted to get up felt as if his right side was not keeping up with his left side. Symptoms worsened today and patient presented for evaluation. Dizziness improved somewhat while in ED. MR reveals  a small left basal ganglia infarct. Patient had a cardiac stent placement on 6/28. Has been on Effient and Baby ASA since that time. Dr. Ellyn Hack.  SUBJECTIVE Patient and chair at bedside. Eating breakfast. No complaints.  OBJECTIVE Most recent Vital Signs: Filed Vitals:   04/30/12 0400 04/30/12 0500 04/30/12 0600 04/30/12 0700  BP: 167/104 125/84 140/81 172/105  Pulse: 85 81 83 96  Temp: 98.7 F (37.1 C)     TempSrc: Oral     Resp: 5 0 0 14  Height:      Weight:      SpO2: 100% 99% 99% 100%   CBG (last 3)   Basename 04/30/12 0351 04/30/12 0022 04/29/12 1948  GLUCAP 151* 128* 178*   Intake/Output from previous day: 08/20 0701 - 08/21 0700 In: 1221.3 [P.O.:540; I.V.:681.3] Out: 1400 [Urine:1400]  IV Fluid Intake:     . sodium chloride 20 mL/hr at 04/29/12 1900   MEDICATIONS    . amLODipine  5 mg Oral Daily  . aspirin  81 mg Oral Daily  . atorvastatin  40 mg Oral q1800  . carvedilol  12.5 mg Oral BID WC  . clopidogrel  75 mg Oral Q breakfast  . insulin aspart  0-15 Units Subcutaneous Q4H  . isosorbide mononitrate  30 mg Oral Daily   PRN:  labetalol, nitroGLYCERIN, senna-docusate  Diet:  Cardiac Activity:  Ambulate DVT Prophylaxis:  SCD  CLINICALLY SIGNIFICANT STUDIES Basic Metabolic Panel:   Lab [id number] 04/28/12 1320  NA 141 142  K 3.6 3.6  CL 104 104  CO2 25 25  GLUCOSE 173* 180*  BUN 19 20  CREATININE 1.22 1.37*  CALCIUM 10.5 10.7*  MG -- --  PHOS -- --   Liver Function Tests:   Lab 04/28/12 1540  AST 12  ALT 12  ALKPHOS 66  BILITOT 0.4  PROT 8.3  ALBUMIN 4.4   CBC:   Lab 04/30/12 0655  04/28/12 1320  WBC 6.8 9.7  NEUTROABS -- --  HGB 15.0 15.6  HCT 43.7 44.6  MCV 78.2 78.1  PLT 157 184   Coagulation:   Lab 04/28/12 1320  LABPROT 13.8  INR 1.04   Lipid Panel    Component Value Date/Time   CHOL 187 04/29/2012 0402   TRIG 101 04/29/2012 0402   HDL 37* 04/29/2012 0402   CHOLHDL 5.1 04/29/2012 0402   VLDL 20 04/29/2012 0402   LDLCALC 130* 04/29/2012 0402   HgbA1C  Lab Results  Component Value Date   HGBA1C 8.7* 04/29/2012   Urine Drug Screen:   No results found for this basename: labopia,  cocainscrnur,  labbenz,  amphetmu,  thcu,  labbarb    Alcohol Level: No results found for this basename: ETH:2 in the last 168 hours  CT Head   04/30/2012 Small area infarction in the posterior limb internal capsule on the left is unchanged.  No hemorrhage or   extension of infarction is identified.   Marland Kitchen    MRI Brain  04/28/2012  Acute subcentimeter infarct affects the posterior limb internal capsule on the left.  Chronic changes as described.  MRA Brain 04/28/2012   No flow limiting stenosis is observed.  Intracranial atherosclerotic change as described.    2D Echocardiogram  EF 30-35% with no source of embolus. Diffuse hypokinesis.   Carotid Doppler  Bilateral: intimal wall thickening CCA. Very mild soft plaque noted orign ICA. Right vertebral artery flow not insonated. Left vertebral artery flow is antegrade.  EKG  normal EKG, normal sinus rhythm.   Therapy Recommendations PT - CIR; OT - ; ST passed swallow  Physical Exam  Pleasant  Middle aged male not in distress.Awake alert. Afebrile. Head is nontraumatic. Neck is supple without bruit. Hearing is normal. Cardiac exam no murmur or gallop. Lungs are clear to auscultation. Distal pulses are well felt.   Neurological Exam : Awake alert oriented x 3 normal speech and language.Fundi not visualized. Visual acuity and fields are adequate Mild lright lower face asymmetry. Tongue midline. No drift. Mild diminished fine finger  movements on right. Orbits right over left upper extremity. Mild right grip weak.. Normal sensation . Normal coordination.   ASSESSMENT [name] is a 66 y.o. male presenting with left basal ganglia infarct. Imaging confirms. Infarct felt to be thrombotic secondary to small vessel disease due to hypertension, workup completed  On aspirin 81 mg orally every day and effient prior to admission. Now on aspirin 81 mg orally every day and clopidogrel 75 mg orally every day for secondary stroke prevention. Patient with resultant minimal right sided weakness.  -diabetes -hypertension -CAD With recent stent placement. 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 No longer a candidate for effient due to acute stroke -rehabilitation consult -Consider ACCELERATE trial, use of Cholesteryl Ester Transfer Protein (CETP) Inhibitor: Potential of Evacetrapib to treat atherosclerosis and CAD. [facility] will contact patient with information about trial, screen for inclusion. -Stroke Service will sign off. Follow up with Dr. Leonie Man, [facility], in 2 months.  Burnetta Sabin, MSN, RN, ANVP-BC, ANP-BC, [facility] Pager: [phone] 04/30/2012 8:07 AM  Scribe for Dr. Antony Contras, [address] Director, who has personally reviewed chart, pertinent data, examined the patient and developed the plan of care. Pager:  [phone]

## 2012-04-30 NOTE — Progress Notes (Signed)
Physical Therapy Treatment Patient Details Name: [name] MRN: [id number] DOB: 1946-03-28 Today's Date: 04/30/2012 Time: 1027-1056 PT Time Calculation (min): 29 min  PT Assessment / Plan / Recommendation Comments on Treatment Session  Good rehab candidate. Follows commands and tactile facilitation well.    Follow Up Recommendations  Inpatient Rehab    Barriers to Discharge        Equipment Recommendations  Defer to next venue    Recommendations for Other Services Rehab consult  Frequency Min 4X/week   Plan Discharge plan remains appropriate    Precautions / Restrictions Precautions Precautions: Fall   Pertinent Vitals/Pain     Mobility  Bed Mobility Bed Mobility: Not assessed Transfers Transfers: Sit to Stand;Stand to Sit Sit to Stand: 4: Min assist;Without upper extremity assist;From chair/3-in-1 Stand to Sit: 4: Min assist;With upper extremity assist;To chair/3-in-1 Details for Transfer Assistance: vc's for safe hand placement; stability assist Ambulation/Gait Ambulation/Gait Assistance: 1: +2 Total assist Ambulation/Gait: Patient Percentage: 60% Ambulation Distance (Feet): 75 Feet Assistive device: Rolling walker Ambulation/Gait Assistance Details: ataxic gait R LE/ adducted placement at foot contact; moderately heavy list to the R, needing w/shift A Left Gait Pattern: Step-through pattern;Decreased step length - right;Decreased step length - left;Decreased stride length;Decreased weight shift to left;Shuffle;Ataxic Stairs: No Modified Rankin (Stroke Patients Only) Modified Rankin: Moderately severe disability    Exercises     PT Diagnosis:    PT Problem List:   PT Treatment Interventions:     PT Goals Acute Rehab PT Goals PT Goal Formulation: With patient/family Time For Goal Achievement: 05/13/12 Potential to Achieve Goals: Good PT Goal: Stand to Sit - Progress: Progressing toward goal PT Transfer Goal: Bed to Chair/Chair to Bed - Progress:  Progressing toward goal PT Goal: Ambulate - Progress: Progressing toward goal  Visit Information  Last PT Received On: 04/30/12 Assistance Needed: +2    Subjective Data  Subjective: I guess I'm about the same   Cognition  Overall Cognitive Status: Appears within functional limits for tasks assessed/performed Arousal/Alertness: Awake/alert Orientation Level: Appears intact for tasks assessed Behavior During Session: [facility] for tasks performed    Balance  Balance Balance Assessed: Yes Static Standing Balance Static Standing - Balance Support: Bilateral upper extremity supported;During functional activity Static Standing - Level of Assistance: 4: Min assist Static Standing - Comment/# of Minutes: min assist to get patient into midline and then he could maintain with RW assist and min guard  End of Session PT - End of Session Activity Tolerance: Patient tolerated treatment well;Patient limited by fatigue Patient left: in chair;with call bell/phone within reach;with family/visitor present Nurse Communication: Mobility status   GP     Anissia Wessells, Tessie Fass 04/30/2012, 11:52 AM  04/30/2012  Donnella Sham, [place] [phone] (pager)

## 2012-04-30 NOTE — Evaluation (Signed)
Occupational Therapy Evaluation Patient Details Name: [name] MRN: [id number] DOB: [birth year] Today's Date: 04/30/2012 Time: [id number] OT Time Calculation (min): 34 min  OT Assessment / Plan / Recommendation Clinical Impression  66 yo male admitted with CT scan Acute subcentimeter infarct affects the posterior limb internal capsule on the left. Ot to follow acutely . Recommend CIR for d/c planning.    OT Assessment  Patient needs continued OT Services    Follow Up Recommendations  Inpatient Rehab    Barriers to Discharge      Equipment Recommendations  Defer to next venue    Recommendations for Other Services Rehab consult  Frequency  Min 2X/week    Precautions / Restrictions Precautions Precautions: Fall   Pertinent Vitals/Pain none    ADL  Eating/Feeding: Simulated;Maximal assistance (hand over hand needed) Where Assessed - Eating/Feeding: Chair Grooming: Simulated;Wash/dry hands;Wash/dry face;Maximal assistance [facility]) Where Assessed - Grooming: Supported sitting Upper Body Bathing: Simulated;Chest;Right arm;Left arm;Abdomen;Maximal assistance (HOH due to Rt arm decreased ROM and strength) Where Assessed - Upper Body Bathing: Supported sitting Toilet Transfer: Simulated;+2 Total assistance Toilet Transfer: Patient Percentage: 80% Armed forces technical officer Method: Arts development officer: Raised toilet seat with arms (or 3-in-1 over toilet) Equipment Used: Rolling walker;Gait belt (HOH initially for Rt hand placement on RW) Transfers/Ambulation Related to ADLs: Pt ambulated ~75 feet (see PT note for details) with total +2 (A) for facilitation of weight shifting, pt leaning laterally on Rt side, (A) to facilitate Rt LE gait length/ stride. Pt with difficulty clearing Rt LE toes with fatigue and also demonstrates narrowed base of support. Pt with facilitation of shoulder width apart step pattern. Pt required stopping x2 to correct posture, rest from neuromuscular  fatigue and hand placement. Pt tolerated exercise and mobility excellent demonstrating improvement remains CIR candidate.  ADL Comments: Pt demonstrates Rt side paresis. Pt with proprioception deficits, decreased supination/ pronation, 3- out 5 wrist extension, elbow flexion / extension AROM increased with associated reaction (RT and LT UE at same time) Pt demonstrated better return with RT UE only after associated reaction neuromuscular education. Pt AROM shoulder flexion ~70 degrees and pt using compensatory methods to reach 90 degrees. Pt grip strength 3- out 5 (very loose grasp). Pt able to activate all aspects of Rt UE. Pt demonstrated tricep 4 out 5 taking resistance with associated testing RT UE.     OT Diagnosis: Generalized weakness;Paresis (Rt)  OT Problem List: Decreased strength;Decreased range of motion;Decreased activity tolerance;Impaired balance (sitting and/or standing);Decreased coordination;Decreased safety awareness;Decreased knowledge of use of DME or AE;Decreased knowledge of precautions;Impaired UE functional use;Impaired sensation OT Treatment Interventions: Self-care/ADL training;Neuromuscular education;DME and/or AE instruction;Therapeutic activities;Patient/family education;Balance training;Therapeutic exercise   OT Goals Acute Rehab OT Goals OT Goal Formulation: With patient/family Time For Goal Achievement: 05/14/12 Potential to Achieve Goals: Good ADL Goals Pt Will Perform Grooming: with min assist;Supported;Sitting, chair ADL Goal: Grooming - Progress: Goal set today Pt Will Perform Upper Body Bathing: with min assist;Sitting at sink;Supported ADL Goal: Scientist, clinical (histocompatibility and immunogenetics) - Progress: Goal set today Pt Will Perform Upper Body Dressing: with min assist;Supported;Sitting, chair ADL Goal: Upper Body Dressing - Progress: Goal set today Pt Will Transfer to Toilet: with mod assist;3-in-1;Ambulation ADL Goal: Toilet Transfer - Progress: Goal set today Miscellaneous OT  Goals Miscellaneous OT Goal #1: Pt will perform bed mobility to the Lt side with Min (A) as precursor to adls OT Goal: Miscellaneous Goal #1 - Progress: Goal set today  Visit Information  Last OT Received On: 04/30/12  Assistance Needed: +2 PT/OT Co-Evaluation/Treatment: Yes    Subjective Data  Subjective: "I dont know what to ask" - Pt overwhelmed with the current situation. Welcoming all education or assistance staff can offer Patient Stated Goal: to return home with wife   Prior Functioning  Vision/Perception  Home Living Lives With: Spouse Available Help at Discharge: Family Type of Home: Apartment Home Access: Stairs to enter Technical brewer of Steps: several Entrance Stairs-Rails: Right;Left Home Layout: One level Bathroom Shower/Tub: Chiropodist: Standard Bathroom Accessibility: Yes How Accessible: Accessible via walker Home Adaptive Equipment: Grab bars in shower Prior Function Level of Independence: Independent Able to Take Stairs?: Yes Driving: Yes Vocation: Retired Corporate investment banker: No difficulties Dominant Hand: Right   Vision - Assessment Vision Assessment: Vision tested (read clock 10:28 AM)  Cognition  Overall Cognitive Status: Appears within functional limits for tasks assessed/performed Arousal/Alertness: Awake/alert Orientation Level: Appears intact for tasks assessed Behavior During Session: [facility] for tasks performed    Extremity/Trunk Assessment Right Upper Extremity Assessment RUE ROM/Strength/Tone: Deficits RUE ROM/Strength/Tone Deficits: See ADL NOTE ABOVE FOR DETAILS RUE Sensation: Deficits RUE Coordination: Deficits Left Upper Extremity Assessment LUE ROM/Strength/Tone: Within functional levels LUE Coordination: WFL - gross/fine motor   Mobility Bed Mobility Bed Mobility: Not assessed (in chair on arrival) Transfers Sit to Stand: 1: +2 Total assist;With upper extremity assist;From chair/3-in-1  (facilitation of weight shifting, proper alignment) Sit to Stand: Patient Percentage: 80% (facilitation of trunk activation, Rt hand placement ) Stand to Sit: 1: +2 Total assist;With upper extremity assist;To chair/3-in-1 Stand to Sit: Patient Percentage: 80% Details for Transfer Assistance: Pt progressing well and this session total +2 due to helping facilitate neuromuscular reeducation for proper body mechanics.   Exercise    Balance Balance Balance Assessed: Yes Static Standing Balance Static Standing - Balance Support: Bilateral upper extremity supported;During functional activity Static Standing - Level of Assistance: 4: Min assist Static Standing - Comment/# of Minutes: Pt required v/c to attend to Rt UE (slipping off RW) and to maintain upright posture. Pt fatigued with prolonged standing and Rt LE attempting to buckle. Pt with Rt LE in hyperextension to maintain static standing.  End of Session OT - End of Session Equipment Utilized During Treatment: Gait belt Activity Tolerance: Patient tolerated treatment well Patient left: in chair;with call bell/phone within reach;with family/visitor present (wife) Nurse Communication: Mobility status;Precautions  GO     Veneda Melter 04/30/2012, 12:10 PM Pager: [phone]

## 2012-05-01 ENCOUNTER — Inpatient Hospital Stay (HOSPITAL_COMMUNITY)
Admission: RE | Admit: 2012-05-01 | Discharge: 2012-05-21 | DRG: 945 | Disposition: A | Discharge status: Home / Self Care | Payer: Medicaid Other | Source: Ambulatory Visit | Attending: Physical Medicine & Rehabilitation | Admitting: Physical Medicine & Rehabilitation

## 2012-05-01 DIAGNOSIS — E119 Type 2 diabetes mellitus without complications: Secondary | ICD-10-CM | POA: Diagnosis present

## 2012-05-01 DIAGNOSIS — Z951 Presence of aortocoronary bypass graft: Secondary | ICD-10-CM

## 2012-05-01 DIAGNOSIS — E876 Hypokalemia: Secondary | ICD-10-CM | POA: Diagnosis present

## 2012-05-01 DIAGNOSIS — I10 Essential (primary) hypertension: Secondary | ICD-10-CM

## 2012-05-01 DIAGNOSIS — Z9861 Coronary angioplasty status: Secondary | ICD-10-CM

## 2012-05-01 DIAGNOSIS — I255 Ischemic cardiomyopathy: Secondary | ICD-10-CM | POA: Diagnosis present

## 2012-05-01 DIAGNOSIS — I6330 Cerebral infarction due to thrombosis of unspecified cerebral artery: Secondary | ICD-10-CM | POA: Diagnosis present

## 2012-05-01 DIAGNOSIS — I214 Non-ST elevation (NSTEMI) myocardial infarction: Secondary | ICD-10-CM | POA: Diagnosis present

## 2012-05-01 DIAGNOSIS — Z87891 Personal history of nicotine dependence: Secondary | ICD-10-CM

## 2012-05-01 DIAGNOSIS — I639 Cerebral infarction, unspecified: Secondary | ICD-10-CM | POA: Diagnosis present

## 2012-05-01 DIAGNOSIS — Z5189 Encounter for other specified aftercare: Secondary | ICD-10-CM

## 2012-05-01 DIAGNOSIS — R42 Dizziness and giddiness: Secondary | ICD-10-CM | POA: Diagnosis present

## 2012-05-01 DIAGNOSIS — R209 Unspecified disturbances of skin sensation: Secondary | ICD-10-CM | POA: Diagnosis present

## 2012-05-01 DIAGNOSIS — I161 Hypertensive emergency: Secondary | ICD-10-CM

## 2012-05-01 DIAGNOSIS — I2510 Atherosclerotic heart disease of native coronary artery without angina pectoris: Secondary | ICD-10-CM | POA: Diagnosis present

## 2012-05-01 DIAGNOSIS — E785 Hyperlipidemia, unspecified: Secondary | ICD-10-CM | POA: Diagnosis present

## 2012-05-01 DIAGNOSIS — G8190 Hemiplegia, unspecified affecting unspecified side: Secondary | ICD-10-CM | POA: Diagnosis present

## 2012-05-01 LAB — BASIC METABOLIC PANEL
BUN: 14 mg/dL (ref 6–23)
Calcium: 10 mg/dL (ref 8.4–10.5)
Creatinine, Ser: 1.13 mg/dL (ref 0.50–1.35)
GFR calc Af Amer: 78 mL/min — ABNORMAL LOW (ref >90)
GFR calc non Af Amer: 67 mL/min — ABNORMAL LOW (ref >90)
Potassium: 3.4 mEq/L — ABNORMAL LOW (ref 3.5–5.1)

## 2012-05-01 LAB — CBC
Hemoglobin: 14.6 g/dL (ref 13.0–17.0)
MCHC: 33.9 g/dL (ref 30.0–36.0)
Platelets: 166 10*3/uL (ref 150–400)
RDW: 14.2 % (ref 11.5–15.5)

## 2012-05-01 LAB — GLUCOSE, CAPILLARY: Glucose-Capillary: 228 mg/dL — ABNORMAL HIGH (ref 70–99)

## 2012-05-01 MED ORDER — BISACODYL 10 MG RE SUPP: 10.0000 mg | Freq: Every day | RECTAL | Status: DC | PRN

## 2012-05-01 MED ORDER — PROCHLORPERAZINE 25 MG RE SUPP
12.5000 mg | Freq: Four times a day (QID) | RECTAL | Status: DC | PRN
  Filled 2012-05-01: qty 1

## 2012-05-01 MED ORDER — CLOPIDOGREL BISULFATE 75 MG PO TABS
75.0000 mg | ORAL_TABLET | Freq: Every day | ORAL | Status: DC
  Administered 2012-05-02 – 2012-05-21 (×20): 75 mg via ORAL
  Filled 2012-05-01 (×22): qty 1

## 2012-05-01 MED ORDER — SENNOSIDES-DOCUSATE SODIUM 8.6-50 MG PO TABS: 1.0000 | ORAL_TABLET | Freq: Every evening | ORAL | Status: DC | PRN

## 2012-05-01 MED ORDER — INSULIN GLARGINE 100 UNIT/ML ~~LOC~~ SOLN: 10.0000 [IU] | Freq: Every day | SUBCUTANEOUS | Status: DC

## 2012-05-01 MED ORDER — FLEET ENEMA 7-19 GM/118ML RE ENEM: 1.0000 | ENEMA | Freq: Once | RECTAL | Status: AC | PRN

## 2012-05-01 MED ORDER — DIPHENHYDRAMINE HCL 12.5 MG/5ML PO ELIX: 12.5000 mg | ORAL_SOLUTION | Freq: Four times a day (QID) | ORAL | Status: DC | PRN

## 2012-05-01 MED ORDER — ATORVASTATIN CALCIUM 40 MG PO TABS
40.0000 mg | ORAL_TABLET | Freq: Every day | ORAL | Status: DC
  Administered 2012-05-01 – 2012-05-20 (×20): 40 mg via ORAL
  Filled 2012-05-01 (×21): qty 1

## 2012-05-01 MED ORDER — AMLODIPINE BESYLATE 5 MG PO TABS
5.0000 mg | ORAL_TABLET | Freq: Every day | ORAL | Status: DC
  Administered 2012-05-02 – 2012-05-07 (×6): 5 mg via ORAL
  Filled 2012-05-01 (×12): qty 1

## 2012-05-01 MED ORDER — LISINOPRIL 20 MG PO TABS
20.0000 mg | ORAL_TABLET | Freq: Every day | ORAL | Status: DC
  Administered 2012-05-02 – 2012-05-08 (×7): 20 mg via ORAL
  Filled 2012-05-01 (×9): qty 1

## 2012-05-01 MED ORDER — CARVEDILOL 12.5 MG PO TABS
12.5000 mg | ORAL_TABLET | Freq: Two times a day (BID) | ORAL | Status: DC
  Administered 2012-05-01 – 2012-05-08 (×13): 12.5 mg via ORAL
  Filled 2012-05-01 (×16): qty 1

## 2012-05-01 MED ORDER — ISOSORBIDE MONONITRATE ER 30 MG PO TB24
30.0000 mg | ORAL_TABLET | Freq: Every day | ORAL | Status: DC
  Administered 2012-05-02 – 2012-05-08 (×7): 30 mg via ORAL
  Filled 2012-05-01 (×10): qty 1

## 2012-05-01 MED ORDER — ALUM & MAG HYDROXIDE-SIMETH 200-200-20 MG/5ML PO SUSP: 30.0000 mL | ORAL | Status: DC | PRN

## 2012-05-01 MED ORDER — ASPIRIN 81 MG PO CHEW
81.0000 mg | CHEWABLE_TABLET | Freq: Every day | ORAL | Status: DC
  Administered 2012-05-02 – 2012-05-20 (×19): 81 mg via ORAL
  Filled 2012-05-01 (×19): qty 1

## 2012-05-01 MED ORDER — TRAZODONE HCL 50 MG PO TABS: 25.0000 mg | ORAL_TABLET | Freq: Every evening | ORAL | Status: DC | PRN

## 2012-05-01 MED ORDER — PROCHLORPERAZINE MALEATE 5 MG PO TABS
5.0000 mg | ORAL_TABLET | Freq: Four times a day (QID) | ORAL | Status: DC | PRN
  Filled 2012-05-01: qty 2

## 2012-05-01 MED ORDER — NITROGLYCERIN 0.4 MG SL SUBL: 0.4000 mg | SUBLINGUAL_TABLET | SUBLINGUAL | Status: DC | PRN

## 2012-05-01 MED ORDER — GUAIFENESIN-DM 100-10 MG/5ML PO SYRP: 5.0000 mL | ORAL_SOLUTION | Freq: Four times a day (QID) | ORAL | Status: DC | PRN

## 2012-05-01 MED ORDER — POTASSIUM CHLORIDE CRYS ER 20 MEQ PO TBCR
40.0000 meq | EXTENDED_RELEASE_TABLET | Freq: Once | ORAL | Status: AC
  Administered 2012-05-01: 40 meq via ORAL
  Filled 2012-05-01 (×2): qty 2

## 2012-05-01 MED ORDER — ACETAMINOPHEN 325 MG PO TABS
325.0000 mg | ORAL_TABLET | ORAL | Status: DC | PRN
  Administered 2012-05-06: 650 mg via ORAL
  Filled 2012-05-01 (×2): qty 2

## 2012-05-01 MED ORDER — METFORMIN HCL 500 MG PO TABS
500.0000 mg | ORAL_TABLET | Freq: Two times a day (BID) | ORAL | Status: DC
  Administered 2012-05-01 – 2012-05-02 (×2): 500 mg via ORAL
  Filled 2012-05-01 (×4): qty 1

## 2012-05-01 MED ORDER — INSULIN ASPART 100 UNIT/ML ~~LOC~~ SOLN
0.0000 [IU] | Freq: Three times a day (TID) | SUBCUTANEOUS | Status: DC
  Administered 2012-05-01: 3 [IU] via SUBCUTANEOUS
  Administered 2012-05-01: 5 [IU] via SUBCUTANEOUS

## 2012-05-01 MED ORDER — PROCHLORPERAZINE EDISYLATE 5 MG/ML IJ SOLN
5.0000 mg | Freq: Four times a day (QID) | INTRAMUSCULAR | Status: DC | PRN
  Filled 2012-05-01: qty 2

## 2012-05-01 MED ORDER — ENOXAPARIN SODIUM 40 MG/0.4ML ~~LOC~~ SOLN
40.0000 mg | SUBCUTANEOUS | Status: DC
  Administered 2012-05-01 – 2012-05-20 (×19): 40 mg via SUBCUTANEOUS
  Filled 2012-05-01 (×21): qty 0.4

## 2012-05-01 NOTE — Progress Notes (Signed)
Inpatient Diabetes Program Recommendations  AACE/ADA: New Consensus Statement on Inpatient Glycemic Control (2013)  Target Ranges:  Prepandial:   less than 140 mg/dL      Peak postprandial:   less than 180 mg/dL (1-2 hours)      Critically ill patients:  140 - 180 mg/dL   Reason for Visit: Results for DIANE, BUNDREN (MRN [id number]) as of 05/01/2012 15:50  Ref. Range 04/30/2012 16:20 04/30/2012 21:19 05/01/2012 06:57 05/01/2012 07:28 05/01/2012 11:10  Glucose-Capillary Latest Range: 70-99 mg/dL 160 (H) 162 (H)  194 (H) 221 (H)   Please discontinue moderate Novolog at bedtime.  Also consider adding Lantus 10 units daily.  A1C=8.7%.  Will follow.

## 2012-05-01 NOTE — Discharge Summary (Signed)
Physician Discharge Summary  [name] [id number] DOB: 1946-06-13 DOA: 04/28/2012  PCP: William Hamburger, MD  Admit date: 04/28/2012 Discharge date: 05/01/2012  Discharge Diagnoses:   Hypertensive emergency, malignant hypertension.   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   Disposition: Needs B-met to follow renal function.  Follow-up Information    Follow up with Forbes Cellar, MD. Schedule an appointment as soon as possible for a visit in 2 months. (stroke clinic)    Contact information:   [address], [facility] [phone]          Diet:Diabetic diet.  Wt Readings from Last 3 Encounters:  04/28/12 87.8 kg (193 lb 9 oz)  03/09/12 91.7 kg (202 lb 2.6 oz)  03/09/12 91.7 kg (202 lb 2.6 oz)    History of present illness:  66 yo with CAD (recent stent - 6/28 on ASA and Effient), poorly controlled hypertension and diabetes who presented to [facility] ED on 8/19 with dizziness and right sided weakness and numbness for about 24 hours. Found to by hypertensive. Head CT demonstrated acute left basal ganglia infarct.   Hospital Course:  1-Hypertensive emergency, Malignant HTN. H/o HTN, CAD (stent 6/28). Patient was admitted to ICU, he was started on IV labetalol for BP control. He was also continue on home medications. Lasix was on hold due to mild increase Cr at 1.3. Will resume lasix at discharge. He will need follow up B-met. Norvasc was added to his medication list.   2-Chronic renal insufficiency, likely secondary to hypertension.: need B-met to assess renal function now that he will be on lasix and lisinopril.   3-DM. Hyperglycemia. HB-A1c at 8.7.  I will add 10 units of lantus daily, resume  metformin at discharge. Follow renal function now that he will be on metformin.   4-Acute basal ganglia infarct: in the setting of uncontrolled BP and Diabetes. Patient was started on plavix and aspirin. He was on effient for recent CAD stent. Effient was discontinue due to acute stroke and plavix was started. This was discussed with patient cardiologist. CT Head 04/30/2012 Small area infarction in the posterior limb internal capsule on the left is unchanged. No hemorrhage or extension of infarction is identified. MRI Brain 04/28/2012 Acute subcentimeter infarct affects the posterior limb internal capsule on the left. Chronic changes as described. MRA Brain 04/28/2012 No flow limiting stenosis is observed. Intracranial atherosclerotic change as described.  2D Echocardiogram EF 30-35% with no source of embolus. Diffuse hypokinesis. Carotid Doppler Bilateral: intimal wall thickening CCA. Very mild soft plaque noted orign ICA. Right vertebral artery flow not insonated. Left vertebral artery flow is antegrade. EKG normal EKG, normal sinus rhythm.    Discharge Exam: Filed Vitals:   05/01/12 1014  BP: 148/99  Pulse: 86  Temp: 97.9 F (36.6 C)  Resp: 20   Filed Vitals:   05/01/12 0233 05/01/12 0534 05/01/12 0624 05/01/12 1014  BP: 170/93 182/107 165/94 148/99  Pulse: 78 90  86  Temp:  98 F (36.7 C)  97.9 F (36.6 C)  TempSrc:  Oral  Oral  Resp:  18  20  Height:      Weight:      SpO2:  97%  100%   General: No distress.  Cardiovascular: S1, S2 RRR Respiratory: CTA.  Neuro: right side hemiparesis.   Discharge Instructions   Medication List  As of 05/01/2012 12:03 PM   STOP taking these medications         prasugrel 10 MG Tabs         TAKE these medications         acetaminophen 325 MG tablet   Commonly known as: TYLENOL   Take 650 mg by mouth every 4 (four) hours as needed. For pain/headache      aspirin EC 81 MG tablet   Take 81 mg by mouth daily.      bd getting started take  home kit Misc   1 kit by Other route once.      carvedilol 12.5 MG tablet   Commonly known as: COREG   Take 12.5 mg by mouth 2 (two) times daily with a meal.      furosemide 20 MG tablet   Commonly known as: LASIX   Take 20 mg by mouth daily.      isosorbide mononitrate 30 MG 24 hr tablet   Commonly known as: IMDUR   Take 30 mg by mouth daily.      lisinopril 20 MG tablet   Commonly known as: PRINIVIL,ZESTRIL   Take 20 mg by mouth daily.      metFORMIN 500 MG tablet   Commonly known as: GLUCOPHAGE   Take 500 mg by mouth 2 (two) times daily with a meal.      nitroGLYCERIN 0.4 MG SL tablet   Commonly known as: NITROSTAT   Place 0.4 mg under the tongue every 5 (five) minutes as needed. For chest pain      simvastatin 80 MG tablet   Commonly known as: ZOCOR   Take 40 mg by mouth every evening.                        Lantus 10 units subq HS.               Plavix 75 mg plavix.               Norvasc 5 mg po daily.   The results of significant diagnostics from this hospitalization (including imaging, microbiology, ancillary and laboratory) are listed below for reference.    Significant Diagnostic Studies: Ct Head Wo Contrast  04/30/2012  *RADIOLOGY REPORT*  Clinical Data: Mental status change.  Stroke  CT HEAD WITHOUT CONTRAST  Technique:  Contiguous axial images were obtained from the base of the skull through the vertex without contrast.  Comparison: MRI 04/28/2012  Findings: Small area of acute infarction in the posterior limb internal capsule on the left, similar to the MRI.  Negative for hemorrhage.  No other areas of acute infarct.  Ventricle size is normal.  No mass lesion.  IMPRESSION: Small area infarction in the posterior limb internal capsule on the left is unchanged.  No hemorrhage or   extension of infarction is identified.   Original Report Authenticated By: Truett Perna, M.D.    Mr Brain Wo Contrast  04/28/2012  *RADIOLOGY REPORT*  Clinical Data:  Dizziness  beginning yesterday.  History of diabetes and hypertension.  MRI HEAD WITHOUT CONTRAST MRA HEAD WITHOUT CONTRAST  Technique:  Multiplanar, multiecho pulse sequences of the brain and surrounding structures were obtained without intravenous contrast. Angiographic images of the head were obtained using MRA technique without contrast.  Comparison:  08/09/2006 CT  MRI HEAD  Findings:  There is a subcentimeter  acute infarct affecting the posterior limb internal capsule on the left ( image 15 series 4). There is no associated hemorrhage or mass lesion.  There is no hydrocephalus or extra-axial fluid.  Mild atrophy is present.  Mild chronic microvascular ischemic change is noted in the periventricular and subcortical white matter.  Tiny focus chronic hemorrhage affects the left occipital lobe.  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  There is no midline shift.  The major intracranial vascular structures are patent.  Calvarium and skull base intact.  Upper cervical region unremarkable.  No acute sinus or mastoid disease. Negative orbits.  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  Chronic changes as described.  MRA HEAD  Findings: Mild nonstenotic irregularity left cavernous carotid. Focal 50% stenosis right cavernous ICA.  Shallow atheromatous outpouching inferior cavernous segment right ICA approximately 2 x 3 mm.  50% stenosis supraclinoid ICA bilaterally.  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  No flow limiting stenosis.  Left vertebral is the sole contributor to the basilar. Right vertebral poorly visualized distally, likely ending in PICA.  There is no proximal stenosis of the anterior, middle, or posterior cerebral arteries.  There is no cerebellar branch occlusion. There is mild irregularity of the distal MCA and PCA branches bilaterally.  No intracranial berry aneurysm  is seen.   IMPRESSION:  No flow limiting stenosis is observed.  Intracranial atherosclerotic change as described.   Original Report Authenticated By: Staci Righter, M.D.    Mr Mra Head/brain Wo Cm  04/28/2012  *RADIOLOGY REPORT*  Clinical Data:  Dizziness beginning yesterday.  History of diabetes and hypertension.  MRI HEAD WITHOUT CONTRAST MRA HEAD WITHOUT CONTRAST  Technique:  Multiplanar, multiecho pulse sequences of the brain and surrounding structures were obtained without intravenous contrast. Angiographic images of the head were obtained using MRA technique without contrast.  Comparison:  08/09/2006 CT  MRI HEAD  Findings:  There is a subcentimeter acute infarct affecting the posterior limb internal capsule on the left ( image 15 series 4). There is no associated hemorrhage or mass lesion.  There is no hydrocephalus or extra-axial fluid.  Mild atrophy is present.  Mild chronic microvascular ischemic change is noted in the periventricular and subcortical white matter.  Tiny focus chronic hemorrhage affects the left occipital lobe.  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  There is no midline shift.  The major intracranial vascular structures are patent.  Calvarium and skull base intact.  Upper cervical region unremarkable.  No acute sinus or mastoid disease. Negative orbits.  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  Chronic changes as described.  MRA HEAD  Findings: Mild nonstenotic irregularity left cavernous carotid. Focal 50% stenosis right cavernous ICA.  Shallow atheromatous outpouching inferior cavernous segment right ICA approximately 2 x 3 mm.  50% stenosis supraclinoid ICA bilaterally.  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  No flow limiting stenosis.  Left vertebral is the sole contributor to the basilar. Right vertebral poorly visualized distally, likely ending in PICA.  There is  no proximal stenosis of the anterior, middle, or posterior cerebral arteries.  There is no cerebellar branch occlusion. There is mild irregularity of the distal MCA and PCA branches bilaterally.  No intracranial berry aneurysm  is seen.  IMPRESSION:  No flow limiting stenosis is observed.  Intracranial atherosclerotic change as described.  Original Report Authenticated By: Staci Righter, M.D.     Microbiology: Recent Results (from the past 240 hour(s))  MRSA PCR SCREENING     Status: Normal   Collection Time   04/28/12 10:37 PM      Component Value Range Status Comment   MRSA by PCR NEGATIVE  NEGATIVE Final      Labs: Basic Metabolic Panel:  Lab [id number] 04/30/12 0655 04/28/12 1540 04/28/12 1320  NA 143 143 141 142  K 3.4* 3.3* -- --  CL 107 107 104 104  CO2 23 24 25 25   GLUCOSE 208* 162* 173* 180*  BUN 14 15 19 20   CREATININE 1.13 1.17 1.22 1.37*  CALCIUM 10.0 10.1 10.5 10.7*  MG -- -- -- --  PHOS -- -- -- --   Liver Function Tests:  Lab 04/28/12 1540  AST 12  ALT 12  ALKPHOS 66  BILITOT 0.4  PROT 8.3  ALBUMIN 4.4   CBC:  Lab 05/01/12 0657 04/30/12 0655 04/28/12 1320  WBC 7.5 6.8 9.7  NEUTROABS -- -- --  HGB 14.6 15.0 15.6  HCT 43.1 43.7 44.6  MCV 79.5 78.2 78.1  PLT 166 157 184   Cardiac Enzymes: No results found for this basename: CKTOTAL:5,CKMB:5,CKMBINDEX:5,TROPONINI:5 in the last 168 hours BNP: No components found with this basename: POCBNP:5 CBG:  Lab 05/01/12 1110 05/01/12 0728 04/30/12 2119 04/30/12 1620 04/30/12 1219  GLUCAP 221* 194* 162* 160* 216*    Time coordinating discharge: 35 minutes  Signed:  [facility] 05/01/2012, 12:03 PM

## 2012-05-01 NOTE — Progress Notes (Signed)
Physical Therapy Treatment Patient Details Name: [name] MRN: [id number] DOB: 03-09-1946 Today's Date: 05/01/2012 Time: [id number] PT Time Calculation (min): 18 min  PT Assessment / Plan / Recommendation Comments on Treatment Session  Patient progresssing with ambulatoin. possible CIR this afternoon    Follow Up Recommendations  Inpatient Rehab    Barriers to Discharge        Equipment Recommendations  Defer to next venue    Recommendations for Other Services    Frequency Min 4X/week   Plan Discharge plan remains appropriate;Frequency remains appropriate    Precautions / Restrictions Precautions Precautions: Fall   Pertinent Vitals/Pain     Mobility  Bed Mobility Bed Mobility: Not assessed Transfers Sit to Stand: 4: Min assist;With upper extremity assist;With armrests;From chair/3-in-1 Stand to Sit: 3: Mod assist;To chair/3-in-1;With upper extremity assist Details for Transfer Assistance: A to ensure balance and safety. Cues for safe technique and patient wants to pull up on Rw Ambulation/Gait Ambulation/Gait Assistance: 3: Mod assist (+1 with chair to follow) Ambulation Distance (Feet): 60 Feet (x2) Assistive device: Rolling walker Ambulation/Gait Assistance Details: Cues for positioning and LE placement while trying to have patient step on line of tile on the floor for R LE placement. A for balance as patient buckle several time losing balance towards right side.  Gait Pattern: Step-through pattern;Decreased stride length;Decreased weight shift to left;Right steppage    Exercises     PT Diagnosis:    PT Problem List:   PT Treatment Interventions:     PT Goals Acute Rehab PT Goals PT Goal: Stand to Sit - Progress: Progressing toward goal PT Transfer Goal: Bed to Chair/Chair to Bed - Progress: Progressing toward goal PT Goal: Ambulate - Progress: Progressing toward goal  Visit Information  Last PT Received On: 05/01/12 Assistance Needed: +2      Subjective Data      Cognition  Overall Cognitive Status: Appears within functional limits for tasks assessed/performed Arousal/Alertness: Awake/alert Orientation Level: Appears intact for tasks assessed Behavior During Session: [facility] for tasks performed    Balance     End of Session PT - End of Session Equipment Utilized During Treatment: Gait belt Activity Tolerance: Patient tolerated treatment well Patient left: in chair;with call bell/phone within reach Nurse Communication: Mobility status   GP     Jacqualyn Posey 05/01/2012, 3:08 PM 05/01/2012 Jacqualyn Posey PTA [phone] pager [phone] office

## 2012-05-01 NOTE — Plan of Care (Addendum)
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  Diabetes mellitus  .  Arthritis  .  Hypertension  .  Hypertensive crisis  03/07/2012  .  Unstable angina  03/07/2012  .  DM (diabetes mellitus),poorly controlled  03/07/2012  .  CAD (coronary artery disease), with CABG in 2009 after an MI  03/07/2012  .  Myocardial infarction    Functional Problem List  Patient demonstrates impairments in the following areas: Balance, Cognition, Medication Management, Motor and Nutrition  Basic ADL's: eating, grooming, bathing, dressing and toileting Advanced ADL's: simple meal preparation  Transfers:  bed mobility, bed to chair, toilet, tub/shower, car and floor Locomotion:  ambulation, wheelchair mobility and stairs  Additional Impairments:  None, Communication  expression and Social Cognition   problem solving  Anticipated Outcomes Item Anticipated Outcome  Eating/Swallowing    Basic self-care    Tolieting  Min assist  Bowel/Bladder  Continent bowel/bladder, mod independent  Transfers  Mod I  Locomotion  Mod I household, supervision community  Communication  Mod I  Cognition  Mod I  Pain  Pain </= 3  Safety/Judgment  Mod I  Other  Skin: no new breakdown   Therapy Plan: PT Frequency: 2-3 X/day, 60-90 minutes;5 out of 7 days OT Frequency: 1-2 X/day, 60-90 minutes SLP Frequency: 1-2 X/day, 30-60 minutes   Team Interventions: Item RN PT OT SLP SW TR Other  Self Care/Advanced ADL Retraining         Neuromuscular Re-Education  x       Therapeutic Activities  x  x  x   UE/LE Strength Training/ROM  x    x   UE/LE Coordination Activities  x    x   Visual/Perceptual Remediation/Compensation         DME/Adaptive Equipment Instruction  x    x   Therapeutic  Exercise  x    x   Balance/Vestibular Training  x    x   Patient/Family Education x x  x  x   Cognitive Remediation/Compensation    x     Functional Mobility Training  x    x   Ambulation/Gait Training  x       IT trainer  x       Wheelchair Propulsion/Positioning  x       Functional Printmaker  x       Community Reintegration  x    x   Dysphagia/Aspiration Designer, multimedia Facilitation    x     Bladder Management x        Bowel Management x        Disease Management/Prevention x        Pain Management x        Medication Management x        Skin Care/Wound Management x        Splinting/Orthotics  x       Discharge Planning  x  x  x   Psychosocial Support    x  x                      Team Discharge Planning: Destination:  Home Projected Follow-up:  PT, OT and Outpatient Projected Equipment Needs:  Tub Cornelius Moras  and AFO Patient/family involved in discharge planning:  Yes  MD ELOS: 3 weeks Medical Rehab Prognosis:  Good Assessment: 66 yo male with uncontrolled HTN admitted with R HP, now requiring 24/7 rehab RN and MD, CIR level PT,OT SLP

## 2012-05-01 NOTE — H&P (Signed)
Physical Medicine and Rehabilitation Admission H&P  Chief Complaint   Patient presents with   .  Dizziness   .  Hypertension   .  Numbness   :  HPI: [name] is a 66 y.o. RH-male with history of poorly controlled DM, CAD with stent 03/07/12; admitted on 08/19 with complaints dizziness and right sided weakness. MRI/MRA brain done revealing acute infarct left internal capsule, remote lacunae affecting bilateral brainstem and thalami, no flow limiting stenosis. 2D echo done revealing EF 30% with diffuse hypokinesis and moderate hypokinesis. Carotid dopplers with intimal wall thickening CCA. Patient's Effient changed to Plavix per input with cardiology. Had worsening of right sided weakness past admission and follow up CCT without evidence of hemorrhage or extension. Therapies initiated and CIR recommended for progressive therapies.  Review of Systems  HENT: Negative for hearing loss.  Eyes: Negative for blurred vision and double vision.  Respiratory: Negative for cough and shortness of breath.  Cardiovascular: Negative for chest pain and palpitations.  Gastrointestinal: Negative for heartburn, nausea and constipation.  Genitourinary: Negative for dysuria and urgency.  Musculoskeletal: Negative for myalgias and back pain.  Neurological: Positive for speech change, focal weakness and weakness. Negative for dizziness and headaches.  Psychiatric/Behavioral: Negative for depression.   Past Medical History   Diagnosis  Date   .  Coronary artery disease    .  Diabetes mellitus    .  Arthritis    .  Hypertension    .  Hypertensive crisis  03/07/2012   .  Unstable angina  03/07/2012   .  DM (diabetes mellitus),poorly controlled  03/07/2012   .  CAD (coronary artery disease), with CABG in 2009 after an MI  03/07/2012   .  Myocardial infarction     Past Surgical History   Procedure  Date   .  Cardiac surgery    .  Coronary artery bypass graft    .  Cardiac catheterization     History  reviewed. No pertinent family history.  Social History: reports that he quit smoking about 30 years ago. He has never used smokeless tobacco. He reports that he drinks about .6 ounces of alcohol per week. He reports that he does not use illicit drugs.  Allergies: No Known Allergies  Scheduled Meds:  .  amLODipine  5 mg  Oral  Daily   .  aspirin  81 mg  Oral  Daily   .  atorvastatin  40 mg  Oral  q1800   .  carvedilol  12.5 mg  Oral  BID WC   .  clopidogrel  75 mg  Oral  Q breakfast   .  insulin aspart  0-15 Units  Subcutaneous  TID PC & HS   .  insulin glargine  10 Units  Subcutaneous  QHS   .  isosorbide mononitrate  30 mg  Oral  Daily   .  lisinopril  20 mg  Oral  Daily   .  potassium chloride  40 mEq  Oral  Once   .  white petrolatum      .  DISCONTD: insulin aspart  0-15 Units  Subcutaneous  Q4H    Medications Prior to Admission   Medication  Sig  Dispense  Refill   .  acetaminophen (TYLENOL) 325 MG tablet  Take 650 mg by mouth every 4 (four) hours as needed. For pain/headache     .  aspirin EC 81 MG tablet  Take 81 mg by mouth  daily.     .  bd getting started take home kit MISC  1 kit by Other route once.     .  carvedilol (COREG) 12.5 MG tablet  Take 12.5 mg by mouth 2 (two) times daily with a meal.     .  furosemide (LASIX) 20 MG tablet  Take 20 mg by mouth daily.     .  isosorbide mononitrate (IMDUR) 30 MG 24 hr tablet  Take 30 mg by mouth daily.     Marland Kitchen  lisinopril (PRINIVIL,ZESTRIL) 20 MG tablet  Take 20 mg by mouth daily.     .  metFORMIN (GLUCOPHAGE) 500 MG tablet  Take 500 mg by mouth 2 (two) times daily with a meal.     .  simvastatin (ZOCOR) 80 MG tablet  Take 40 mg by mouth every evening.     Marland Kitchen  DISCONTD: prasugrel (EFFIENT) 10 MG TABS  Take 10 mg by mouth daily.     .  nitroGLYCERIN (NITROSTAT) 0.4 MG SL tablet  Place 0.4 mg under the tongue every 5 (five) minutes as needed. 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[employer])  Functional Status:  Mobility:  Bed Mobility  Bed Mobility: Not assessed (in chair on arrival)  Supine to Sit: 5: Supervision  Sitting - Scoot to [employer] of Bed: 5: Supervision  Transfers  Transfers: Sit to Stand;Stand to [employer] to Stand: 1: +2 Total assist;With upper extremity assist;From chair/3-in-1 (facilitation of weight shifting, proper alignment)  Sit to Stand: Patient Percentage: 80% (facilitation of trunk activation, Rt hand placement )  Stand to Sit: 1: +2 Total assist;With upper extremity assist;To chair/3-in-1  Stand to Sit: Patient Percentage: 80%  Ambulation/Gait  Ambulation/Gait Assistance: 1: +2 Total assist  Ambulation/Gait: Patient Percentage: 60%  Ambulation Distance (Feet): 75 Feet  Assistive device: Rolling walker  Ambulation/Gait Assistance Details: ataxic gait R LE/ adducted placement at foot contact; moderately heavy list to the R, needing w/shift A Left  Gait Pattern: Step-through pattern;Decreased step length - right;Decreased step length - left;Decreased stride length;Decreased weight shift to left;Shuffle;Ataxic  Stairs: No  Wheelchair Mobility  Wheelchair Mobility: No  ADL:  ADL  Eating/Feeding: Simulated;Maximal assistance (hand over hand needed)  Where Assessed - Eating/Feeding: Chair  Grooming: Simulated;Wash/dry hands;Wash/dry face;Maximal assistance [facility])  Where Assessed - Grooming: Supported sitting  Upper Body Bathing: Simulated;Chest;Right arm;Left  arm;Abdomen;Maximal assistance (HOH due to Rt arm decreased ROM and strength)  Where Assessed - Upper Body Bathing: Supported sitting  Toilet Transfer: Simulated;+2 Total assistance  Toilet Transfer Method: Development worker, international aid: Raised toilet seat with arms (or 3-in-1 over toilet)  Equipment Used: Rolling walker;Gait belt (HOH initially for Rt hand placement on RW)  Transfers/Ambulation Related to ADLs: Pt ambulated ~75 feet (see PT note for details) with total +2 (A) for facilitation of weight shifting, pt leaning laterally on Rt side, (A) to facilitate Rt LE gait length/ stride. Pt with difficulty  clearing Rt LE toes with fatigue and also demonstrates narrowed base of support. Pt with facilitation of shoulder width apart step pattern. Pt required stopping x2 to correct posture, rest from neuromuscular fatigue and hand placement. Pt tolerated exercise and mobility excellent demonstrating improvement remains CIR candidate.  ADL Comments: Pt demonstrates Rt side paresis. Pt with proprioception deficits, decreased supination/ pronation, 3- out 5 wrist extension, elbow flexion / extension AROM increased with associated reaction (RT and LT UE at same time) Pt demonstrated better return with RT UE only after associated reaction neuromuscular education. Pt AROM shoulder flexion ~70 degrees and pt using compensatory methods to reach 90 degrees. Pt grip strength 3- out 5 (very loose grasp). Pt able to activate all aspects of Rt UE. Pt demonstrated tricep 4 out 5 taking resistance with associated testing RT UE.  Cognition:  Cognition  Overall Cognitive Status: Appears within functional limits for tasks assessed  Arousal/Alertness: Awake/alert  Orientation Level: Oriented X4  Safety/Judgment: Appears intact  Cognition  Overall Cognitive Status: Appears within functional limits for tasks assessed/performed  Arousal/Alertness: Awake/alert  Orientation Level: Appears intact for tasks assessed    Behavior During Session: [facility] for tasks performed  Blood pressure 148/99, pulse 86, temperature 97.9 F (36.6 C), temperature source Oral, resp. rate 20, height 5\' 8"  (1.727 m), weight 87.8 kg (193 lb 9 oz), SpO2 100.00%.  Physical Exam  Nursing note and vitals reviewed.  Constitutional: He is oriented to person, place, and time. He appears well-developed and well-nourished.  HENT:  Head: Normocephalic and atraumatic.  Eyes: Pupils are equal, round, and reactive to light.  Neck: Normal range of motion. Neck supple.  Cardiovascular: Normal rate and regular rhythm. No murmurs or gallops Pulmonary/Chest: Effort normal and breath sounds normal. No rales or wheezes, no distress Abdominal: Soft. Bowel sounds are normal. Slightly distended Musculoskeletal: Normal range of motion. He exhibits no edema.  Neurological: He is alert and oriented to person, place, and time.  Speech with minimal dysarthria. Mild right facial droop. RUE is 2-3/5. RLE is 2/5 prox to 1-2 distally. Sensation is 1/2 right arm, leg. DTR's are 3+ on right. Right central 7 and 12. Cognitively the patient is intact.  Skin: Skin is warm and dry.   Results for orders placed during the hospital encounter of 04/28/12 (from the past 48 hour(s))   GLUCOSE, CAPILLARY Status: Abnormal    Collection Time    04/29/12 12:22 PM   Component  Value  Range  Comment    Glucose-Capillary  160 (*)  70 - 99 mg/dL    GLUCOSE, CAPILLARY Status: Abnormal    Collection Time    04/29/12 3:48 PM   Component  Value  Range  Comment    Glucose-Capillary  248 (*)  70 - 99 mg/dL     Comment 1  Notify RN      Comment 2  Documented in Chart     GLUCOSE, CAPILLARY Status: Abnormal    Collection Time    04/29/12 7:48 PM   Component  Value  Range  Comment    Glucose-Capillary  178 (*)  70 - 99 mg/dL     Comment 1  Notify RN      Comment 2  Documented in Chart     GLUCOSE, CAPILLARY Status: Abnormal    Collection Time    04/30/12 12:22 AM   Component   Value  Range  Comment    Glucose-Capillary  128 (*)  70 - 99 mg/dL  GLUCOSE, CAPILLARY Status: Abnormal    Collection Time    04/30/12 3:51 AM   Component  Value  Range  Comment    Glucose-Capillary  151 (*)  70 - 99 mg/dL    BASIC METABOLIC PANEL Status: Abnormal    Collection Time    04/30/12 6:55 AM   Component  Value  Range  Comment    Sodium  143  135 - 145 mEq/L     Potassium  3.3 (*)  3.5 - 5.1 mEq/L     Chloride  107  96 - 112 mEq/L     CO2  24  19 - 32 mEq/L     Glucose, Bld  162 (*)  70 - 99 mg/dL     BUN  15  6 - 23 mg/dL     Creatinine, Ser  1.17  0.50 - 1.35 mg/dL     Calcium  10.1  8.4 - 10.5 mg/dL     GFR calc non Af Amer  65 (*)  >90 mL/min     GFR calc Af Amer  75 (*)  >90 mL/min    CBC Status: Normal    Collection Time    04/30/12 6:55 AM   Component  Value  Range  Comment    WBC  6.8  4.0 - 10.5 K/uL     RBC  5.59  4.22 - 5.81 MIL/uL     Hemoglobin  15.0  13.0 - 17.0 g/dL     HCT  43.7  39.0 - 52.0 %     MCV  78.2  78.0 - 100.0 fL     MCH  26.8  26.0 - 34.0 pg     MCHC  34.3  30.0 - 36.0 g/dL     RDW  13.9  11.5 - 15.5 %     Platelets  157  150 - 400 K/uL    GLUCOSE, CAPILLARY Status: Abnormal    Collection Time    04/30/12 8:23 AM   Component  Value  Range  Comment    Glucose-Capillary  167 (*)  70 - 99 mg/dL    GLUCOSE, CAPILLARY Status: Abnormal    Collection Time    04/30/12 12:19 PM   Component  Value  Range  Comment    Glucose-Capillary  216 (*)  70 - 99 mg/dL    GLUCOSE, CAPILLARY Status: Abnormal    Collection Time    04/30/12 4:20 PM   Component  Value  Range  Comment    Glucose-Capillary  160 (*)  70 - 99 mg/dL    GLUCOSE, CAPILLARY Status: Abnormal    Collection Time    04/30/12 9:19 PM   Component  Value  Range  Comment    Glucose-Capillary  162 (*)  70 - 99 mg/dL     Comment 1  Notify RN      Comment 2  Documented in Chart     BASIC METABOLIC PANEL Status: Abnormal    Collection Time    05/01/12 6:57 AM   Component  Value  Range   Comment    Sodium  143  135 - 145 mEq/L     Potassium  3.4 (*)  3.5 - 5.1 mEq/L     Chloride  107  96 - 112 mEq/L     CO2  23  19 - 32 mEq/L     Glucose, Bld  208 (*)  70 - 99 mg/dL     BUN  14  6 - 23 mg/dL     Creatinine, Ser  1.13  0.50 - 1.35 mg/dL     Calcium  10.0  8.4 - 10.5 mg/dL     GFR calc non Af Amer  67 (*)  >90 mL/min     GFR calc Af Amer  78 (*)  >90 mL/min    CBC Status: Normal    Collection Time    05/01/12 6:57 AM   Component  Value  Range  Comment    WBC  7.5  4.0 - 10.5 K/uL     RBC  5.42  4.22 - 5.81 MIL/uL     Hemoglobin  14.6  13.0 - 17.0 g/dL     HCT  43.1  39.0 - 52.0 %     MCV  79.5  78.0 - 100.0 fL     MCH  26.9  26.0 - 34.0 pg     MCHC  33.9  30.0 - 36.0 g/dL     RDW  14.2  11.5 - 15.5 %     Platelets  166  150 - 400 K/uL    GLUCOSE, CAPILLARY Status: Abnormal    Collection Time    05/01/12 7:28 AM   Component  Value  Range  Comment    Glucose-Capillary  194 (*)  70 - 99 mg/dL     Comment 1  Documented in Chart      Comment 2  Notify RN     GLUCOSE, CAPILLARY Status: Abnormal    Collection Time    05/01/12 11:10 AM   Component  Value  Range  Comment    Glucose-Capillary  221 (*)  70 - 99 mg/dL     Comment 1  Documented in Chart      Comment 2  Notify RN      Ct Head Wo Contrast  04/30/2012 *RADIOLOGY REPORT* Clinical Data: Mental status change. Stroke CT HEAD WITHOUT CONTRAST Technique: Contiguous axial images were obtained from the base of the skull through the vertex without contrast. Comparison: MRI 04/28/2012 Findings: Small area of acute infarction in the posterior limb internal capsule on the left, similar to the MRI. Negative for hemorrhage. No other areas of acute infarct. Ventricle size is normal. No mass lesion. IMPRESSION: Small area infarction in the posterior limb internal capsule on the left is unchanged. No hemorrhage or extension of infarction is identified. Original Report Authenticated By: Truett Perna, M.D.   Post Admission  Physician Evaluation:  1. 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 2. Patient is admitted to receive collaborative, interdisciplinary care between the physiatrist, rehab nursing staff, and therapy team. 3. Patient's level of medical complexity and substantial therapy needs in context of that medical necessity cannot be provided at a lesser intensity of care such as a SNF. 4. Patient has experienced substantial functional loss from his/her baseline which was documented above under the "Functional History" and "Functional Status" headings. Judging by the patient's diagnosis, physical exam, and functional history, the patient has potential for functional progress which will result in measurable gains while on inpatient rehab. These gains will be of substantial and practical use upon discharge in facilitating mobility and self-care at the household level. 5. Physiatrist will provide 24 hour management of medical needs as well as oversight of the therapy plan/treatment and provide guidance as appropriate regarding the interaction of the two. 6. 24 hour rehab nursing will assist with bladder management, bowel management, safety, skin/wound care,  disease management, medication administration, pain management and patient education and help integrate therapy concepts, techniques,education, etc. 7. PT will assess and treat for: fxnl mobility, lower ext strength, NMR, balance, adaptive equipment. Goals are: mod I to supervision. 8. OT will assess and treat for: UES, fxnl mobility, ADL's, balance, safety. Goals are: mod I to min assist. 9. SLP will assess and treat for: speech intelligiblity and communication. Goals are: mod I. 10. Case Management and Social Worker will assess and treat for psychological issues and discharge planning. 11. Team conference will be held weekly to assess progress toward goals and to determine barriers to discharge. 12. Patient  will receive at least 3 hours of therapy per day at least 5 days per week. 13. ELOS: 10-14 days Prognosis: excellent Medical Problem List and Plan:  1. DVT Prophylaxis/Anticoagulation: Pharmaceutical: Lovenox  2. Pain Management: N/A  3. Mood: seems appropriate. Will have LCSW follow for formal evaluation.  4. Neuropsych: This patient is capable of making decisions on his/her own behalf.  5.HTN: monitor with bid checks. ContinueImdur, Prinivil, norvac and coreg.  6. DM type 2: monitor with bid checks. Resume metformin as studies without contrast and discontinue lantus. SSI for elevated BS.  7. Hypokalemia: ?dilutional. Recheck in am.  8. Dyslipidemia: continue Lipitor.  9. CAD: continue Lipitor, coreg, Imdur and Prinivil. Now on ASA and plavix due to recent stent/CVA  Oval Linsey, MD

## 2012-05-01 NOTE — Progress Notes (Signed)
Pt BP elevated to 187/110. Per order, labetalol given. BP decreased to 170/93. Will cont to monitor

## 2012-05-01 NOTE — Progress Notes (Signed)
Patient admitted to [id number] at 1350. Patient's wife accompanied patient. Report received from [place], [place] on [employer]. Patient and wife oriented to unit, rehab schedule, call bell system, and safety plan. Patient and wife verbalized understanding. Alease Medina

## 2012-05-01 NOTE — Progress Notes (Signed)
I discussed with Dr. Tyrell Antonio and patient. Plan to admit patient to inpt rehab today. Please call for any questions. [phone]

## 2012-05-02 ENCOUNTER — Inpatient Hospital Stay (HOSPITAL_COMMUNITY): Payer: Medicaid Other

## 2012-05-02 ENCOUNTER — Inpatient Hospital Stay (HOSPITAL_COMMUNITY): Payer: Self-pay | Admitting: Speech Pathology

## 2012-05-02 ENCOUNTER — Inpatient Hospital Stay (HOSPITAL_COMMUNITY): Payer: Medicaid Other | Admitting: Occupational Therapy

## 2012-05-02 ENCOUNTER — Inpatient Hospital Stay (HOSPITAL_COMMUNITY): Payer: Self-pay | Admitting: Physical Therapy

## 2012-05-02 ENCOUNTER — Inpatient Hospital Stay (HOSPITAL_COMMUNITY): Payer: Medicaid Other | Admitting: Physical Therapy

## 2012-05-02 DIAGNOSIS — I6330 Cerebral infarction due to thrombosis of unspecified cerebral artery: Secondary | ICD-10-CM

## 2012-05-02 DIAGNOSIS — Z5189 Encounter for other specified aftercare: Secondary | ICD-10-CM

## 2012-05-02 DIAGNOSIS — R29810 Facial weakness: Secondary | ICD-10-CM

## 2012-05-02 DIAGNOSIS — I10 Essential (primary) hypertension: Secondary | ICD-10-CM

## 2012-05-02 LAB — CBC WITH DIFFERENTIAL/PLATELET
Basophils Absolute: 0 10*3/uL (ref 0.0–0.1)
Basophils Relative: 1 % (ref 0–1)
Lymphocytes Relative: 20 % (ref 12–46)
MCHC: 34.6 g/dL (ref 30.0–36.0)
Neutro Abs: 5.9 10*3/uL (ref 1.7–7.7)
Neutrophils Relative %: 69 % (ref 43–77)
Platelets: 172 10*3/uL (ref 150–400)
RDW: 14 % (ref 11.5–15.5)
WBC: 8.5 10*3/uL (ref 4.0–10.5)

## 2012-05-02 LAB — GLUCOSE, CAPILLARY
Glucose-Capillary: 184 mg/dL — ABNORMAL HIGH (ref 70–99)
Glucose-Capillary: 199 mg/dL — ABNORMAL HIGH (ref 70–99)
Glucose-Capillary: 148 mg/dL — ABNORMAL HIGH (ref 70–99)

## 2012-05-02 LAB — COMPREHENSIVE METABOLIC PANEL
ALT: 15 U/L (ref 0–53)
AST: 13 U/L (ref 0–37)
Albumin: 4.1 g/dL (ref 3.5–5.2)
CO2: 24 mEq/L (ref 19–32)
Chloride: 104 mEq/L (ref 96–112)
GFR calc non Af Amer: 73 mL/min — ABNORMAL LOW (ref >90)
Sodium: 140 mEq/L (ref 135–145)
Total Bilirubin: 0.5 mg/dL (ref 0.3–1.2)

## 2012-05-02 IMAGING — CT CT ANGIO HEAD
1 series · 12 of 18 positions shown · IV contrast (CONTRAST)
Comparison: Most recent CT head 04/30/2012.Also MRI and MRA from
04/28/2012.

CTA NECK

CLINICAL DATA: Acute onset of worsening weakness.

CT ANGIOGRAPHY HEAD AND NECK
TECHNIQUE: Multidetector CT imaging of the head and neck was
performed using the standard protocol during bolus administration
of intravenous contrast.  Multiplanar CT image reconstructions
including MIPs were obtained to evaluate the vascular anatomy.
Carotid stenosis measurements (when applicable) are obtained
utilizing NASCET criteria, using the distal internal carotid
diameter as the denominator.
Contrast: 50mL OMNIPAQUE IOHEXOL 350 MG/ML SOLN

[mpr coronals · coronal · 0.64mm/px · 12 of 172 slices shown]
[im 14/172  soft-tissue]
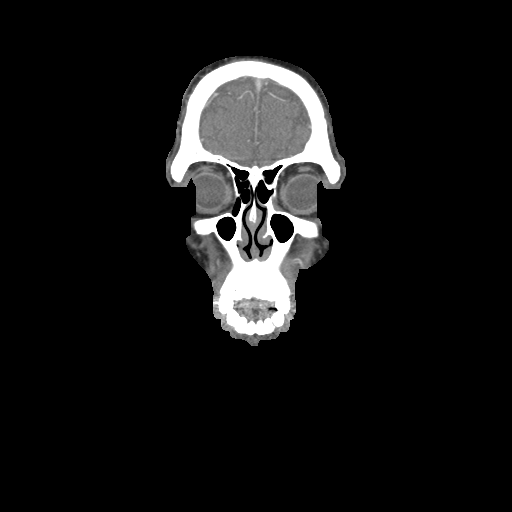
[im 27/172  bone]
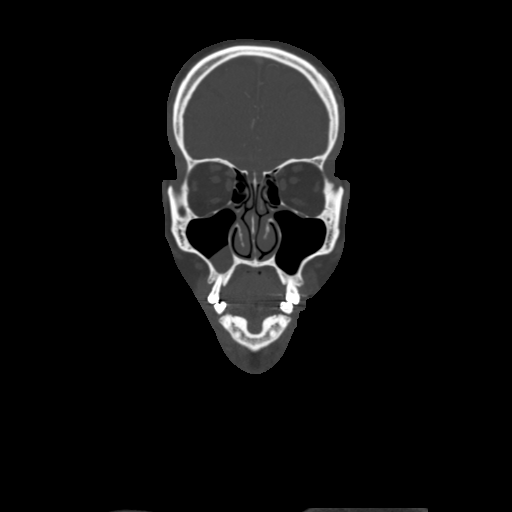
[im 40/172  soft-tissue]
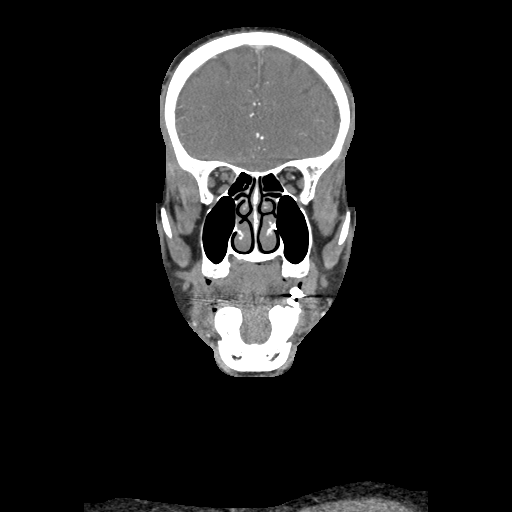
[im 53/172  bone]
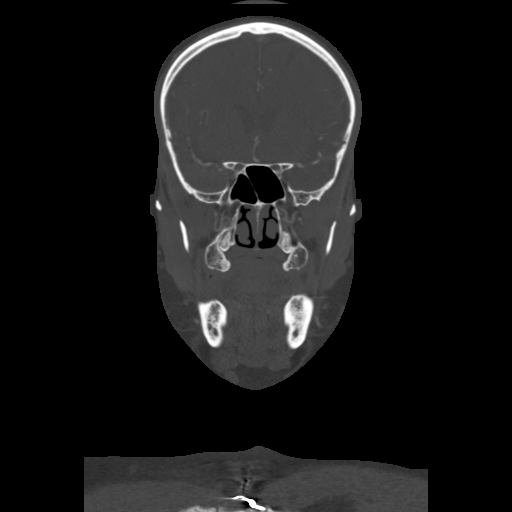
[im 66/172  soft-tissue]
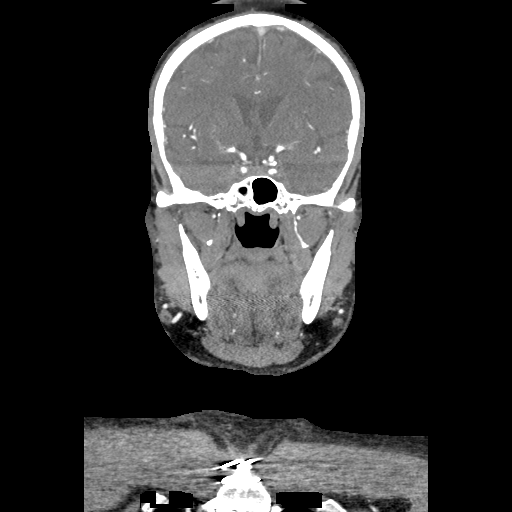
[im 79/172  bone]
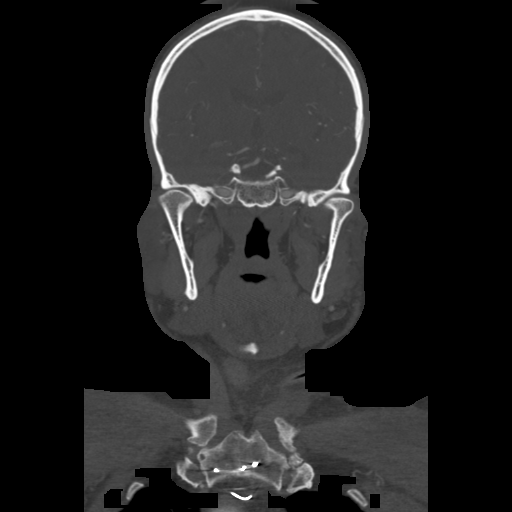
[im 93/172  soft-tissue]
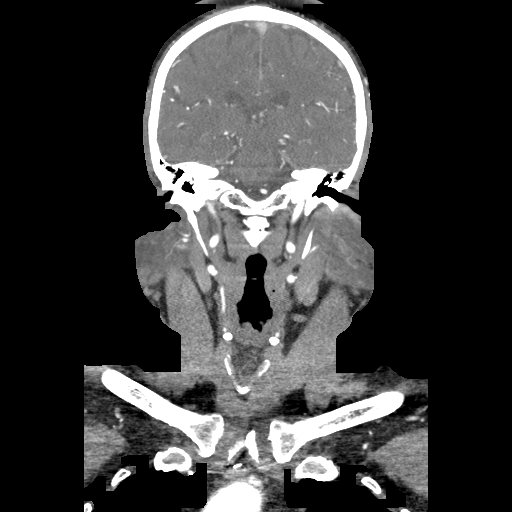
[im 106/172  bone]
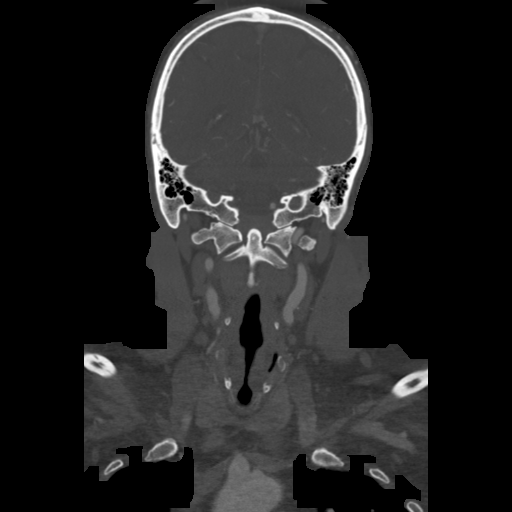
[im 119/172  soft-tissue]
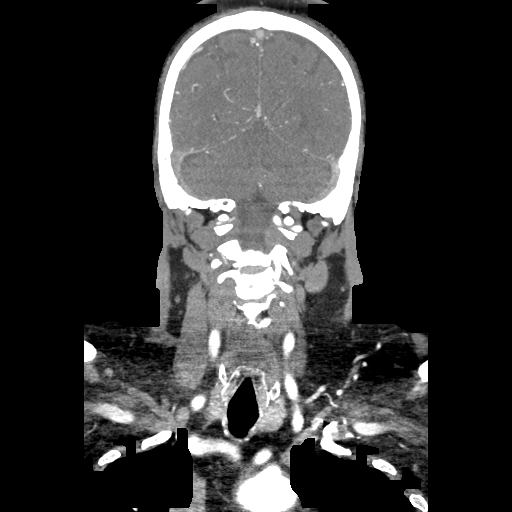
[im 132/172  bone]
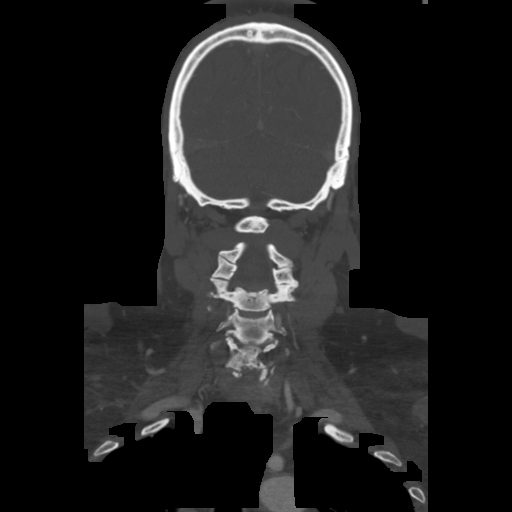
[im 145/172  soft-tissue]
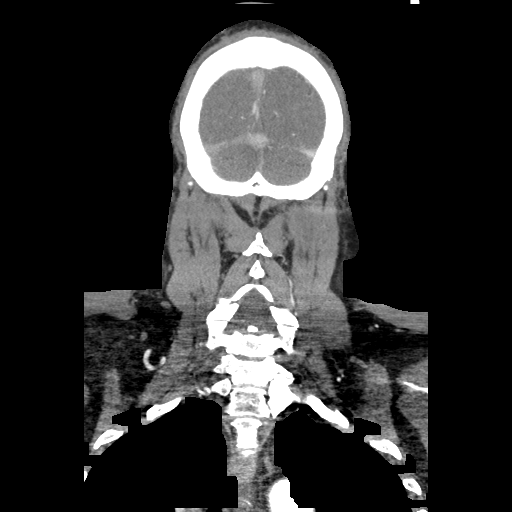
[im 158/172  bone]
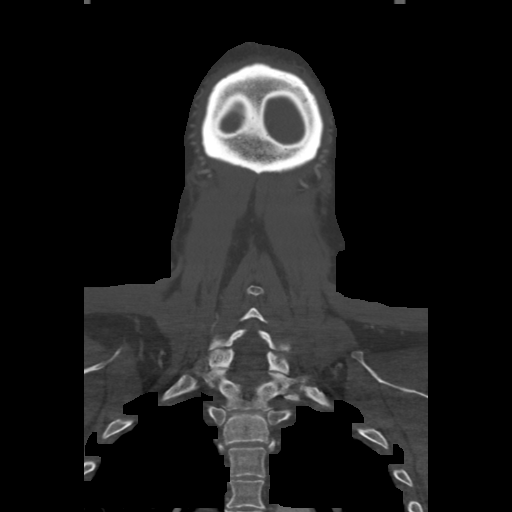

[12 of 18 positions shown; findings below may reference images not displayed]

FINDINGS: Conventional branching of the great vessels from the
arch.  No proximal stenosis or vascular occlusion. Mild nonstenotic
atheromatous change at the internal carotid arteries bifurcations
bilaterally.  Left vertebral dominant.  No neck masses.  Clear lung
apices.  Prior CABG.

 Review of the MIP images confirms the above findings.
IMPRESSION: There is no flow limiting stenosis or occlusion of the carotid
arteries.  Continued dominant left vertebral with patent but
chronically small right vertebral.

CTA HEAD
FINDINGS: Well demarcated hypodensity posterior limb internal
capsule on the left correspond to the area of previously identified
acute infarction.  Compared to the area of restricted diffusion on
04/27/2012, the area of hypodensity is increased (6 x 10 mm).  No
other areas of acute infarction are definitely identified.

There is no supraclinoid internal carotid artery flow limiting
stenosis or occlusion. Bilateral 50-75% stenoses of the
supraclinoid internal carotid arteries are redemonstrated, slightly
greater on the right.  Shallow outpouching right internal carotid
artery redemonstrated, sessile atheromatous change.  No
intracranial berry aneurysm.   The basilar artery is widely patent
with left vertebral dominant.  There is no intracranial flow
limiting stenosis of the anterior, middle, or posterior cerebral
arteries.  No intracranial berry aneurysm is seen.

 Review of the MIP images confirms the above findings.
IMPRESSION: The previously identified infarct of the posterior limb internal
capsule on the left appears slightly larger than that seen on MR 6
x 10 mm.   Correlate clinically.  No areas of acute infarction are
seen remote from the primary known insult, and no areas of
intracranial vascular occlusion or significant flow limiting
stenosis.

## 2012-05-02 MED ORDER — CLONIDINE HCL 0.1 MG PO TABS
0.1000 mg | ORAL_TABLET | Freq: Four times a day (QID) | ORAL | Status: DC | PRN
  Administered 2012-05-02 – 2012-05-04 (×2): 0.1 mg via ORAL
  Filled 2012-05-02: qty 1

## 2012-05-02 MED ORDER — METFORMIN HCL 850 MG PO TABS
850.0000 mg | ORAL_TABLET | Freq: Two times a day (BID) | ORAL | Status: DC
  Administered 2012-05-02 – 2012-05-21 (×38): 850 mg via ORAL
  Filled 2012-05-02 (×41): qty 1

## 2012-05-02 MED ORDER — INSULIN ASPART 100 UNIT/ML ~~LOC~~ SOLN: 0.0000 [IU] | Freq: Every day | SUBCUTANEOUS | Status: DC

## 2012-05-02 MED ORDER — INSULIN ASPART 100 UNIT/ML ~~LOC~~ SOLN
3.0000 [IU] | Freq: Three times a day (TID) | SUBCUTANEOUS | Status: DC
  Administered 2012-05-02 – 2012-05-21 (×55): 3 [IU] via SUBCUTANEOUS

## 2012-05-02 MED ORDER — LISINOPRIL 10 MG PO TABS
10.0000 mg | ORAL_TABLET | Freq: Every day | ORAL | Status: DC
  Administered 2012-05-03 – 2012-05-14 (×12): 10 mg via ORAL
  Filled 2012-05-02 (×14): qty 1

## 2012-05-02 MED ORDER — CLONIDINE HCL 0.1 MG PO TABS: 0.1000 mg | ORAL_TABLET | Freq: Four times a day (QID) | ORAL | Status: DC | PRN

## 2012-05-02 MED ORDER — LISINOPRIL 5 MG PO TABS
5.0000 mg | ORAL_TABLET | Freq: Every day | ORAL | Status: DC
  Filled 2012-05-02: qty 1

## 2012-05-02 MED ORDER — SODIUM CHLORIDE 0.9 % IV SOLN
INTRAVENOUS | Status: DC
  Administered 2012-05-02 – 2012-05-03 (×2): via INTRAVENOUS

## 2012-05-02 MED ORDER — IOHEXOL 350 MG/ML SOLN
50.0000 mL | Freq: Once | INTRAVENOUS | Status: AC | PRN
  Administered 2012-05-02: 50 mL via INTRAVENOUS

## 2012-05-02 MED ORDER — INSULIN ASPART 100 UNIT/ML ~~LOC~~ SOLN
0.0000 [IU] | Freq: Three times a day (TID) | SUBCUTANEOUS | Status: DC
Start: 2012-05-02 — End: 2012-05-21
  Administered 2012-05-02: 3 [IU] via SUBCUTANEOUS
  Administered 2012-05-02 – 2012-05-04 (×5): 2 [IU] via SUBCUTANEOUS
  Administered 2012-05-04 – 2012-05-05 (×2): 1 [IU] via SUBCUTANEOUS
  Administered 2012-05-05 – 2012-05-06 (×3): 2 [IU] via SUBCUTANEOUS
  Administered 2012-05-06 – 2012-05-09 (×5): 1 [IU] via SUBCUTANEOUS
  Administered 2012-05-10: 2 [IU] via SUBCUTANEOUS
  Administered 2012-05-10 – 2012-05-11 (×2): 1 [IU] via SUBCUTANEOUS
  Administered 2012-05-12: 2 [IU] via SUBCUTANEOUS
  Administered 2012-05-12 – 2012-05-17 (×7): 1 [IU] via SUBCUTANEOUS
  Administered 2012-05-17: 2 [IU] via SUBCUTANEOUS
  Administered 2012-05-18: 0 [IU] via SUBCUTANEOUS
  Administered 2012-05-18 – 2012-05-19 (×2): 2 [IU] via SUBCUTANEOUS
  Administered 2012-05-20 – 2012-05-21 (×2): 1 [IU] via SUBCUTANEOUS

## 2012-05-02 NOTE — Consult Note (Signed)
Referring Physician: Dr. Letta Pate    Chief Complaint: Worsening R sided weakness, facial droop and slurred speech  HPI: [name] is an 66 y.o. 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  Patient was slowly getting better until 2 PM today when he was sitting in chair and his wife noticed right facial drooping and slurred speech.  Code stroke was called.  Patient feels weak on right side- upper and lower extremity and has facial droop right-sided. Also has expressive aphasia with slurred speech.  Patient is alert and oriented x3.  Patient denies any headache, change in vision, nausea vomiting, chest pain, short of breath, abdominal pain.  LSN:  tPA Given: No: recent CVA  Past Medical History  Diagnosis Date  . Coronary artery disease   . Diabetes mellitus   . Arthritis   . Hypertension   . Hypertensive crisis 03/07/2012  . Unstable angina 03/07/2012  . DM (diabetes mellitus),poorly controlled 03/07/2012  . CAD (coronary artery disease), with CABG in 2009 after an MI 03/07/2012  . Myocardial infarction     Past Surgical History  Procedure Date  . Cardiac surgery   . Coronary artery bypass graft   . Cardiac catheterization     No family history on file. Social History:  reports that he quit smoking about 30 years ago. He has never used smokeless tobacco. He reports that he drinks about .6 ounces of alcohol per week. He reports that he does not use illicit drugs.  Allergies: No Known Allergies  Medications: I have reviewed the patient's current medications.  ROS: Negative except in history of present illness. Worsening slurred speech and right-sided weakness with facial droop.   Physical Examination: Blood pressure 96/59, pulse 89, temperature 97.7 F (36.5 C), temperature source Oral, resp. rate 19, height 5\' 8"  (1.727 m), weight 191 lb 2.2 oz (86.7 kg), SpO2 98.00%.  Neurologic  Examination: Mental Status: Alert, oriented, thought content appropriate.  Slurred speech with expressive aphasia.   Cranial Nerves: Limited due to urgency of scans for code stroke. Right facial droop noted. Motor: Right upper extremity flaccid. Right lower extremity 2/5 motor strength.  Left upper and lower extremity strength 5/5 no atrophy noted Sensory: Decreased sensation to touch on right side.    Results for orders placed during the hospital encounter of 05/01/12 (from the past 48 hour(s))  GLUCOSE, CAPILLARY     Status: Abnormal   Collection Time   05/01/12  4:37 PM      Component Value Range Comment   Glucose-Capillary 228 (*) 70 - 99 mg/dL    Comment 1 Notify RN     GLUCOSE, CAPILLARY     Status: Abnormal   Collection Time   05/01/12  9:02 PM      Component Value Range Comment   Glucose-Capillary 194 (*) 70 - 99 mg/dL    Comment 1 Notify RN     GLUCOSE, CAPILLARY     Status: Abnormal   Collection Time   05/02/12  7:28 AM      Component Value Range Comment   Glucose-Capillary 184 (*) 70 - 99 mg/dL   CBC WITH DIFFERENTIAL     Status: Normal   Collection Time   05/02/12  7:38 AM      Component Value Range Comment   WBC 8.5  4.0 - 10.5 K/uL    RBC 5.53  4.22 - 5.81 MIL/uL    Hemoglobin 15.1  13.0 -  17.0 g/dL    HCT 43.7  39.0 - 52.0 %    MCV 79.0  78.0 - 100.0 fL    MCH 27.3  26.0 - 34.0 pg    MCHC 34.6  30.0 - 36.0 g/dL    RDW 14.0  11.5 - 15.5 %    Platelets 172  150 - 400 K/uL    Neutrophils Relative 69  43 - 77 %    Neutro Abs 5.9  1.7 - 7.7 K/uL    Lymphocytes Relative 20  12 - 46 %    Lymphs Abs 1.7  0.7 - 4.0 K/uL    Monocytes Relative 8  3 - 12 %    Monocytes Absolute 0.7  0.1 - 1.0 K/uL    Eosinophils Relative 2  0 - 5 %    Eosinophils Absolute 0.1  0.0 - 0.7 K/uL    Basophils Relative 1  0 - 1 %    Basophils Absolute 0.0  0.0 - 0.1 K/uL   COMPREHENSIVE METABOLIC PANEL     Status: Abnormal   Collection Time   05/02/12  7:38 AM      Component Value  Range Comment   Sodium 140  135 - 145 mEq/L    Potassium 3.6  3.5 - 5.1 mEq/L    Chloride 104  96 - 112 mEq/L    CO2 24  19 - 32 mEq/L    Glucose, Bld 213 (*) 70 - 99 mg/dL    BUN 14  6 - 23 mg/dL    Creatinine, Ser 1.06  0.50 - 1.35 mg/dL    Calcium 10.3  8.4 - 10.5 mg/dL    Total Protein 8.0  6.0 - 8.3 g/dL    Albumin 4.1  3.5 - 5.2 g/dL    AST 13  0 - 37 U/L    ALT 15  0 - 53 U/L    Alkaline Phosphatase 70  39 - 117 U/L    Total Bilirubin 0.5  0.3 - 1.2 mg/dL    GFR calc non Af Amer 73 (*) >90 mL/min    GFR calc Af Amer 84 (*) >90 mL/min   GLUCOSE, CAPILLARY     Status: Abnormal   Collection Time   05/02/12 11:43 AM      Component Value Range Comment   Glucose-Capillary 219 (*) 70 - 99 mg/dL   GLUCOSE, CAPILLARY     Status: Abnormal   Collection Time   05/02/12  1:49 PM      Component Value Range Comment   Glucose-Capillary 199 (*) 70 - 99 mg/dL    No results found.  Assessment: 66 y.o. male with worsening right-sided weakness with facial droop. Patient had recent left posterior internal capsule CVA and was started on aspirin and Plavix. Probable extension of subcortical left ischemic stroke as etiology for speech changes and exacerbation of rt hemiparesis. Carotid Dopplers 2 days before showed mild ICA origin plaques. 2-D echo with EF 30-35% HbA1c-8.7. LDL cholesterol 130 and HDL 37.  Stroke Risk Factors - diabetes mellitus, hyperlipidemia and hypertension  Plan: - Stat CT head with CTA per Dr. Les Pou to evaluate for worsening/new CVA and to see if patient is an operative candidate.  Continue Prophylactic therapy-Antiplatelet med: Aspirin - dose 81 mg daily  and Antiplatelet med: Plavix - dose 75 mg daily  Continue Risk factor modification Telemetry monitoring  Continue Frequent neuro checks. Patient hypotensive-blood pressure 96/59 - 1 L normal saline bolus running.  CT head shows  no hemorrhage. CTA shows no new stenoses/block amenable to vascular procedure. Will  get a repeat MRI brain without contrast.  - Continue ASA and Plavix with no more vascular/stroke w/u indicated. - Continue inpatient rehab- Per Dr. Nicole Kindred.  PATEL,RAVI  05/02/2012, 3:21 PM

## 2012-05-02 NOTE — Progress Notes (Signed)
SLP Cancellation Note  Evaluation cancelled today due to medical issues with patient which prohibited therapy. As a result, speech-language evaluation is differed until 05/03/12  Gunnar Fusi, M.A., [place] [phone]  [place] 05/02/2012, 1:58 PM

## 2012-05-02 NOTE — Progress Notes (Signed)
Nursing reports that patient with inability to speak with ST session.  Exam: patient alert but anxious appearing. Slightly diaphoretic Aphasic with occasional grunting sounds.  Pronounced right facial droop, RUE flaccid and RLE with 1-2/5 strength. Heart: RRR. Lungs: clear.   A/P 1. ?Extension of CVA/? Another embolic event:  Discussed with Dr. Nicole Kindred who recommends CT head stat with CTA head/neck to see if patient would need intervention. Will place patient on bedrest, start IVF and O2. Close neuro monitoring.

## 2012-05-02 NOTE — Significant Event (Signed)
Nurse tech rounded on patient and found him lethargic sitting in chair. Wife states, "he doesn't look right." Patient grabbed chest with left hand. NT asked pt if he was having chest pain and patient replied incoherent mumbles. Patient was unable to follow NT's finger and vision was unfocused, facial droop noted to right side and unable to grip with R hand. NT alerted RN to patient complaining of chest pains. RN arrived to room to find patient unable to move right side. Patient's face drooped to right side and was mumbling incoherently. RN alerted rapid response team with questionable code stroke and alerted Algis Liming, PA. Orders received for stat head CT and IV NS bolus. O2 2L [place] placed and patient's HOB flattened. V/S WDL. CBG 199. Patient sent to stat head CT at 1425. [name]

## 2012-05-02 NOTE — Progress Notes (Signed)
Social Work Patient ID: [name], male   DOB: 1946/02/17, 66 y.o.   MRN: [id number] Gave pt and wife Medicaid worker's name-Teresa Gillard.  She comes to [facility] to assist with medicaid applications.  They will contact Ashley-financial counselor to ask questions regarding status of pt's Medicaid.  Continue to work on [employer].  Both pleased with how well his day has gone today.

## 2012-05-02 NOTE — Care Management Note (Signed)
[place] Individual Statement of Services  Patient Name:  [name]  Date:  05/02/2012  Welcome to the [facility].  Our goal is to provide you with an individualized program based on your diagnosis and situation, designed to meet your specific needs.  With this comprehensive rehabilitation program, you will be expected to participate in at least 3 hours of rehabilitation therapies Monday-Friday, with modified therapy programming on the weekends.  Your rehabilitation program will include the following services:  Physical Therapy (PT), Occupational Therapy (OT), 24 hour per day rehabilitation nursing, Therapeutic Recreaction (TR), Neuropsychology, Case Management (RN and Social Worker), Rehabilitation Medicine, Nutrition Services and Pharmacy Services  Weekly team conferences will be held on Wednesday to discuss your progress.  Your RN Case Writer will talk with you frequently to get your input and to update you on team discussions.  Team conferences with you and your family in attendance may also be held.  Expected length of stay: 2 weeks  Overall anticipated outcome: Supervision/mod i level  Depending on your progress and recovery, your program may change.  Your RN Case Engineer, production will coordinate services and will keep you informed of any changes.  Your RN Tourist information centre manager and SW names and contact numbers are listed  below.  The following services may also be recommended but are not provided by the [facility] will be made to provide these services after discharge if needed.  Arrangements include referral to agencies that provide these services.  Your insurance has been verified to be:  Pending Medicaid Your primary doctor is:  [facility]  Pertinent information will be shared with your doctor and your insurance company.    Social Worker:  Ovidio Kin, [place]  Information discussed with and copy given to patient by: Elease Hashimoto, 05/02/2012, 9:49 AM

## 2012-05-02 NOTE — Progress Notes (Signed)
Social Work Assessment and Plan Social Work Assessment and Plan  Patient Details  Name: [name] MRN: [id number] Date of Birth: [birth year]  Today's Date: 05/02/2012  Problem List:  Patient Active Problem List  Diagnosis  . DM (diabetes mellitus),poorly controlled  . CAD (coronary artery disease), with CABG in 2009 after an MI  . NSTEMI - Occluded SVG-OM, s/p BMS 03/08/12  . Dyslipidemia, (HDL 25)  . Acute ischemic stroke  . Hypertensive emergency  . Hyperglycemia   Past Medical History:  Past Medical History  Diagnosis Date  . Coronary artery disease   . Diabetes mellitus   . Arthritis   . Hypertension   . Hypertensive crisis 03/07/2012  . Unstable angina 03/07/2012  . DM (diabetes mellitus),poorly controlled 03/07/2012  . CAD (coronary artery disease), with CABG in 2009 after an MI 03/07/2012  . Myocardial infarction    Past Surgical History:  Past Surgical History  Procedure Date  . Cardiac surgery   . Coronary artery bypass graft   . Cardiac catheterization    Social History:  reports that he quit smoking about 30 years ago. He has never used smokeless tobacco. He reports that he drinks about .6 ounces of alcohol per week. He reports that he does not use illicit drugs.  Family / Support Systems Marital Status: Married Patient Roles: Spouse;Parent Spouse/Significant Other: Mikle Bosworth  [phone]  [phone] Children: Dtr local and son Other Supports: Sister Anticipated Caregiver: Wife and daughter to assist, along with pt's sister Ability/Limitations of Caregiver: None-wife is in good health Caregiver Availability: 24/7 Family Dynamics: Pt reports close family who help one another.  He has extended family and children who are supportive  Social History Preferred language: English Religion: Baptist Cultural Background: No issues Education: High School Read: Yes Write: Yes Employment Status: Retired Date Retired/Disabled/Unemployed: Retired early  at 33 Legal Hisotry/Current Legal Issues: No issues Guardian/Conservator: None-according to MD pt is capable of making his own decisions.  He reprots he will consult with wife.   Abuse/Neglect Physical Abuse: Denies Verbal Abuse: Denies Sexual Abuse: Denies Exploitation of patient/patient's resources: Denies Self-Neglect: Denies  Emotional Status Pt's affect, behavior adn adjustment status: Pt is motivated to improve and regain his independence again, he has always been very independent.  Wife reports he will work hard to achieve this level. Recent Psychosocial Issues: Other medical issues-cardiac Pyschiatric History: No history- depression screen score-2 He feels optimisitc he will do well and is looking forward to therapies here.  Will continue to monitor his coping while here. Substance Abuse History: No issues  Patient / Family Perceptions, Expectations & Goals Pt/Family understanding of illness & functional limitations: Pt and wife have a good understanding of his deficits, but have not been explained to the reason for his stroke.  He feels it was his cardiac meds, now on the meds he was switched from.  Wife is hopeful he will do well and will be here to supportive him in therapies. Premorbid pt/family roles/activities: Husband, Father, Retiree, Home owner, Child psychotherapist, etc Anticipated changes in roles/activities/participation: Resume roles at discharge Pt/family expectations/goals: Pt states: " I want to the best that I can here to become independent again."  Wife reports: " I hope he can do for himself, by the time he leaves here. "  [employer]: Other (Comment) ([facility]) Premorbid Home Care/DME Agencies: None Transportation available at discharge: Wife Resource referrals recommended: Support group (specify) (CVA Support Group)  Discharge Planning Living Arrangements: Spouse/significant other Support Systems:  Spouse/significant  other;Children;Other relatives;Friends/neighbors;Church/faith community Type of Residence: Private residence Insurance Resources: Medicaid (specify county) (Pending applied 8/2) Financial Resources: Radio broadcast assistant Screen Referred: Previously completed Living Expenses: Education officer, community Management: Patient;Spouse Do you have any problems obtaining your medications?: Yes (Describe) (Cost can be an issue-due to no insurance currently) Home Management: Wife and Pt did together Patient/Family Preliminary Plans: Return home with wife providing the majority of his care, with children and sister helping also.  Wife is here to observe in therapies today. Social Work Anticipated Follow Up Needs: HH/OP;Support Group DC Planning Additional Notes/Comments: Motivated gentleman who wants to achieve independence and suppotive wife  Clinical Impression Pleasant gentleman who is motivated and optimistic regarding his recovery from this CVA.  Concerned about lack of insurance and worries about this, since it prevents him from seeing a cardiologist due to private cost. Meds cost also has to be considered.  Will try to find out the status of his Medicaid and put pt in touch with the assigned worker.  Elease Hashimoto 05/02/2012, 10:28 AM

## 2012-05-02 NOTE — Progress Notes (Signed)
Patient information reviewed and entered into [employer] system by Cathlyn Tersigni, RN, CRRN, PPS Coordinator.  Information including medical coding and functional independence measure will be reviewed and updated through discharge.    

## 2012-05-02 NOTE — Care Management Note (Signed)
    Page 1 of 1   05/02/2012     10:35:14 AM   CARE MANAGEMENT NOTE 05/02/2012  Patient:  [name], [name]   Account Number:  [id number]  Date Initiated:  04/29/2012  Documentation initiated by:  [facility]  Subjective/Objective Assessment:   Admitted with CVA, acute left basal ganglia infarct, hypertensive crisis.     Action/Plan:   PT/OT evals-recommending inpatient rehab  CIR eval-   Anticipated DC Date:  05/02/2012   Anticipated DC Plan:  [place]         Choice offered to / List presented to:             Status of service:  Completed, signed off Medicare Important Message given?   (If response is "NO", the following Medicare IM given date fields will be blank) Date Medicare IM given:   Date Additional Medicare IM given:    Discharge Disposition:  IP REHAB FACILITY  Per UR Regulation:  Reviewed for med. necessity/level of care/duration of stay  If discussed at [place] of Stay Meetings, dates discussed:    Comments:  05/01/12 Lars Pinks, RN, BSN 1400 PT WAS DC'D TO CIR

## 2012-05-02 NOTE — Evaluation (Signed)
Physical Therapy Assessment and Plan  Patient Details  Name: [name] MRN: [id number] Date of Birth: Jul 22, 1946  PT Diagnosis: Abnormal posture, Abnormality of gait, Difficulty walking, Hemiplegia dominant and Hypotonia Rehab Potential: Excellent ELOS: 2 weeks   Today's Date: 05/02/2012 Time: 0900-1000 Time Calculation (min): 60 min Missed 30 minutes PT in pm secondary to being on hold medically, bed rest secondary to possible CVA extension.  Will f/u with patient once cleared for activity.  Problem List:  Patient Active Problem List  Diagnosis  . DM (diabetes mellitus),poorly controlled  . CAD (coronary artery disease), with CABG in 2009 after an MI  . NSTEMI - Occluded SVG-OM, s/p BMS 03/08/12  . Dyslipidemia, (HDL 25)  . Acute ischemic stroke  . Hypertensive emergency  . Hyperglycemia    Past Medical History:  Past Medical History  Diagnosis Date  . Coronary artery disease   . Diabetes mellitus   . Arthritis   . Hypertension   . Hypertensive crisis 03/07/2012  . Unstable angina 03/07/2012  . DM (diabetes mellitus),poorly controlled 03/07/2012  . CAD (coronary artery disease), with CABG in 2009 after an MI 03/07/2012  . Myocardial infarction    Past Surgical History:  Past Surgical History  Procedure Date  . Cardiac surgery   . Coronary artery bypass graft   . Cardiac catheterization     Assessment & Plan Clinical Impression: Patient is a 66 y.o. RH-male with history of poorly controlled DM, CAD with stent 03/07/12; admitted on 08/19 with complaints dizziness and right sided weakness. MRI/MRA brain done revealing acute infarct left internal capsule, remote lacunae affecting bilateral brainstem and thalami, no flow limiting stenosis. 2D echo done revealing EF 30% with diffuse hypokinesis and moderate hypokinesis. Carotid dopplers with intimal wall thickening CCA. Patient's Effient changed to Plavix per input with cardiology. Had worsening of right sided weakness  past admission and follow up CCT without evidence of hemorrhage or extension.  Patient transferred to CIR on 05/01/2012 .   Patient currently requires max with mobility secondary to muscle weakness, impaired timing and sequencing, decreased coordination and decreased motor planning and decreased standing balance, decreased postural control, hemiplegia and decreased balance strategies.  Prior to hospitalization, patient was independent with mobility and lived with Spouse in a [place] home.  Home access is 1 step curbStairs to enter.  Patient will benefit from skilled PT intervention to maximize safe functional mobility, minimize fall risk and decrease caregiver burden for planned discharge home with intermittent assist.  Anticipate patient will benefit from follow up OP at discharge.  PT - End of Session Activity Tolerance: Tolerates 30+ min activity with multiple rests Endurance Deficit: No PT Assessment Rehab Potential: Excellent Barriers to Discharge: None PT Plan PT Frequency: 2-3 X/day, 60-90 minutes;5 out of 7 days Estimated Length of Stay: 2 weeks PT Treatment/Interventions: Ambulation/gait training;Balance/vestibular training;Community reintegration;Discharge planning;DME/adaptive equipment instruction;Functional electrical stimulation;Functional mobility training;Neuromuscular re-education;Patient/family education;Splinting/orthotics;Stair training;Therapeutic Activities;Therapeutic Exercise;UE/LE Strength taining/ROM;UE/LE Coordination activities;Wheelchair propulsion/positioning PT Recommendation Follow Up Recommendations: Outpatient PT Equipment Recommended: Rolling walker with 5" wheels;Other (comment) (AFO)  PT Evaluation Precautions/Restrictions Precautions Precautions: Fall Precaution Comments: DM. CAD with stents Restrictions Weight Bearing Restrictions: No General @[id number] ([phone]) Vital Signs Therapy Vitals Pulse Rate: 95  BP: 142/99 mmHg Patient Position,  if appropriate: Sitting Oxygen Therapy SpO2: 95 % O2 Device: None (Room air) Pain Pain Assessment Pain Assessment: No/denies pain Pain Score: 0-No pain Home Living/Prior Functioning Home Living Lives With: Spouse Available Help at Discharge: Family Type of Home: Apartment Home Access: Stairs  to enter Entrance Stairs-Number of Steps: 1 step curb Entrance Stairs-Rails: None Home Layout: One level Bathroom Shower/Tub: Chiropodist: Standard Home Adaptive Equipment: Grab bars in shower Prior Function Level of Independence: Independent with gait;Independent with transfers Able to Take Stairs?: Yes Driving: Yes Vocation: Retired Radiographer, therapeutic - History Baseline Vision: Wears glasses only for reading Patient Visual Report: No change from baseline Vision - Assessment Eye Alignment: Within Functional Limits Vision Assessment: Vision tested Ocular Range of Motion: Within Functional Limits Tracking/Visual Pursuits: Able to track stimulus in all quads without difficulty Saccades: Within functional limits Depth Perception: WFL Perception Perception: Within Functional Limits  Cognition Overall Cognitive Status: Appears within functional limits for tasks assessed Arousal/Alertness: Awake/alert Orientation Level: Oriented to person;Oriented to place;Oriented to time;Oriented to situation Sensation Sensation Light Touch: Appears Intact Stereognosis: Appears Intact Hot/Cold: Appears Intact Proprioception: Appears Intact Coordination Gross Motor Movements are Fluid and Coordinated: No Fine Motor Movements are Fluid and Coordinated: No Coordination and Movement Description: Poor RLE coordination during gait; slight LE ADD during swing but able to correct with verbal cues Heel Shin Test: Limited by weakness Motor  Motor Motor: Hemiplegia, abnormal postural control and alignment in standing; RLE adducts during gait but able to correct with verbal  cues Mobility Transfers Stand Pivot Transfers: 2: Max assist Stand Pivot Transfer Details (indicate cue type and reason): With verbal cues for safe hand placement, assistance for lifting and controlled lowering and for safety and balance during pivoting secondary to R foot drop/drag Locomotion  Ambulation Ambulation/Gait Assistance: 1: +1 Total assist Ambulation Distance (Feet): 15 Feet Assistive device: 1 person hand held assist Ambulation/Gait Assistance Details: Without AD patient requires total A for gait secondary to impaired postural control, R foot drop and genu recurvatum due to posterior and R lateral lean, R pelvic retracted and poor anterior translation of COG over R stance LE Gait Gait Pattern: Step-to pattern;Decreased step length - left;Decreased stance time - right;Decreased hip/knee flexion - right;Decreased dorsiflexion - right;Decreased weight shift to left;Lateral hip instability;Lateral trunk lean to right;Trunk rotated posteriorly on right;Narrow base of support;Scissoring;Ataxic Stairs / Additional Locomotion Stairs: Yes Stairs Assistance: 3: Mod assist Stairs Assistance Details (indicate cue type and reason): Verbal and visual demonstration for safe stepping sequence ascending with LLE and descending with RLE and assistance to maintain RUE placement on rail and assistance to fully advance RLE to next step; with verbal cues patient able to demonstrate increased R hip and knee flexion for full foot clearance to next step Stair Management Technique: Two rails;Step to pattern;Forwards Number of Stairs: 5  Height of Stairs: 6  Wheelchair Mobility Wheelchair Mobility: No Distance: 150 total A  Trunk/Postural Assessment  Postural Control Postural Control: Deficits on evaluation (in standing R and posterior lean)  Balance   Extremity Assessment  RUE Assessment RUE Assessment: Exceptions to [facility] (strength grossly 3-/5, tricep 4/5, shoulder flexion 70 deg) LUE  Assessment LUE Assessment: Within Functional Limits RLE Assessment RLE Assessment: Exceptions to [facility] RLE Strength RLE Overall Strength: Deficits RLE Overall Strength Comments: 2/5 throughout; poor functional muscle endurance LLE Assessment LLE Assessment: Within Functional Limits  See FIM for current functional status Refer to Care Plan for Long Term Goals  Recommendations for other services: None  Discharge Criteria: Patient will be discharged from PT if patient refuses treatment 3 consecutive times without medical reason, if treatment goals not met, if there is a change in medical status, if patient makes no progress towards goals or if patient is discharged  from hospital.  The above assessment, treatment plan, treatment alternatives and goals were discussed and mutually agreed upon: by patient and by family  Initiated gait training during am session with use of RW with R hand orthosis x 25' +15' on level surface with mod A overall with verbal and tactile cues for full L lateral weight shifting, hip and knee flexion on R for full foot clearance and for anterior translation of COG over R stance LE to minimize genu recurvatum with improved stability in stance.  Patient fatigued very quickly.  Raylene Everts Faucette 05/02/2012, 10:27 AM

## 2012-05-02 NOTE — Progress Notes (Signed)
Subjective/Complaints: bp elevated this am, but feels all right. Slept well A 12 point review of systems has been performed and if not noted above is otherwise negative.   Objective: Vital Signs: Blood pressure 142/99, pulse 95, temperature 97.7 F (36.5 C), temperature source Oral, resp. rate 19, height 5\' 8"  (1.727 m), weight 86.7 kg (191 lb 2.2 oz), SpO2 95.00%. No results found.  Basename 05/02/12 0738 05/01/12 0657  WBC 8.5 7.5  HGB 15.1 14.6  HCT 43.7 43.1  PLT 172 166    Basename 05/02/12 0738 05/01/12 0657  NA 140 143  K 3.6 3.4*  CL 104 107  CO2 24 23  GLUCOSE 213* 208*  BUN 14 14  CREATININE 1.06 1.13  CALCIUM 10.3 10.0   CBG (last 3)   Basename 05/02/12 0728 05/01/12 2102 05/01/12 1637  GLUCAP 184* 194* 228*    Wt Readings from Last 3 Encounters:  05/01/12 86.7 kg (191 lb 2.2 oz)  04/28/12 87.8 kg (193 lb 9 oz)  03/09/12 91.7 kg (202 lb 2.6 oz)    Physical Exam:  Nursing note and vitals reviewed.  Constitutional: He is oriented to person, place, and time. He appears well-developed and well-nourished.  HENT:  Head: Normocephalic and atraumatic.  Eyes: Pupils are equal, round, and reactive to light.  Neck: Normal range of motion. Neck supple.  Cardiovascular: Normal rate and regular rhythm. No murmurs or gallops  Pulmonary/Chest: Effort normal and breath sounds normal. No rales or wheezes, no distress  Abdominal: Soft. Bowel sounds are normal. Slightly distended Musculoskeletal: Normal range of motion. He exhibits no edema.  Neurological: He is alert and oriented to person, place, and time.  Speech with minimal dysarthria. Mild right facial droop. RUE is 2-3/5. RLE is 2/5 prox to 1-2 distally. Sensation is 1/2 right arm, leg. DTR's are 3+ on right. Right central 7 and 12. Cognitively the patient is intact.  Skin: Skin is warm and dry.    Assessment/Plan: 1. Functional deficits secondary to left internal capsule infarct with right hemiparesis which  require 3+ hours per day of interdisciplinary therapy in a comprehensive inpatient rehab setting. Physiatrist is providing close team supervision and 24 hour management of active medical problems listed below. Physiatrist and rehab team continue to assess barriers to discharge/monitor patient progress toward functional and medical goals. FIM:       FIM - Toileting Toileting: 1: Two helpers     FIM - Control and instrumentation engineer Devices: Arm rests Bed/Chair Transfer: 2: Bed > Chair or W/C: Max A (lift and lower assist);2: Chair or W/C > Bed: Max A (lift and lower assist)  FIM - Locomotion: Wheelchair Distance: 150 Locomotion: Wheelchair: 1: Total Assistance/staff pushes wheelchair (Pt<25%) FIM - Locomotion: Ambulation Ambulation/Gait Assistance: 1: +1 Total assist Locomotion: Ambulation: 1: Travels less than 50 ft with total assistance/helper does all (Pt.<25%) (without AD; with RW mod A)  Comprehension Comprehension Mode: Auditory Comprehension: 6-Follows complex conversation/direction: With extra time/assistive device  Expression Expression Mode: Verbal Expression: 6-Expresses complex ideas: With extra time/assistive device  Social Interaction Social Interaction: 6-Interacts appropriately with others with medication or extra time (anti-anxiety, antidepressant).  Problem Solving Problem Solving: 6-Solves complex problems: With extra time  Memory Memory: 6-Assistive device: No helper  Medical Problem List and Plan:  1. DVT Prophylaxis/Anticoagulation: Pharmaceutical: Lovenox  2. Pain Management: N/A  3. Mood: seems appropriate. Ego support by team 4. Neuropsych: This patient is capable of making decisions on his/her own behalf.  5.HTN: monitor with bid  checks. Continue Imdur, Prinivil, norvac and coreg. Will need further titration for better control- most likely 6. DM type 2: monitor with bid checks. Resume metformin as studies without contrast and  discontinue lantus. SSI for elevated BS.  7. Hypokalemia: low normal on admit labs.  8. Dyslipidemia: continue Lipitor.  9. CAD: continue Lipitor, coreg, Imdur and Prinivil. Now on ASA and plavix due to recent stent/CVA  LOS (Days) 1 A FACE TO FACE EVALUATION WAS PERFORMED  SWARTZ,ZACHARY T 05/02/2012, 10:15 AM

## 2012-05-02 NOTE — Progress Notes (Signed)
Called by Rn to evaluate patient.  Upon arrival to patients room family and RN at bedside.  Patient sitting in chair, with more of a facial droop, more weakness on right side as per family.  Patient is alert and has slurred speech.  Pam, PA at bedside.  VSS, patient assisted back to bed orders received.  Chart reviewed RN to call if assistance needed

## 2012-05-02 NOTE — Evaluation (Signed)
Occupational Therapy Assessment and Plan  Patient Details  Name: MARCELLOUS VELTMAN MRN: [id number] Date of Birth: 1946/04/11  OT Diagnosis: ataxia, hemiplegia affecting dominant side and muscle weakness (generalized) Rehab Potential: Rehab Potential: Good ELOS: 10-12 days   Today's Date: 05/02/2012 Time: 0730-0830 Time Calculation (min): 60 min  Problem List:  Patient Active Problem List  Diagnosis  . DM (diabetes mellitus),poorly controlled  . CAD (coronary artery disease), with CABG in 2009 after an MI  . NSTEMI - Occluded SVG-OM, s/p BMS 03/08/12  . Dyslipidemia, (HDL 25)  . Acute ischemic stroke  . Hypertensive emergency  . Hyperglycemia    Past Medical History:  Past Medical History  Diagnosis Date  . Coronary artery disease   . Diabetes mellitus   . Arthritis   . Hypertension   . Hypertensive crisis 03/07/2012  . Unstable angina 03/07/2012  . DM (diabetes mellitus),poorly controlled 03/07/2012  . CAD (coronary artery disease), with CABG in 2009 after an MI 03/07/2012  . Myocardial infarction    Past Surgical History:  Past Surgical History  Procedure Date  . Cardiac surgery   . Coronary artery bypass graft   . Cardiac catheterization     Assessment & Plan Clinical Impression: Patient is a 66 y.o. year old male with recent admission to the hospital on 08/19 with complaints dizziness and right sided weakness. MRI/MRA brain done revealing acute infarct left internal capsule, remote lacunae affecting bilateral brainstem and thalami, no flow limiting stenosis. 2D echo done revealing EF 30% with diffuse hypokinesis and moderate hypokinesis. Carotid dopplers with intimal wall thickening CCA. Patient's Effient changed to Plavix per input with cardiology. Had worsening of right sided weakness past admission and follow up CCT without evidence of hemorrhage or extension. Therapies initiated and CIR recommended for progressive therapies   Patient transferred to CIR on 05/01/2012 .     Patient currently requires mod with basic self-care skills secondary to muscle weakness, abnormal tone, unbalanced muscle activation, ataxia, decreased coordination and decreased motor planning and decreased standing balance, decreased postural control and hemiplegia.  Prior to hospitalization, patient could complete ADLs with independence.  Patient will benefit from skilled intervention to decrease level of assist with basic self-care skills and increase independence with basic self-care skills prior to discharge home with care partner.  Anticipate patient will require intermittent supervision and follow up outpatient.  OT - End of Session Activity Tolerance: Tolerates 30+ min activity without fatigue Endurance Deficit: No OT Assessment Rehab Potential: Good OT Plan OT Frequency: 1-2 X/day, 60-90 minutes Estimated Length of Stay: 10-12 days OT Treatment/Interventions: Balance/vestibular training;Community reintegration;Discharge planning;DME/adaptive equipment instruction;Functional mobility training;Neuromuscular re-education;Pain management;Patient/family education;Psychosocial support;Self Care/advanced ADL retraining;Therapeutic Activities;Therapeutic Exercise;UE/LE Strength taining/ROM;UE/LE Coordination activities OT Recommendation Follow Up Recommendations: Outpatient OT Equipment Recommended: Rolling walker with 5" wheels;Other (comment) (AFO)  OT Evaluation Precautions/Restrictions  Precautions Precautions: Fall Precaution Comments: DM. CAD with stents Restrictions Weight Bearing Restrictions: No General   Vital Signs Therapy Vitals Pulse Rate: 95  BP: 142/99 mmHg Patient Position, if appropriate: Sitting Oxygen Therapy SpO2: 95 % O2 Device: None (Room air) Pain Pain Assessment Pain Assessment: No/denies pain Pain Score: 0-No pain Home Living/Prior Functioning Home Living Lives With: Spouse Available Help at Discharge: Family Type of Home: Apartment Home  Access: Stairs to enter Technical brewer of Steps: 1 step curb Entrance Stairs-Rails: None Home Layout: One level Bathroom Shower/Tub: Chiropodist: Standard Home Adaptive Equipment: Grab bars in shower IADL History Homemaking Responsibilities: Yes Meal Prep Responsibility: Secondary Prior  Function Level of Independence: Independent with gait;Independent with transfers Able to Take Stairs?: Yes Driving: Yes Vocation: Retired ADL ADL Upper Body Bathing: Minimal assistance Where Assessed-Upper Body Bathing: Edge of bed Lower Body Bathing: Moderate assistance Where Assessed-Lower Body Bathing: Edge of bed Upper Body Dressing: Moderate assistance Where Assessed-Upper Body Dressing: Edge of bed Lower Body Dressing: Moderate assistance Where Assessed-Lower Body Dressing: Edge of bed Toilet Transfer: Moderate assistance Toilet Transfer Method: Counselling psychologist: Grab bars ADL Comments: Pt with ataxic gait, requiring mod cues for RLE clearance and step pattern.  RUE shoulder flexion approx 70 degrees, decreased grip strength affecting function with self-care tasks.   Vision/Perception  Vision - History Baseline Vision: Wears glasses only for reading Patient Visual Report: No change from baseline Vision - Assessment Eye Alignment: Within Functional Limits Vision Assessment: Vision tested Ocular Range of Motion: Within Functional Limits Tracking/Visual Pursuits: Able to track stimulus in all quads without difficulty Saccades: Within functional limits Depth Perception: WFL Perception Perception: Within Functional Limits  Cognition Overall Cognitive Status: Appears within functional limits for tasks assessed Arousal/Alertness: Awake/alert Orientation Level: Oriented to person;Oriented to place;Oriented to time;Oriented to situation Sensation Sensation Light Touch: Appears Intact Stereognosis: Appears Intact Hot/Cold: Appears  Intact Proprioception: Appears Intact Coordination Gross Motor Movements are Fluid and Coordinated: No Fine Motor Movements are Fluid and Coordinated: No Coordination and Movement Description: Poor RLE coordination during gait; slight LE ADD during swing but able to correct with verbal cues Heel Shin Test: Limited by weakness Motor  Motor Motor: Hemiplegia;Ataxia Mobility     Trunk/Postural Assessment  Postural Control Postural Control: Deficits on evaluation (in standing R and posterior lean)  Balance   Extremity/Trunk Assessment RUE Assessment RUE Assessment: Exceptions to [facility] (strength grossly 3-/5, tricep 4/5, shoulder flexion 70 deg) LUE Assessment LUE Assessment: Within Functional Limits  See FIM for current functional status Refer to Care Plan for Long Term Goals  Recommendations for other services: None  Discharge Criteria: Patient will be discharged from OT if patient refuses treatment 3 consecutive times without medical reason, if treatment goals not met, if there is a change in medical status, if patient makes no progress towards goals or if patient is discharged from hospital.  The above assessment, treatment plan, treatment alternatives and goals were discussed and mutually agreed upon: by patient and by family  Treatment Session: OT eval initiated, ADL assessment conducted at EOB.  Pt BP elevated with PA and RN informed.  Completed bathing and dressing at EOB while monitoring symptoms of elevated BP.  Pt required assist with sit <> stand secondary to RLE weakness and ataxic movements as well as some ?impulsivity with quick movements.  Pt required assist with UB bathing and dressing secondary to RUE decreased grip strength and limited shoulder ROM.  Pt ambulated with mod assist with use of bed rail for steadying from bed to chair.  Wife present at end of session.  Sim Boast 05/02/2012, 10:46 AM

## 2012-05-03 ENCOUNTER — Inpatient Hospital Stay (HOSPITAL_COMMUNITY): Payer: Medicaid Other

## 2012-05-03 ENCOUNTER — Ambulatory Visit (HOSPITAL_COMMUNITY): Payer: Self-pay | Admitting: Physical Therapy

## 2012-05-03 ENCOUNTER — Inpatient Hospital Stay (HOSPITAL_COMMUNITY): Payer: Medicaid Other | Admitting: Speech Pathology

## 2012-05-03 ENCOUNTER — Inpatient Hospital Stay (HOSPITAL_COMMUNITY): Payer: Medicaid Other | Admitting: Physical Therapy

## 2012-05-03 LAB — GLUCOSE, CAPILLARY
Glucose-Capillary: 185 mg/dL — ABNORMAL HIGH (ref 70–99)
Glucose-Capillary: 197 mg/dL — ABNORMAL HIGH (ref 70–99)

## 2012-05-03 NOTE — Progress Notes (Signed)
Physical Therapy Session Note  Patient Details  Name: [name] MRN: [id number] Date of Birth: 13-Aug-1946  Today's Date: 05/03/2012 Time:  - 0900-0955 (55 minutes) individual    Short Term Goals: Week 1:  PT Short Term Goal 1 (Week 1): Patient will perform bed <> w/c transfers consistently to L and R with min A PT Short Term Goal 2 (Week 1): Patient will perform w/c mobility on unit x 150 with L hemi technique and min A PT Short Term Goal 3 (Week 1): Patient will perform gait training on unit x 39' with LRAD and appropriate orthosis on RLE with min A  PT Short Term Goal 4 (Week 1): Patient will perform up and down one step/curb with LRAD and appropriate orthosis with mod A  Skilled Therapeutic Interventions/Progress Updates: Focus of treatment- Neuro re-ed RT LE; gait training with appropriate assistive device ; wc mobility training; therapeutic activity to facilitate RT knee/hip control in stance.     Therapy Documentation Precautions:  Precautions Precautions: Fall Precaution Comments: DM. CAD with stents Restrictions Weight Bearing Restrictions: No General: Pt up in wc /    Vital Signs: Therapy Vitals Temp: 98 F (36.7 C) Temp src: Oral Pulse Rate: 87  Resp: 18  BP: 151/100 mmHg Patient Position, if appropriate: Lying Oxygen Therapy SpO2: 100 % O2 Device: Nasal cannula O2 Flow Rate (L/min): 3 L/min Pain: no complaint of pain   Mobility: Transfers- scoot to squat/pivot to right min assist with max vcs for wc placement, brakes , footrests; sit to supine SBA (mat) with difficulty RT LE; supine to sit SBA with difficulty and increased time   Locomotion : Gait 60 feet X 1 with RW + hand splint RT UE  + ace wrap RT ankle mod assist   with flexed knee in stance (unable to correct with vcing ; wc mobility 120 feet min assist using Lt extremities with demonstration cues for technique Trunk/Postural Assessment : Sit to stand from mat min assist ; Standing- min assist -  pt unable to maintain terminal knee extension on right with cues =     Exercises: Neuro re- ed - RT LE heel slides X 10, hip abduction in supine X 10 , hip ER/IR  X 10 in hooklying , manual leg press X 10  Other Treatments:   Step ups RT LE to 4 inch step min assist with mod tactile cues to facilitate terminal knee extension on right in stance with hip extension  2 X 10  See FIM for current functional status  Therapy/Group: Individual Therapy  Oney Tatlock,JIM 05/03/2012, 8:30 AM

## 2012-05-03 NOTE — Progress Notes (Signed)
Subjective/Complaints: Slept well. No new problems overnight.  A 12 point review of systems has been performed and if not noted above is otherwise negative.   Objective: Vital Signs: Blood pressure 151/100, pulse 87, temperature 98 F (36.7 C), temperature source Oral, resp. rate 18, height 5\' 8"  (1.727 m), weight 86.7 kg (191 lb 2.2 oz), SpO2 100.00%. Ct Angio Head W/cm &/or Wo Cm  05/02/2012  *RADIOLOGY REPORT*  Clinical Data:  Acute onset of worsening weakness.  CT ANGIOGRAPHY HEAD AND NECK  Technique:  Multidetector CT imaging of the head and neck was performed using the standard protocol during bolus administration of intravenous contrast.  Multiplanar CT image reconstructions including MIPs were obtained to evaluate the vascular anatomy. Carotid stenosis measurements (when applicable) are obtained utilizing NASCET criteria, using the distal internal carotid diameter as the denominator.  Contrast: 26mL OMNIPAQUE IOHEXOL 350 MG/ML SOLN  Comparison:  Most recent CT head 04/30/2012.Also MRI and MRA from 04/28/2012.  CTA NECK  Findings:   Conventional branching of the great vessels from the arch.  No proximal stenosis or vascular occlusion. Mild nonstenotic atheromatous change at the internal carotid arteries bifurcations bilaterally.  Left vertebral dominant.  No neck masses.  Clear lung apices.  Prior CABG.   Review of the MIP images confirms the above findings.  IMPRESSION:  There is no flow limiting stenosis or occlusion of the carotid arteries.  Continued dominant left vertebral with patent but chronically small right vertebral.  CTA HEAD  Findings:  Well demarcated hypodensity posterior limb internal capsule on the left correspond to the area of previously identified acute infarction.  Compared to the area of restricted diffusion on 04/27/2012, the area of hypodensity is increased (6 x 10 mm).  No other areas of acute infarction are definitely identified.  There is no supraclinoid internal carotid  artery flow limiting stenosis or occlusion. 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  Shallow outpouching right internal carotid artery redemonstrated, sessile atheromatous change.  No intracranial berry aneurysm.   The basilar artery is widely patent with left vertebral dominant.  There is no intracranial flow limiting stenosis of the anterior, middle, or posterior cerebral arteries.  No intracranial berry aneurysm is seen.   Review of the MIP images confirms the above findings.  IMPRESSION:   The previously identified infarct of the posterior limb internal capsule on the left appears slightly larger than that seen on MR 6 x 10 mm.   Correlate clinically.  No areas of acute infarction are seen remote from the primary known insult, and no areas of intracranial vascular occlusion or significant flow limiting stenosis.   Original Report Authenticated By: Staci Righter, M.D.    Ct Angio Neck W/cm &/or Wo/cm  05/02/2012  *RADIOLOGY REPORT*  Clinical Data:  Acute onset of worsening weakness.  CT ANGIOGRAPHY HEAD AND NECK  Technique:  Multidetector CT imaging of the head and neck was performed using the standard protocol during bolus administration of intravenous contrast.  Multiplanar CT image reconstructions including MIPs were obtained to evaluate the vascular anatomy. Carotid stenosis measurements (when applicable) are obtained utilizing NASCET criteria, using the distal internal carotid diameter as the denominator.  Contrast: 1mL OMNIPAQUE IOHEXOL 350 MG/ML SOLN  Comparison:  Most recent CT head 04/30/2012.Also MRI and MRA from 04/28/2012.  CTA NECK  Findings:   Conventional branching of the great vessels from the arch.  No proximal stenosis or vascular occlusion. Mild nonstenotic atheromatous change  at the internal carotid arteries bifurcations bilaterally.  Left vertebral dominant.  No neck masses.  Clear lung apices.  Prior  CABG.   Review of the MIP images confirms the above findings.  IMPRESSION:  There is no flow limiting stenosis or occlusion of the carotid arteries.  Continued dominant left vertebral with patent but chronically small right vertebral.  CTA HEAD  Findings:  Well demarcated hypodensity posterior limb internal capsule on the left correspond to the area of previously identified acute infarction.  Compared to the area of restricted diffusion on 04/27/2012, the area of hypodensity is increased (6 x 10 mm).  No other areas of acute infarction are definitely identified.  There is no supraclinoid internal carotid artery flow limiting stenosis or occlusion. 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  Shallow outpouching right internal carotid artery redemonstrated, sessile atheromatous change.  No intracranial berry aneurysm.   The basilar artery is widely patent with left vertebral dominant.  There is no intracranial flow limiting stenosis of the anterior, middle, or posterior cerebral arteries.  No intracranial berry aneurysm is seen.   Review of the MIP images confirms the above findings.  IMPRESSION:   The previously identified infarct of the posterior limb internal capsule on the left appears slightly larger than that seen on MR 6 x 10 mm.   Correlate clinically.  No areas of acute infarction are seen remote from the primary known insult, and no areas of intracranial vascular occlusion or significant flow limiting stenosis.   Original Report Authenticated By: Staci Righter, M.D.     Basename 05/02/12 0738 05/01/12 0657  WBC 8.5 7.5  HGB 15.1 14.6  HCT 43.7 43.1  PLT 172 166    Basename 05/02/12 0738 05/01/12 0657  NA 140 143  K 3.6 3.4*  CL 104 107  CO2 24 23  GLUCOSE 213* 208*  BUN 14 14  CREATININE 1.06 1.13  CALCIUM 10.3 10.0   CBG (last 3)   Basename 05/02/12 2119 05/02/12 1618 05/02/12 1349  GLUCAP 148* 195* 199*      Wt Readings from Last 3 Encounters:  05/01/12 86.7 kg (191 lb 2.2 oz)  04/28/12 87.8 kg (193 lb 9 oz)  03/09/12 91.7 kg (202 lb 2.6 oz)    Physical Exam:  Nursing note and vitals reviewed.  Constitutional: He is oriented to person, place, and time. He appears well-developed and well-nourished.  HENT:  Head: Normocephalic and atraumatic.  Eyes: Pupils are equal, round, and reactive to light.  Neck: Normal range of motion. Neck supple.  Cardiovascular: Normal rate and regular rhythm. No murmurs or gallops  Pulmonary/Chest: Effort normal and breath sounds normal. No rales or wheezes, no distress  Abdominal: Soft. Bowel sounds are normal. Slightly distended Musculoskeletal: Normal range of motion. He exhibits no edema.  Neurological: He is alert and oriented to person, place, and time.  Able to identify simple objects. Follows all commnads. Speech dysarthric but intellgibile. RUE is 1-2/5, RLE is 1-2/5 also. Sensation 1/2 on right. Right central 7 and tongue deviation  Skin: Skin is warm and dry.    Assessment/Plan: 1. 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 Physiatrist is providing close team supervision and 24 hour management of active medical problems listed below. Physiatrist and rehab team continue to assess barriers to discharge/monitor patient progress  toward functional and medical goals.  Pt with increased weakness on the right and language deficits now. This event will lengthen his stays and change goals likely. Resume therapy today.   FIM: FIM - Bathing Bathing Steps Patient Completed: Chest;Right Arm;Abdomen;Front perineal area;Right upper leg;Left upper leg Bathing: 3: Mod-Patient completes 5-7 44f 10 parts or 50-74%  FIM - Upper Body Dressing/Undressing Upper body dressing/undressing steps patient  completed: Thread/unthread right sleeve of pullover shirt/dresss;Put head through opening of pull over shirt/dress Upper body dressing/undressing: 3: Mod-Patient completed 50-74% of tasks FIM - Lower Body Dressing/Undressing Lower body dressing/undressing steps patient completed: Thread/unthread right underwear leg;Thread/unthread left underwear leg;Pull underwear up/down;Thread/unthread right pants leg;Thread/unthread left pants leg;Pull pants up/down Lower body dressing/undressing: 3: Mod-Patient completed 50-74% of tasks  FIM - Toileting Toileting: 1: Two helpers  FIM - Air cabin crew Transfers: 2-To toilet/BSC: Max A (lift and lower assist);2-From toilet/BSC: Max A (lift and lower assist)  FIM - Engineer, site Assistive Devices: Bed rails;Arm rests Bed/Chair Transfer: 5: Supine > Sit: Supervision (verbal cues/safety issues);5: Sit > Supine: Supervision (verbal cues/safety issues);3: Bed > Chair or W/C: Mod A (lift or lower assist);3: Chair or W/C > Bed: Mod A (lift or lower assist)  FIM - Locomotion: Wheelchair Distance: 150 total A Locomotion: Wheelchair: 1: Total Assistance/staff pushes wheelchair (Pt<25%) FIM - Locomotion: Ambulation Ambulation/Gait Assistance: 1: +1 Total assist Locomotion: Ambulation: 1: Travels less than 50 ft with total assistance/helper does all (Pt.<25%) (without AD; with RW mod A)  Comprehension Comprehension Mode: Auditory Comprehension: 6-Follows complex conversation/direction: With extra time/assistive device  Expression Expression Mode: Verbal Expression: 6-Expresses complex ideas: With extra time/assistive device  Social Interaction Social Interaction: 6-Interacts appropriately with others with medication or extra time (anti-anxiety, antidepressant).  Problem Solving Problem Solving: 6-Solves complex problems: With extra time  Memory Memory: 6-Assistive device: No helper  Medical Problem List and Plan:  1.  DVT Prophylaxis/Anticoagulation: Pharmaceutical: Lovenox  2. Pain Management: N/A  3. Mood: seems appropriate. Ego support by team 4. Neuropsych: This patient is capable of making decisions on his/her own behalf.  5.HTN: monitor with bid checks. Continue Imdur, Prinivil, norvac and coreg. Will not treat spikes with clonidine as drop in bp after clonidine may have helped precipitate extension. Slowly regulate bp with scheduled meds. 6. DM type 2: monitor with bid checks. Resume metformin as studies without contrast and discontinue lantus. Fair control 7. Hypokalemia: low normal on admit labs.  8. Dyslipidemia: continue Lipitor.  9. CAD: continue Lipitor, coreg, Imdur and Prinivil. Now on ASA and plavix due to recent stent/CVA 10. CVA: looking at MRI there appears to be small extension of stroke.(await official reading). Continue asa and plavix per neuro  -avoid overtreatment of bp  -dc fluids and oxygen. Resume activity  -education provided to patient and wife.  LOS (Days) 2 A FACE TO FACE EVALUATION WAS PERFORMED  Babbie Dondlinger T 05/03/2012, 6:43 AM

## 2012-05-03 NOTE — Evaluation (Signed)
Speech Language Pathology Assessment and Plan  Patient Details  Name: [name] MRN: [id number] Date of Birth: 1945/12/18  SLP Diagnosis: Cognitive Impairments;Dysarthria  Rehab Potential: Excellent ELOS: 2 weeks   Today's Date: 05/03/2012 Time: 1000-1055 Time Calculation (min): 55 min  Skilled Therapeutic Intervention: Administered cognitive-linguistic evaluation. Please see below for details.   Problem List:  Patient Active Problem List  Diagnosis  . DM (diabetes mellitus),poorly controlled  . CAD (coronary artery disease), with CABG in 2009 after an MI  . NSTEMI - Occluded SVG-OM, s/p BMS 03/08/12  . Dyslipidemia, (HDL 25)  . Acute ischemic stroke  . Hypertensive emergency  . Hyperglycemia   Past Medical History:  Past Medical History  Diagnosis Date  . Coronary artery disease   . Diabetes mellitus   . Arthritis   . Hypertension   . Hypertensive crisis 03/07/2012  . Unstable angina 03/07/2012  . DM (diabetes mellitus),poorly controlled 03/07/2012  . CAD (coronary artery disease), with CABG in 2009 after an MI 03/07/2012  . Myocardial infarction    Past Surgical History:  Past Surgical History  Procedure Date  . Cardiac surgery   . Coronary artery bypass graft   . Cardiac catheterization     Assessment / Plan / Recommendation Clinical Impression  Patient is a 66 y.o. year old male with recent admission to the hospital on 08/19 with complaints dizziness and right sided weakness. MRI/MRA brain done revealing acute infarct left internal capsule, remote lacunae affecting bilateral brainstem and thalami, no flow limiting stenosis. Patient transferred to CIR on 05/01/2012 and on 05/02/12 had increased right-sided weakness, slurred speech and right facial droop. Cognitive-linguistic evaluation administered today and pt presents with mild dysarthria characterized by low vocal intensity reducing overall intelligibility at the phrase level. Pt also presents with mild  cognitive impairments impacting functional problem solving. Pt would benefit from skilled SLP intervention to maximize speech intelligibility at the conversation level and overall cognitive function to increase functional independence.     SLP Assessment  Patient will need skilled Speech Lanaguage Pathology Services during CIR admission    Recommendations  Follow up Recommendations: None Equipment Recommended: None recommended by SLP    SLP Frequency 1-2 X/day, 30-60 minutes   SLP Treatment/Interventions Cognitive remediation/compensation;Speech/Language facilitation;Cueing hierarchy;Functional tasks;Patient/family education;Therapeutic Activities;Internal/external aids;Environmental controls    Pain No/Denies Pain  Short Term Goals: Week 1: SLP Short Term Goal 1 (Week 1): Pt will utilize an increased vocal intensity with Mod I and be 100% intelligible at the sentence level.  SLP Short Term Goal 2 (Week 1): pt will demonstrate functional problem solving with basic and familair tasks with Mod I.   See FIM for current functional status Refer to Care Plan for Long Term Goals  Recommendations for other services: None  Discharge Criteria: Patient will be discharged from SLP if patient refuses treatment 3 consecutive times without medical reason, if treatment goals not met, if there is a change in medical status, if patient makes no progress towards goals or if patient is discharged from hospital.  The above assessment, treatment plan, treatment alternatives and goals were discussed and mutually agreed upon: by patient  [name] 05/03/2012, 11:36 AM

## 2012-05-03 NOTE — ED Provider Notes (Signed)
Medical screening examination/treatment/procedure(s) were conducted as a shared visit with non-physician practitioner(s) and myself.  I personally evaluated the patient during the encounter  Please see resident note as well as my own.  Pt was under TIA protocol, will require admission as noted.  [name]. Dorna Mai, MD 05/03/12 [phone]

## 2012-05-03 NOTE — Progress Notes (Signed)
Occupational Therapy Session Note  Patient Details  Name: [name] MRN: [id number] Date of Birth: 1945-09-19  Today's Date: 05/03/2012 Time: 1131-1210 Time Calculation (min): 39 min  Short Term Goals: Week 1:  OT Short Term Goal 1 (Week 1): Pt will complete UB dressing with supervision OT Short Term Goal 2 (Week 1): Pt will complete LB dressing with min assist OT Short Term Goal 3 (Week 1): Pt will complete toilet transfer with min assist using LRAD OT Short Term Goal 4 (Week 1): Pt will complete tub/shower transfer with min assist OT Short Term Goal 5 (Week 1): Pt will utilize RUE as diminished level in functional tasks with min cues  Skilled Therapeutic Interventions: ADL-retraining at walk-in shower with emphasis on dynamic sitting and standing balance, use of R-UE at diminished level, transfers, and patient ed on principle of neuromsucular re-ed.   Patient was able to complete transfers, shifting weight from right to left LE without assist or any evidence of LOB, and performed upper body bathing with only min assist to bathe left UE.   Good attention to right noted throughout treatment.    Therapy Documentation Precautions:  Precautions Precautions: Fall Precaution Comments: DM. CAD with stents Restrictions Weight Bearing Restrictions: No  Pain: No report of pain  See FIM for current functional status  Therapy/Group: Individual Therapy  Second session: Time: 1400-1430 Time Calculation (min):  30 min  Pain Assessment: No report of pain  Skilled Therapeutic Interventions: Therapeutic activity with emphasis on general conditioning, strengthening of R-UE, and self-monitoring or exertion.   Patient performed 10 min of arm ergometry on SCIFIT, level 1.2, 30-40 RPM.  Patient reported positive effect as "good stretch" of right UE and rated exertion as 11 (light) on BORG scale.   See FIM for current functional status  Therapy/Group: Individual Therapy   Maryan Puls 05/03/2012, 12:56 PM

## 2012-05-04 ENCOUNTER — Inpatient Hospital Stay (HOSPITAL_COMMUNITY): Payer: Medicaid Other

## 2012-05-04 LAB — GLUCOSE, CAPILLARY
Glucose-Capillary: 160 mg/dL — ABNORMAL HIGH (ref 70–99)
Glucose-Capillary: 136 mg/dL — ABNORMAL HIGH (ref 70–99)

## 2012-05-04 NOTE — Progress Notes (Signed)
BP 168/102, manually.  Clonidine 0.1 mg po given.

## 2012-05-04 NOTE — Progress Notes (Signed)
Occupational Therapy Session Note  Patient Details  Name: [name] MRN: [id number] Date of Birth: 01/18/1946  Today's Date: 05/04/2012 Time: 1300-1355 Time Calculation (min): 55 min  Short Term Goals: Week 1:  OT Short Term Goal 1 (Week 1): Pt will complete UB dressing with supervision OT Short Term Goal 2 (Week 1): Pt will complete LB dressing with min assist OT Short Term Goal 3 (Week 1): Pt will complete toilet transfer with min assist using LRAD OT Short Term Goal 4 (Week 1): Pt will complete tub/shower transfer with min assist OT Short Term Goal 5 (Week 1): Pt will utilize RUE as diminished level in functional tasks with min cues  Skilled Therapeutic Interventions: Therapeutic activities with emphasis on general conditioning, bilateral U/LE exercises, patient ed on weight-shifting and balance, and patient ed on home exercises activities using Nintendo Wii with balance board.   Patient completed 15 min of therex using NuStep, level 1, with OT providing contact guard to maintain right knee in proper position and to provide verbal cues for lateral leans (to left) as needed to maintain posture.   Patient rated exertion as 13 on BORG scale but reported no ill effects.   HR was 120 BPM after activity with good recovery within 3 minutes.   Patient then enjoyed demo of Wii balance board and was able to respond to visual cues to shift his weight during Wii Fit assessment.    Therapy Documentation Precautions:  Precautions Precautions: Fall Precaution Comments: DM. CAD with stents Restrictions Weight Bearing Restrictions: No  Vital Signs: Therapy Vitals Temp: 99 F (37.2 C) Temp src: Oral Pulse Rate: 100  BP: 133/83 mmHg Patient Position, if appropriate: Sitting Oxygen Therapy SpO2: 98 % O2 Device: None (Room air)  See FIM for current functional status  Therapy/Group: Individual Therapy  Maryan Puls 05/04/2012, 3:53 PM

## 2012-05-04 NOTE — Progress Notes (Signed)
BP 110/76, manually.

## 2012-05-04 NOTE — Progress Notes (Signed)
Subjective/Complaints: Slept well. No new problems overnight. Made ithrough therapies yesterday without any issues A 12 point review of systems has been performed and if not noted above is otherwise negative.   Objective: Vital Signs: Blood pressure 110/76, pulse 100, temperature 98.4 F (36.9 C), temperature source Oral, resp. rate 17, height 5\' 8"  (1.727 m), weight 86.7 kg (191 lb 2.2 oz), SpO2 99.00%. Ct Angio Head W/cm &/or Wo Cm  05/02/2012  *RADIOLOGY REPORT*  Clinical Data:  Acute onset of worsening weakness.  CT ANGIOGRAPHY HEAD AND NECK  Technique:  Multidetector CT imaging of the head and neck was performed using the standard protocol during bolus administration of intravenous contrast.  Multiplanar CT image reconstructions including MIPs were obtained to evaluate the vascular anatomy. Carotid stenosis measurements (when applicable) are obtained utilizing NASCET criteria, using the distal internal carotid diameter as the denominator.  Contrast: 70mL OMNIPAQUE IOHEXOL 350 MG/ML SOLN  Comparison:  Most recent CT head 04/30/2012.Also MRI and MRA from 04/28/2012.  CTA NECK  Findings:   Conventional branching of the great vessels from the arch.  No proximal stenosis or vascular occlusion. Mild nonstenotic atheromatous change at the internal carotid arteries bifurcations bilaterally.  Left vertebral dominant.  No neck masses.  Clear lung apices.  Prior CABG.   Review of the MIP images confirms the above findings.  IMPRESSION:  There is no flow limiting stenosis or occlusion of the carotid arteries.  Continued dominant left vertebral with patent but chronically small right vertebral.  CTA HEAD  Findings:  Well demarcated hypodensity posterior limb internal capsule on the left correspond to the area of previously identified acute infarction.  Compared to the area of restricted diffusion on 04/27/2012, the area of hypodensity is increased (6 x 10 mm).  No other areas of acute infarction are definitely  identified.  There is no supraclinoid internal carotid artery flow limiting stenosis or occlusion. 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  Shallow outpouching right internal carotid artery redemonstrated, sessile atheromatous change.  No intracranial berry aneurysm.   The basilar artery is widely patent with left vertebral dominant.  There is no intracranial flow limiting stenosis of the anterior, middle, or posterior cerebral arteries.  No intracranial berry aneurysm is seen.   Review of the MIP images confirms the above findings.  IMPRESSION:   The previously identified infarct of the posterior limb internal capsule on the left appears slightly larger than that seen on MR 6 x 10 mm.   Correlate clinically.  No areas of acute infarction are seen remote from the primary known insult, and no areas of intracranial vascular occlusion or significant flow limiting stenosis.   Original Report Authenticated By: Staci Righter, M.D.    Ct Angio Neck W/cm &/or Wo/cm  05/02/2012  *RADIOLOGY REPORT*  Clinical Data:  Acute onset of worsening weakness.  CT ANGIOGRAPHY HEAD AND NECK  Technique:  Multidetector CT imaging of the head and neck was performed using the standard protocol during bolus administration of intravenous contrast.  Multiplanar CT image reconstructions including MIPs were obtained to evaluate the vascular anatomy. Carotid stenosis measurements (when applicable) are obtained utilizing NASCET criteria, using the distal internal carotid diameter as the denominator.  Contrast: 7mL OMNIPAQUE IOHEXOL 350 MG/ML SOLN  Comparison:  Most recent CT head 04/30/2012.Also MRI and MRA from 04/28/2012.  CTA NECK  Findings:   Conventional branching of the great vessels from the arch.  No proximal stenosis or  vascular occlusion. Mild nonstenotic atheromatous change at the internal carotid arteries bifurcations bilaterally.  Left vertebral  dominant.  No neck masses.  Clear lung apices.  Prior CABG.   Review of the MIP images confirms the above findings.  IMPRESSION:  There is no flow limiting stenosis or occlusion of the carotid arteries.  Continued dominant left vertebral with patent but chronically small right vertebral.  CTA HEAD  Findings:  Well demarcated hypodensity posterior limb internal capsule on the left correspond to the area of previously identified acute infarction.  Compared to the area of restricted diffusion on 04/27/2012, the area of hypodensity is increased (6 x 10 mm).  No other areas of acute infarction are definitely identified.  There is no supraclinoid internal carotid artery flow limiting stenosis or occlusion. 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  Shallow outpouching right internal carotid artery redemonstrated, sessile atheromatous change.  No intracranial berry aneurysm.   The basilar artery is widely patent with left vertebral dominant.  There is no intracranial flow limiting stenosis of the anterior, middle, or posterior cerebral arteries.  No intracranial berry aneurysm is seen.   Review of the MIP images confirms the above findings.  IMPRESSION:   The previously identified infarct of the posterior limb internal capsule on the left appears slightly larger than that seen on MR 6 x 10 mm.   Correlate clinically.  No areas of acute infarction are seen remote from the primary known insult, and no areas of intracranial vascular occlusion or significant flow limiting stenosis.   Original Report Authenticated By: Staci Righter, M.D.    Mr Brain Wo Contrast  05/03/2012  *RADIOLOGY REPORT*  Clinical Data: Worsening right-sided weakness and facial droop.  MRI HEAD WITHOUT CONTRAST  Technique:  Multiplanar, multiecho pulse sequences of the brain and surrounding structures were obtained according to standard protocol without intravenous  contrast.  Comparison: CTA head neck 05/02/2012.  Previous MR head 04/28/2012.  Findings: As suspected from CT, there is considerable progression of the previously identified small area of restricted diffusion involving the left posterior limb internal capsule as seen on prior MR of 04/27/2012.  This area has increased in size from approximately 2 x 4 mm previously, measuring 6 x 10 mm on today's DWI sequence (image 17 series 3).  New subcentimeter areas of restricted diffusion extend more superiorly and medially toward the periventricular white matter (image 18-19 series 3), potentially involving the genu of the internal capsule and more anterior portions of the globus pallidus or lateral thalamic regions. No right-sided infarction is seen.  There is no cortical infarction.  Remainder of the scan is unchanged.  There is no acute hemorrhage or mass effect.  IMPRESSION: Progression of previously identified area of acute infarction as described above. No associated hemorrhage.   Original Report Authenticated By: Staci Righter, M.D.     Basename 05/02/12 0738 05/01/12 0657  WBC 8.5 7.5  HGB 15.1 14.6  HCT 43.7 43.1  PLT 172 166    Basename 05/02/12 0738 05/01/12 0657  NA 140 143  K 3.6 3.4*  CL 104 107  CO2 24 23  GLUCOSE 213* 208*  BUN 14 14  CREATININE 1.06 1.13  CALCIUM 10.3 10.0   CBG (last 3)   Basename 05/03/12 2046 05/03/12 1643 05/03/12 1108  GLUCAP 175* 151* 197*    Wt Readings from Last 3 Encounters:  05/01/12 86.7 kg (191 lb 2.2 oz)  04/28/12 87.8  kg (193 lb 9 oz)  03/09/12 91.7 kg (202 lb 2.6 oz)    Physical Exam:  Nursing note and vitals reviewed.  Constitutional: He is oriented to person, place, and time. He appears well-developed and well-nourished.  HENT:  Head: Normocephalic and atraumatic.  Eyes: Pupils are equal, round, and reactive to light.  Neck: Normal range of motion. Neck supple.  Cardiovascular: Normal rate and regular rhythm. No murmurs or gallops    Pulmonary/Chest: Effort normal and breath sounds normal. No rales or wheezes, no distress  Abdominal: Soft. Bowel sounds are normal. Slightly distended Musculoskeletal: Normal range of motion. He exhibits no edema.  Neurological: He is alert and oriented to person, place, and time.  Able to identify simple objects. Follows all commnads. Speech is low volume but very intelligible. RUE is 1-2/5, RLE is 1-2/5 also. Sensation 1/2 on right. Right central 7 and tongue deviation  Skin: Skin is warm and dry.    Assessment/Plan: 1. 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 Physiatrist is providing close team supervision and 24 hour management of active medical problems listed below. Physiatrist and rehab team continue to assess barriers to discharge/monitor patient progress toward functional and medical goals.  Pt with increased weakness on the right and language deficits now. This event will lengthen his stays and change goals likely. Resume therapy today.   FIM: FIM - Bathing Bathing Steps Patient Completed: Chest;Right Arm;Abdomen;Front perineal area;Buttocks;Right upper leg;Left upper leg;Left lower leg (including foot) Bathing: 4: Min-Patient completes 8-9 49f 10 parts or 75+ percent  FIM - Upper Body Dressing/Undressing Upper body dressing/undressing steps patient completed: Thread/unthread left sleeve of pullover shirt/dress;Put head through opening of pull over shirt/dress Upper body dressing/undressing: 3: Mod-Patient completed 50-74% of tasks FIM - Lower Body Dressing/Undressing Lower body dressing/undressing steps patient completed: Thread/unthread left underwear leg;Pull underwear up/down;Thread/unthread left pants leg Lower body dressing/undressing: 2: Max-Patient completed 25-49% of tasks  FIM -  Toileting Toileting: 1: Two helpers  FIM - Air cabin crew Transfers: 2-To toilet/BSC: Max A (lift and lower assist);2-From toilet/BSC: Max A (lift and lower assist)  FIM - Engineer, site Assistive Devices: Bed rails;Arm rests Bed/Chair Transfer: 5: Supine > Sit: Supervision (verbal cues/safety issues);5: Sit > Supine: Supervision (verbal cues/safety issues);3: Bed > Chair or W/C: Mod A (lift or lower assist);3: Chair or W/C > Bed: Mod A (lift or lower assist)  FIM - Locomotion: Wheelchair Distance: 150 total A Locomotion: Wheelchair: 1: Total Assistance/staff pushes wheelchair (Pt<25%) FIM - Locomotion: Ambulation Ambulation/Gait Assistance: 1: +1 Total assist Locomotion: Ambulation: 1: Travels less than 50 ft with total assistance/helper does all (Pt.<25%) (without AD; with RW mod A)  Comprehension Comprehension Mode: Auditory Comprehension: 6-Follows complex conversation/direction: With extra time/assistive device  Expression Expression Mode: Verbal Expression: 6-Expresses complex ideas: With extra time/assistive device  Social Interaction Social Interaction: 7-Interacts appropriately with others - No medications needed.  Problem Solving Problem Solving: 5-Solves basic 90% of the time/requires cueing < 10% of the time  Memory Memory: 6-More than reasonable amt of time  Medical Problem List and Plan:  1. DVT Prophylaxis/Anticoagulation: Pharmaceutical: Lovenox  2. Pain Management: N/A  3. Mood: seems appropriate. Ego support by team 4. Neuropsych: This patient is capable of making decisions on his/her own behalf.  5.HTN: monitor with bid checks. Continue Imdur, Prinivil, norvac and coreg. Will not treat spikes with clonidine  as drop in bp after clonidine may have helped precipitate extension. Slowly regulate bp with scheduled meds. 6. DM type 2: monitor with bid checks. Resume metformin as studies without contrast and discontinue lantus. Fair  control 7. Hypokalemia: low normal on admit labs.  8. Dyslipidemia: continue Lipitor.  9. CAD: continue Lipitor, coreg, Imdur and Prinivil. Now on ASA and plavix due to recent stent/CVA 10. CVA: extension of previous stroke on MRI. Continue asa and plavix per neuro  -avoid overtreatment of bp  -dc fluids and oxygen. Resumed activity without issues yesterday  -education provided to patient and wife.  LOS (Days) 3 A FACE TO FACE EVALUATION WAS PERFORMED  Celita Aron T 05/04/2012, 6:46 AM

## 2012-05-05 ENCOUNTER — Inpatient Hospital Stay (HOSPITAL_COMMUNITY): Payer: Medicaid Other | Admitting: Physical Therapy

## 2012-05-05 ENCOUNTER — Inpatient Hospital Stay (HOSPITAL_COMMUNITY): Payer: Medicaid Other | Admitting: Occupational Therapy

## 2012-05-05 ENCOUNTER — Inpatient Hospital Stay (HOSPITAL_COMMUNITY): Payer: Medicaid Other | Admitting: Speech Pathology

## 2012-05-05 DIAGNOSIS — Z5189 Encounter for other specified aftercare: Secondary | ICD-10-CM

## 2012-05-05 DIAGNOSIS — I10 Essential (primary) hypertension: Secondary | ICD-10-CM

## 2012-05-05 DIAGNOSIS — I6330 Cerebral infarction due to thrombosis of unspecified cerebral artery: Secondary | ICD-10-CM

## 2012-05-05 LAB — GLUCOSE, CAPILLARY
Glucose-Capillary: 161 mg/dL — ABNORMAL HIGH (ref 70–99)
Glucose-Capillary: 141 mg/dL — ABNORMAL HIGH (ref 70–99)

## 2012-05-05 MED ORDER — CLONIDINE HCL 0.1 MG PO TABS
0.1000 mg | ORAL_TABLET | Freq: Four times a day (QID) | ORAL | Status: DC | PRN
  Administered 2012-05-06 – 2012-05-08 (×2): 0.1 mg via ORAL
  Filled 2012-05-05 (×3): qty 1

## 2012-05-05 NOTE — Evaluation (Signed)
Recreational Therapy Assessment and Plan  Patient Details  Name: [name] MRN: [id number] Date of Birth: 11-Nov-1945 Today's Date: 05/05/2012  Rehab Potential: Good ELOS: 10-12 days   Assessment Clinical Impression: Problem List:  Patient Active Problem List   Diagnosis   .  DM (diabetes mellitus),poorly controlled   .  CAD (coronary artery disease), with CABG in 2009 after an MI   .  NSTEMI - Occluded SVG-OM, s/p BMS 03/08/12   .  Dyslipidemia, (HDL 25)   .  Acute ischemic stroke   .  Hypertensive emergency   .  Hyperglycemia    Past Medical History:  Past Medical History   Diagnosis  Date   .  Coronary artery disease    .  Diabetes mellitus    .  Arthritis    .  Hypertension    .  Hypertensive crisis  03/07/2012   .  Unstable angina  03/07/2012   .  DM (diabetes mellitus),poorly controlled  03/07/2012   .  CAD (coronary artery disease), with CABG in 2009 after an MI  03/07/2012   .  Myocardial infarction     Past Surgical History:  Past Surgical History   Procedure  Date   .  Cardiac surgery    .  Coronary artery bypass graft    .  Cardiac catheterization     Assessment & Plan  Clinical Impression: Patient is a 66 y.o. RH-male with history of poorly controlled DM, CAD with stent 03/07/12; admitted on 08/19 with complaints dizziness and right sided weakness. MRI/MRA brain done revealing acute infarct left internal capsule, remote lacunae affecting bilateral brainstem and thalami, no flow limiting stenosis. 2D echo done revealing EF 30% with diffuse hypokinesis and moderate hypokinesis. Carotid dopplers with intimal wall thickening CCA. Patient's Effient changed to Plavix per input with cardiology. Had worsening of right sided weakness past admission and follow up CCT without evidence of hemorrhage or extension. Patient transferred to CIR on 05/01/2012.   Patient presents with decreased activity tolerance, decreased functional mobility, decreased balance, right sided  weakness limiting pt's independence with leisure/community pursuits.  Leisure History/Participation Premorbid leisure interest/current participation: Petra Kuba - Pension scheme manager - Doctor, hospital - Production designer, theatre/television/film Other Leisure Interests: Television Leisure Participation Style: Alone;With Family/Friends Awareness of Community Resources: Fair-identify 2 post discharge leisure resources Psychosocial / Spiritual Social interaction - Mood/Behavior: Cooperative Academic librarian Appropriate for Education?: Yes Patient Agreeable to [employer]?: Yes Recreational Therapy Orientation Orientation -Reviewed with patient: Available activity resources Strengths/Weaknesses Patient Strengths/Abilities: Willingness to participate Patient weaknesses: Physical limitations  Plan Rec Therapy Plan Is patient appropriate for Therapeutic Recreation?: Yes Rehab Potential: Good Treatment times per week: Min 1 time per week >20 min Estimated Length of Stay: 10-12 days TR Treatment/Interventions: Adaptive equipment instruction;Balance/vestibular training;Cognitive remediation/compensation;Community reintegration;Functional mobility training;Patient/family education;Recreation/leisure participation;Therapeutic activities;Therapeutic exercise;UE/LE Coordination activities  Recommendations for other services: None  Discharge Criteria: Patient will be discharged from TR if patient refuses treatment 3 consecutive times without medical reason.  If treatment goals not met, if there is a change in medical status, if patient makes no progress towards goals or if patient is discharged from hospital.  The above assessment, treatment plan, treatment alternatives and goals were discussed and mutually agreed upon: by patient  [place] 05/05/2012, 2:49 PM

## 2012-05-05 NOTE — Progress Notes (Signed)
Speech Language Pathology Daily Session Note  Patient Details  Name: [name] MRN: [id number] Date of Birth: 10-17-1945  Today's Date: 05/05/2012 Time: 1330-1415 Time Calculation (min): 45 min  Short Term Goals: Week 1: SLP Short Term Goal 1 (Week 1): Pt will utilize an increased vocal intensity with Mod I and be 100% intelligible at the sentence level.  SLP Short Term Goal 2 (Week 1): pt will demonstrate functional problem solving with basic and familair tasks with Mod I.   Skilled Therapeutic Interventions: Treatment session focused on addressing speech intelligibility and cognition.  SLP facilitated session with min assist verbal and non-verbal cues to increase vocal intensity which impacted speech intelligibility during sentence level expression.  Patient also participated in diagnostic treatment of high level cognition by recalling new medications with increased wait time.  Patient initiated discussion regarding discharge planning and medication management system as well as costs of new medications.  SLP facilitated calling pharmacy to price check current medications with setup assist.       FIM:  Comprehension Comprehension Mode: Auditory Comprehension: 6-Follows complex conversation/direction: With extra time/assistive device Expression Expression Mode: Verbal Expression: 6-Expresses complex ideas: With extra time/assistive device Social Interaction Social Interaction: 6-Interacts appropriately with others with medication or extra time (anti-anxiety, antidepressant). Problem Solving Problem Solving: 6-Solves complex problems: With extra time Memory Memory: 6-Assistive device: No helper FIM - Eating Eating Activity: 5: Set-up assist for open containers  Pain Pain Assessment Pain Assessment: No/denies pain  Therapy/Group: Individual Therapy  Carmelia Roller., [place] [id number]  [place] 05/05/2012, 4:14 PM

## 2012-05-05 NOTE — Progress Notes (Signed)
Occupational Therapy Session Note  Patient Details  Name: [name] MRN: [id number] Date of Birth: 1946/03/27  Today's Date: 05/05/2012 Time: 0933-1030 Time Calculation (min): 57 min  Short Term Goals: Week 1:  OT Short Term Goal 1 (Week 1): Pt will complete UB dressing with supervision OT Short Term Goal 2 (Week 1): Pt will complete LB dressing with min assist OT Short Term Goal 3 (Week 1): Pt will complete toilet transfer with min assist using LRAD OT Short Term Goal 4 (Week 1): Pt will complete tub/shower transfer with min assist OT Short Term Goal 5 (Week 1): Pt will utilize RUE as diminished level in functional tasks with min cues  Skilled Therapeutic Interventions/Progress Updates:    Pt seen for ADL retraining with focus on transfers, sit <> stand, dynamic sitting balance, and use of RUE at diminished level and NM re-ed.  Pt completed transfers with mod assist, requiring initial assist for lifting and cues for weight shifting.  Pt with minimal weight bearing through RLE with transfers. Pt completed bathing at overall min assist with light steady assist in standing and assist to wash LUE.  Engaged in [place] re-ed in supine and sitting with focus on shoulder mobility.  Pt demonstrating ability to hold RUE against gravity with minimal assist and cues for extension and shoulder extension against minimal resistance through full ROM. Pt with decreased grasp, but demonstrated ability to grasp item and release.  Therapy Documentation Precautions:  Precautions Precautions: Fall Precaution Comments: DM. CAD with stents Restrictions Weight Bearing Restrictions: No Pain:   Pt with no c/o pain this session.  See FIM for current functional status  Therapy/Group: Individual Therapy  Sim Boast 05/05/2012, 11:23 AM

## 2012-05-05 NOTE — Progress Notes (Signed)
Physical Therapy Session Note  Patient Details  Name: [name] MRN: [id number] Date of Birth: 02/02/1946  Today's Date: 05/05/2012 Time: 1108-1202 and [id number] Time Calculation (min): 54 min and 27 min  Short Term Goals: Week 1:  PT Short Term Goal 1 (Week 1): Patient will perform bed <> w/c transfers consistently to L and R with min A PT Short Term Goal 2 (Week 1): Patient will perform w/c mobility on unit x 150 with L hemi technique and min A PT Short Term Goal 3 (Week 1): Patient will perform gait training on unit x 30' with LRAD and appropriate orthosis on RLE with min A  PT Short Term Goal 4 (Week 1): Patient will perform up and down one step/curb with LRAD and appropriate orthosis with mod A  Skilled Therapeutic Interventions/Progress Updates:   Patient reporting no chang in strength or function since extension of CVA on Friday; reassessment of strength: still 2/5 in RLE.  Gait with RW with hand orthosis x 75' controlled environment with Max A secondary to continued R genu recurvatum, decreased foot clearance, decrease hip and knee flexion, decreased lateral weight shift to L, R lateral trunk flexion and pelvic retraction, and RLE ADD.  Completed Berg balance assessment and discussed falls risk with patient and wife.  W/c mobility training in controlled environment x 150' with L hemi technique with supervision and intermittent cues for propulsion sequence and intermittent rest breaks secondary to SOB and LE fatigue.  PM session: Gait training with focus on lateral weight shifting with L and R lateral stepping with UE support on wall rail with facilitation for RLE hip ABD<>ADD, activation of RLE extensors in stance for stabilization and full R lateral weight shift with upright trunk x 25' each direction.  In // bars performed RLE extensor/stance training with verbal and tactile cues for activation during LLE forward, retro stepping with bilat UE support; RLE terminal hip extension  and initial swing phase training with R foot on maxi slide on floor to minimize friction with cues for isolated hip and knee flexion for initial swing phase with cues to minimize substitution of trunk extension and activation of glutes and hamstring for hip extension while minimizing substitution of trunk flexion with min A  Therapy Documentation Precautions:  Precautions Precautions: Fall Precaution Comments: DM. CAD with stents Restrictions Weight Bearing Restrictions: No Vital Signs:  144/104, HR 91 at beginning of session; 153/96 HR: 90 after completing the BERG Pain:  No c/o pain Balance: Standardized Balance Assessment Standardized Balance Assessment: Berg Balance Test Berg Balance Test Sit to Stand: Needs moderate or maximal assist to stand Standing Unsupported: Able to stand 2 minutes with supervision Sitting with Back Unsupported but Feet Supported on Floor or Stool: Able to sit safely and securely 2 minutes Stand to Sit: Controls descent by using hands Transfers: Needs one person to assist Standing Unsupported with Eyes Closed: Able to stand 10 seconds with supervision Standing Ubsupported with Feet Together: Needs help to attain position and unable to hold for 15 seconds From Standing, Reach Forward with Outstretched Arm: Reaches forward but needs supervision From Standing Position, Pick up Object from Floor: Able to pick up shoe, needs supervision From Standing Position, Turn to Look Behind Over each Shoulder: Needs supervision when turning Turn 360 Degrees: Needs assistance while turning Standing Unsupported, Alternately Place Feet on Step/Stool: Needs assistance to keep from falling or unable to try Standing Unsupported, One Foot in Front: Loses balance while stepping or standing Standing on One  Leg: Tries to lift leg/unable to hold 3 seconds but remains standing independently Total Score: 20  Patient demonstrates increased fall risk as noted by score of  20/56 on Berg  Balance Scale.  (<36= high risk for falls, close to 100%; 37-45 significant >80%; 46-51 moderate >50%; 52-55 lower >25%)  See FIM for current functional status  Therapy/Group: Individual Therapy  Raylene Everts [facility] 05/05/2012, 12:56 PM

## 2012-05-05 NOTE — Progress Notes (Signed)
Patient ID: [name], male   DOB: 1945-11-05, 66 y.o.   MRN: [id number] Subjective/Complaints: No further hypotensive episodes A 12 point review of systems has been performed and if not noted above is otherwise negative.   Objective: Vital Signs: Blood pressure 160/100, pulse 87, temperature 98.2 F (36.8 C), temperature source Oral, resp. rate 18, height 5\' 8"  (1.727 m), weight 86.7 kg (191 lb 2.2 oz), SpO2 100.00%. No results found. No results found for this basename: WBC:2,HGB:2,HCT:2,PLT:2 in the last 72 hours No results found for this basename: NA:2,K:2,CL:2,CO2:2,GLUCOSE:2,BUN:2,CREATININE:2,CALCIUM:2 in the last 72 hours CBG (last 3)   Basename 05/05/12 0754 05/04/12 2203 05/04/12 1617  GLUCAP 161* 114* 136*    Wt Readings from Last 3 Encounters:  05/01/12 86.7 kg (191 lb 2.2 oz)  04/28/12 87.8 kg (193 lb 9 oz)  03/09/12 91.7 kg (202 lb 2.6 oz)    Physical Exam:  Nursing note and vitals reviewed.  Constitutional: He is oriented to person, place, and time. He appears well-developed and well-nourished.  HENT:  Head: Normocephalic and atraumatic.  Eyes: Pupils are equal, round, and reactive to light.  Neck: Normal range of motion. Neck supple.  Cardiovascular: Normal rate and regular rhythm. No murmurs or gallops  Pulmonary/Chest: Effort normal and breath sounds normal. No rales or wheezes, no distress  Abdominal: Soft. Bowel sounds are normal. Slightly distended Musculoskeletal: Normal range of motion. He exhibits no edema.  Neurological: He is alert and oriented to person, place, and time.  Able to identify simple objects. Follows all commnads. Speech is low volume but very intelligible. RUE is 1-2/5, RLE is 1-2/5 also. Sensation 1/2 on right. Right central 7 and tongue deviation  Skin: Skin is warm and dry.    Assessment/Plan: 1. 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 Physiatrist is providing close team supervision and 24 hour management of active medical problems listed below. Physiatrist and rehab team continue to assess barriers to discharge/monitor patient progress toward functional and medical goals.  Pt with increased weakness on the right and language deficits now. This event will lengthen his stays and change goals likely. Resume therapy today.   FIM: FIM - Bathing Bathing Steps Patient Completed: Chest;Right Arm;Abdomen;Front perineal area;Buttocks;Right upper leg;Left upper leg;Left lower leg (including foot) Bathing: 4: Min-Patient completes 8-9 61f 10 parts or 75+ percent  FIM - Upper Body Dressing/Undressing Upper body dressing/undressing steps patient completed: Thread/unthread left sleeve of pullover shirt/dress;Put head through opening of pull over shirt/dress Upper body dressing/undressing: 3: Mod-Patient completed 50-74% of tasks FIM - Lower Body Dressing/Undressing Lower body dressing/undressing steps patient completed: Thread/unthread left underwear leg;Pull underwear up/down;Thread/unthread left pants leg Lower body dressing/undressing: 2: Max-Patient completed 25-49% of tasks  FIM - Toileting Toileting: 1: Two helpers  FIM - Air cabin crew Transfers: 2-To toilet/BSC: Max A (lift and lower assist);2-From toilet/BSC: Max A (lift and lower assist)  FIM - Engineer, site Assistive Devices: Bed rails;Arm rests Bed/Chair Transfer: 5: Supine > Sit: Supervision (verbal cues/safety issues);5: Sit > Supine: Supervision (verbal cues/safety issues);3: Bed > Chair or W/C: Mod A (lift or lower assist);3: Chair or W/C > Bed: Mod A (lift or lower assist)  FIM - Locomotion: Wheelchair Distance: 150 total A Locomotion: Wheelchair: 1: Total Assistance/staff pushes wheelchair (Pt<25%) FIM - Locomotion: Ambulation Ambulation/Gait  Assistance: 1: +1 Total assist Locomotion: Ambulation: 1: Travels less than  52 ft with total assistance/helper does all (Pt.<25%) (without AD; with RW mod A)  Comprehension Comprehension Mode: Auditory Comprehension: 6-Follows complex conversation/direction: With extra time/assistive device  Expression Expression Mode: Verbal Expression: 6-Expresses complex ideas: With extra time/assistive device  Social Interaction Social Interaction: 6-Interacts appropriately with others with medication or extra time (anti-anxiety, antidepressant).  Problem Solving Problem Solving: 6-Solves complex problems: With extra time  Memory Memory: 6-Assistive device: No helper  Medical Problem List and Plan:  1. DVT Prophylaxis/Anticoagulation: Pharmaceutical: Lovenox  2. Pain Management: N/A  3. Mood: seems appropriate. Ego support by team 4. Neuropsych: This patient is capable of making decisions on his/her own behalf.  5.HTN: monitor with bid checks. Continue Imdur, Prinivil, norvac and coreg. Will not treat spikes with clonidine as drop in bp after clonidine may have helped precipitate extension. Slowly regulate bp with scheduled meds. 6. DM type 2: monitor with bid checks. Resume metformin as studies without contrast and discontinue lantus. Fair control 7. Hypokalemia: low normal on admit labs.  8. Dyslipidemia: continue Lipitor.  9. CAD: continue Lipitor, coreg, Imdur and Prinivil. Now on ASA and plavix due to recent stent/CVA 10. CVA: extension of previous stroke on MRI. Continue asa and plavix per neuro  -avoid overtreatment of bp  -dc fluids and oxygen. Resumed activity without issues yesterday  -education provided to patient and wife.  LOS (Days) 4 A FACE TO FACE EVALUATION WAS PERFORMED  Naylah Cork E 05/05/2012, 8:11 AM

## 2012-05-06 ENCOUNTER — Inpatient Hospital Stay (HOSPITAL_COMMUNITY): Payer: Self-pay | Admitting: Occupational Therapy

## 2012-05-06 ENCOUNTER — Inpatient Hospital Stay (HOSPITAL_COMMUNITY): Payer: Medicaid Other | Admitting: Speech Pathology

## 2012-05-06 ENCOUNTER — Inpatient Hospital Stay (HOSPITAL_COMMUNITY): Payer: Medicaid Other | Admitting: Occupational Therapy

## 2012-05-06 ENCOUNTER — Inpatient Hospital Stay (HOSPITAL_COMMUNITY): Payer: Medicaid Other | Admitting: Physical Therapy

## 2012-05-06 LAB — GLUCOSE, CAPILLARY
Glucose-Capillary: 117 mg/dL — ABNORMAL HIGH (ref 70–99)
Glucose-Capillary: 160 mg/dL — ABNORMAL HIGH (ref 70–99)
Glucose-Capillary: 170 mg/dL — ABNORMAL HIGH (ref 70–99)

## 2012-05-06 NOTE — Progress Notes (Signed)
Speech Language Pathology Daily Session Note  Patient Details  Name: [name] MRN: [id number] Date of Birth: 04/24/1946  Today's Date: 05/06/2012 Time: 1405-1430 Time Calculation (min): 25 min  Short Term Goals: Week 1: SLP Short Term Goal 1 (Week 1): Pt will utilize an increased vocal intensity with Mod I and be 100% intelligible at the sentence level.  SLP Short Term Goal 2 (Week 1): pt will demonstrate functional problem solving with basic and familair tasks with Mod I.   Skilled Therapeutic Interventions: Treatment session focused on addressing speech intelligibility; SLP facilitated session by providing patient with sequence cards to provide a structured activity which resulted in modified independent speech intelligibility.  SLP also facilitated session with spontaneous conversational level speech which resulted in modified independent verbal expression.     FIM:  Comprehension Comprehension Mode: Auditory Comprehension: 6-Follows complex conversation/direction: With extra time/assistive device Expression Expression Mode: Verbal Expression: 6-Expresses complex ideas: With extra time/assistive device Social Interaction Social Interaction: 6-Interacts appropriately with others with medication or extra time (anti-anxiety, antidepressant). Problem Solving Problem Solving: 6-Solves complex problems: With extra time Memory Memory: 6-Assistive device: No helper  Pain Pain Assessment Pain Assessment: No/denies pain  Therapy/Group: Individual Therapy  Carmelia Roller., [place]  [place] 05/06/2012, 4:57 PM

## 2012-05-06 NOTE — Progress Notes (Signed)
Kyle Mesi, PA notified of patient's BP 180/110 this AM,  Order received:  Give 0800 BP meds now.  Lisinopril 20 mg and Coreg 12.5 mg po given.  Norvasc not available at this time, but ordered from pharmacy.

## 2012-05-06 NOTE — Progress Notes (Signed)
Occupational Therapy Session Note  Patient Details  Name: [name] MRN: [id number] Date of Birth: 1946-07-12  Today's Date: 05/06/2012 Time: 0930-1030 and [id number] Time Calculation (min): 60 min and 40 min  Short Term Goals: Week 1:  OT Short Term Goal 1 (Week 1): Pt will complete UB dressing with supervision OT Short Term Goal 2 (Week 1): Pt will complete LB dressing with min assist OT Short Term Goal 3 (Week 1): Pt will complete toilet transfer with min assist using LRAD OT Short Term Goal 4 (Week 1): Pt will complete tub/shower transfer with min assist OT Short Term Goal 5 (Week 1): Pt will utilize RUE as diminished level in functional tasks with min cues  Skilled Therapeutic Interventions/Progress Updates:    1) Pt seated in w/c at sink upon arrival.  Pt reports his wife bathing him this AM.  Pt's wife present, encouraged her to allow him to bathe and dress with therapy to focus on various strategies and improved independence.  Pt reports needing to wash periarea.  Completed bathing in standing at sink with min/steady assist in standing.  Pt required initial cue for hand placement on w/c and weight shifting for sit to stand, pt able to recall this method during further sit to stand this session.  Pt completed dressing with assist to don shirt fully on both arms secondary to weakness and IV line in Lt arm.  Use of step stool to assist with donning underwear and pants to decrease SOB with bending forward.  NM re-ed in supine, sidelying, and sitting with focus on shoulder flexion, elbow extension, and grasp/release.  Pt with improved shoulder activation in sidelying this session.  Focus on trunk stability with movements and focusing on joint isolation.  Pt with decreased ability to maintain grasp and couple that movement with another movement at this time.  2) 1:1 OT with focus on forced use of RUE with grasp/release, horizontal abduction/adduction, standing balance, sit <> stand, and  transfers.  Pt with improved carryover with technique for sit to stand with forward weight shifting and use of BLE to push up instead of pull up with UEs.  Engaged in grasp/release activity in sitting and standing with hand over hand assist to increase success with supination/pronation while pt focused on grasp/release.  Provided tactile input at Rt knee in standing to prevent knee buckling.  Engaged in standing activity approx 15 mins with 1 rest break.  Sit to stand improved from mod assist to light min/steadying assist throughout session.  Therapy Documentation Precautions:  Precautions Precautions: Fall Precaution Comments: DM. CAD with stents Restrictions Weight Bearing Restrictions: No General:   Vital Signs: Therapy Vitals Pulse Rate: 91  BP: 143/96 AM, 116/80 PM Patient Position, if appropriate: Sitting Oxygen Therapy SpO2: 100 % Pain: Pain Assessment Pain Assessment: No/denies pain Pain Score: 0-No pain  See FIM for current functional status  Therapy/Group: Individual Therapy  Sim Boast 05/06/2012, 1:43 PM

## 2012-05-06 NOTE — Progress Notes (Signed)
Physical Therapy Session Note  Patient Details  Name: [name] MRN: [id number] Date of Birth: June 09, 1946  Today's Date: 05/06/2012 Time: 1105-1200 Time Calculation (min): 55 min  Short Term Goals: Week 1:  PT Short Term Goal 1 (Week 1): Patient will perform bed <> w/c transfers consistently to L and R with min A PT Short Term Goal 2 (Week 1): Patient will perform w/c mobility on unit x 150 with L hemi technique and min A PT Short Term Goal 3 (Week 1): Patient will perform gait training on unit x 34' with LRAD and appropriate orthosis on RLE with min A  PT Short Term Goal 4 (Week 1): Patient will perform up and down one step/curb with LRAD and appropriate orthosis with mod A  Therapy Documentation Precautions:  Precautions Precautions: Fall Precaution Comments: DM. CAD with stents Restrictions Weight Bearing Restrictions: No Vital Signs: Therapy Vitals Pulse Rate: 91  BP: 163/109 mmHg (after therapy session) Patient Position, if appropriate: Sitting Oxygen Therapy SpO2: 100 % Pain: Pain Assessment Pain Assessment: No/denies pain Pain Score: 0-No pain  Other Treatments: Treatments Therapeutic Activity: Patient performed w/c mobility on unit x 150' in controlled environment with supervision with intermittent rest breaks secondary to fatigue; patient able to set up w/c 80% of the time with just verbal cues to prepare for transfers.  Performed transfers w/c <> mat with squat pivot and use of arm rest with mod-max A and verbal cues for sequencing and cues to advance and pivot on R foot.  Transfer mat > Kinetron with RW and mod A still with increased difficulty with L lateral weight shift to advance RLE.  Before beginning weight shift training on Kinetron assessed BP which was elevated; RN notified and patient returned to mat in reclined position to work on [employer] coordination exercises.  Sit <> supine with min-mod A overall with assistance to bring RLE up on to bed and for  rolling sequence for supine > sit.  Patient reporting dizziness at end of session; BP repeated and RN alerted.   Neuromuscular Facilitation: Right;Lower Extremity;Activity to increase coordination;Activity to increase motor control;Activity to increase timing and sequencing;Activity to increase sustained activation with supine Lennette Bihari exercises with intermittent rest breaks and mod A overall for hip and knee concentric and eccentric motor control  See FIM for current functional status  Therapy/Group: Individual Therapy  Raylene Everts [facility] 05/06/2012, 12:22 PM

## 2012-05-06 NOTE — Progress Notes (Signed)
Patient ID: [name], male   DOB: [birth year], 66 y.o.   MRN: [id number] Subjective/Complaints: No further hypotensive episodes A 12 point review of systems has been performed and if not noted above is otherwise negative.   Objective: Vital Signs: Blood pressure 180/110, pulse 92, temperature 98.3 F (36.8 C), temperature source Oral, resp. rate 18, height 5\' 8"  (1.727 m), weight 87.4 kg (192 lb 10.9 oz), SpO2 99.00%. No results found. No results found for this basename: WBC:2,HGB:2,HCT:2,PLT:2 in the last 72 hours No results found for this basename: NA:2,K:2,CL:2,CO2:2,GLUCOSE:2,BUN:2,CREATININE:2,CALCIUM:2 in the last 72 hours CBG (last 3)   Basename 05/06/12 0014 05/05/12 2207 05/05/12 1710  GLUCAP 170* 79 158*    Wt Readings from Last 3 Encounters:  05/06/12 87.4 kg (192 lb 10.9 oz)  04/28/12 87.8 kg (193 lb 9 oz)  03/09/12 91.7 kg (202 lb 2.6 oz)    Physical Exam:  Nursing note and vitals reviewed.  Constitutional: He is oriented to person, place, and time. He appears well-developed and well-nourished.  HENT:  Head: Normocephalic and atraumatic.  Eyes: Pupils are equal, round, and reactive to light.  Neck: Normal range of motion. Neck supple.  Cardiovascular: Normal rate and regular rhythm. No murmurs or gallops  Pulmonary/Chest: Effort normal and breath sounds normal. No rales or wheezes, no distress  Abdominal: Soft. Bowel sounds are normal. Slightly distended Musculoskeletal: Normal range of motion. He exhibits no edema.  Neurological: He is alert and oriented to person, place, and time.  Able to identify simple objects. Follows all commnads. Speech is low volume but very intelligible. RUE is 1-2/5, RLE is 1-2/5 also. Sensation 1/2 on right. Right central 7 and tongue deviation  Skin: Skin is warm and dry.    Assessment/Plan: 1. 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 Physiatrist is providing close team supervision and 24 hour management of active medical problems listed below. Physiatrist and rehab team continue to assess barriers to discharge/monitor patient progress toward functional and medical goals.  Pt with increased weakness on the right and language deficits now. This event will lengthen his stays and change goals likely. Resume therapy today.   FIM: FIM - Bathing Bathing Steps Patient Completed: Chest;Right Arm;Abdomen;Front perineal area;Right upper leg;Left upper leg Bathing: 3: Mod-Patient completes 5-7 31f 10 parts or 50-74%  FIM - Upper Body Dressing/Undressing Upper body dressing/undressing steps patient completed: Put head through opening of pull over shirt/dress;Pull shirt over trunk Upper body dressing/undressing: 3: Mod-Patient completed 50-74% of tasks FIM - Lower Body Dressing/Undressing Lower body dressing/undressing steps patient completed: Thread/unthread right underwear leg;Thread/unthread left underwear leg;Pull underwear up/down;Thread/unthread right pants leg;Thread/unthread left pants leg;Pull pants up/down;Don/Doff right sock;Don/Doff left sock;Don/Doff left shoe Lower body dressing/undressing: 4: Min-Patient completed 75 plus % of tasks  FIM - Toileting Toileting: 1: Two helpers  FIM - Air cabin crew Transfers: 2-To toilet/BSC: Max A (lift and lower assist);2-From toilet/BSC: Max A (lift and lower assist)  FIM - Engineer, site Assistive Devices: Bed rails;Arm rests Bed/Chair Transfer: 5: Supine > Sit: Supervision (verbal cues/safety issues);5: Sit > Supine: Supervision (verbal cues/safety issues);3: Bed > Chair or W/C: Mod A (lift or lower assist);3: Chair or W/C > Bed: Mod A (lift or lower assist)  FIM - Locomotion: Wheelchair Distance: 150 Locomotion: Wheelchair: 5: Travels 150 ft or more: maneuvers on  rugs and over door sills with supervision,  cueing or coaxing FIM - Locomotion: Ambulation Locomotion: Ambulation Assistive Devices: Walker - Rolling Ambulation/Gait Assistance: 2: Max assist Locomotion: Ambulation: 2: Travels 50 - 149 ft with maximal assistance (Pt: 25 - 49%)  Comprehension Comprehension Mode: Auditory Comprehension: 6-Follows complex conversation/direction: With extra time/assistive device  Expression Expression Mode: Verbal Expression: 6-Expresses complex ideas: With extra time/assistive device  Social Interaction Social Interaction: 6-Interacts appropriately with others with medication or extra time (anti-anxiety, antidepressant).  Problem Solving Problem Solving: 6-Solves complex problems: With extra time  Memory Memory: 6-Assistive device: No helper  Medical Problem List and Plan:  1. DVT Prophylaxis/Anticoagulation: Pharmaceutical: Lovenox  2. Pain Management: N/A  3. Mood: seems appropriate. Ego support by team 4. Neuropsych: This patient is capable of making decisions on his/her own behalf.  5.HTN: monitor with bid checks. Continue Imdur, Prinivil, norvac and coreg. Will not treat spikes with clonidine as drop in bp after clonidine may have helped precipitate extension. Slowly regulate bp with scheduled meds. 6. DM type 2: monitor with bid checks. Resume metformin as studies without contrast and discontinue lantus. Fair control 7. Hypokalemia: low normal on admit labs.  8. Dyslipidemia: continue Lipitor.  9. CAD: continue Lipitor, coreg, Imdur and Prinivil. Now on ASA and plavix due to recent stent/CVA 10. CVA: extension of previous stroke on MRI. Speech has improved but still has more weakness on R side  Continue asa and plavix per neuro  -avoid overtreatment of bp    -education provided to patient and wife.  LOS (Days) 5 A FACE TO FACE EVALUATION WAS PERFORMED  KIRSTEINS,ANDREW E 05/06/2012, 7:54 AM

## 2012-05-07 ENCOUNTER — Inpatient Hospital Stay (HOSPITAL_COMMUNITY): Payer: Medicaid Other | Admitting: Speech Pathology

## 2012-05-07 ENCOUNTER — Inpatient Hospital Stay (HOSPITAL_COMMUNITY): Payer: Medicaid Other | Admitting: Physical Therapy

## 2012-05-07 ENCOUNTER — Inpatient Hospital Stay (HOSPITAL_COMMUNITY): Payer: Medicaid Other | Admitting: Occupational Therapy

## 2012-05-07 LAB — GLUCOSE, CAPILLARY
Glucose-Capillary: 143 mg/dL — ABNORMAL HIGH (ref 70–99)
Glucose-Capillary: 116 mg/dL — ABNORMAL HIGH (ref 70–99)

## 2012-05-07 NOTE — Progress Notes (Signed)
Occupational Therapy Session Note  Patient Details  Name: JAHZIR CORDIAL MRN: [id number] Date of Birth: [birth year]  Today's Date: 05/07/2012 Time: [id number] Time Calculation (min): 42 min  Short Term Goals: Week 1:  OT Short Term Goal 1 (Week 1): Pt will complete UB dressing with supervision OT Short Term Goal 2 (Week 1): Pt will complete LB dressing with min assist OT Short Term Goal 3 (Week 1): Pt will complete toilet transfer with min assist using LRAD OT Short Term Goal 4 (Week 1): Pt will complete tub/shower transfer with min assist OT Short Term Goal 5 (Week 1): Pt will utilize RUE as diminished level in functional tasks with min cues  Skilled Therapeutic Interventions/Progress Updates:    Pt seen for ADL retraining with focus on functional ambulation in room and bathroom environment with min-mod assist for balance and cues for step length.  Completed bathing at walk-in shower level with focus on increased dynamic standing balance and use of DME to increase safety and energy conservation.  Pt completed dressing at sit to stand level with close supervision-light min/steady assist while in standing.  Pt requires assist to tie shoes, plan to issue pt elastic shoelaces to increase independence.  Grooming completed in standing with light min/steady assist.  Therapy Documentation Precautions:  Precautions Precautions: Fall Precaution Comments: DM. CAD with stents Restrictions Weight Bearing Restrictions: No Pain:   Pt with no c/o pain this session.  See FIM for current functional status  Therapy/Group: Individual Therapy  Sim Boast 05/07/2012, 10:44 AM

## 2012-05-07 NOTE — Progress Notes (Signed)
Speech Language Pathology Daily Session Note & Discharge Summary  Patient Details  Name: AANSH NORDMANN MRN: [id number] Date of Birth: 1946-08-26  Today's Date: 05/07/2012 Time: [id number] Time Calculation (min): 40 min  Short Term Goals: Week 1: SLP Short Term Goal 1 (Week 1): Pt will utilize an increased vocal intensity with Mod I and be 100% intelligible at the sentence level.  SLP Short Term Goal 1 - Progress (Week 1): Met SLP Short Term Goal 2 (Week 1): pt will demonstrate functional problem solving with basic and familair tasks with Mod I.  SLP Short Term Goal 2 - Progress (Week 1): Met  Skilled Therapeutic Interventions: Treatment session focused on addressing speech intelligibility and cognition; SLP facilitated session with a noisy environment with distractions present and patient was able to adjust vocal intensity as needed to verbally express wants and needs with occasional cues.  Wife and patient report that his baseline is low vocal intensity.  Patient was able to recall current medications in previous session and has returned demonstration of skills taught by OT/PT.  Patient's goals are met and as a result it is recommended that the patient be discharge from SLP services because there is no further need.  Wife and patient are in agreement.    FIM:  Comprehension Comprehension Mode: Auditory Comprehension: 6-Follows complex conversation/direction: With extra time/assistive device Expression Expression Mode: Verbal Expression: 6-Expresses complex ideas: With extra time/assistive device Social Interaction Social Interaction: 6-Interacts appropriately with others with medication or extra time (anti-anxiety, antidepressant). Problem Solving Problem Solving: 6-Solves complex problems: With extra time Memory Memory: 6-Assistive device: No helper  Pain Pain Assessment Pain Assessment: No/denies pain  Therapy/Group: Individual Therapy   Speech Language Pathology  Discharge Summary  Patient Details  Name: LASHON DOBIAS MRN: [id number] Date of Birth: Oct 05, 1945  Today's Date: 05/07/2012  Patient has met 2 of 2 long term goals.  Patient to discharge at overall Modified Independent;Supervision level.  Reasons goals not met: n/a   Clinical Impression/Discharge Summary: Patient presents with no further need for skilled SLP services due to modified independence with basic problem solving and supervision with complex tasks (baseline) and only requires occasional cues for increased vocal  intensity given loud environments.  Patient and wife confirm that he is at baseline function and requires no further SLP intervention.   Care Partner:  Caregiver Able to Provide Assistance: Yes  Type of Caregiver Assistance: Cognitive (supervision with complex-financial management)  Recommendation:  Other (comment) (defer to OT/PT based upon mobility outcomes)      Equipment: none   Reasons for discharge: Treatment goals met   Patient/Family Agrees with Progress Made and Goals Achieved: Yes   See FIM for current functional status  Carmelia Roller., CCC-SLP [id number]  [place] 05/07/2012, 5:25 PM

## 2012-05-07 NOTE — Progress Notes (Signed)
Social Work Patient ID: [name], male   DOB: 05/12/1946, 66 y.o.   MRN: [id number] Met with pt and wife to inform team conference goals-supervision-mod/i level and discharge 9/11.  Both are pleased with his progress, pt would like to be at a higher level now.  He has high Expectations of himself.  Will pursue status of disability and Medicaid applications.  Wife has been here and observed in therapies.  Will work toward discharge, pleased with speech discharging due  To back to baseline function.

## 2012-05-07 NOTE — Progress Notes (Signed)
Patient ID: YOHAAN THEILE, male   DOB: 11-Feb-1946, 66 y.o.   MRN: [id number] Subjective/Complaints: No further hypotensive episodes. Elevated BP this am No pain , no c/os A 12 point review of systems has been performed and if not noted above is otherwise negative.   Objective: Vital Signs: Blood pressure 164/116, pulse 94, temperature 98.3 F (36.8 C), temperature source Oral, resp. rate 19, height 5\' 8"  (1.727 m), weight 87.4 kg (192 lb 10.9 oz), SpO2 98.00%. No results found. No results found for this basename: WBC:2,HGB:2,HCT:2,PLT:2 in the last 72 hours No results found for this basename: NA:2,K:2,CL:2,CO2:2,GLUCOSE:2,BUN:2,CREATININE:2,CALCIUM:2 in the last 72 hours CBG (last 3)   Basename 05/07/12 0723 05/06/12 2028 05/06/12 1708  GLUCAP 143* 153* 117*    Wt Readings from Last 3 Encounters:  05/06/12 87.4 kg (192 lb 10.9 oz)  04/28/12 87.8 kg (193 lb 9 oz)  03/09/12 91.7 kg (202 lb 2.6 oz)    Physical Exam:  Nursing note and vitals reviewed.  Constitutional: He is oriented to person, place, and time. He appears well-developed and well-nourished.  HENT:  Head: Normocephalic and atraumatic.  Eyes: Pupils are equal, round, and reactive to light.  Neck: Normal range of motion. Neck supple.  Cardiovascular: Normal rate and regular rhythm. No murmurs or gallops  Pulmonary/Chest: Effort normal and breath sounds normal. No rales or wheezes, no distress  Abdominal: Soft. Bowel sounds are normal. Slightly distended Musculoskeletal: Normal range of motion. He exhibits no edema.  Neurological: He is alert and oriented to person, place, and time.  Able to identify simple objects. Follows all commnads. Speech is low volume but very intelligible. RUE is 1-2/5, RLE is 1-2/5 also. Sensation 1/2 on right. Right central 7 and tongue deviation  Skin: Skin is warm and dry.    Assessment/Plan: 1. 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 Physiatrist is providing close team supervision and 24 hour management of active medical problems listed below. Physiatrist and rehab team continue to assess barriers to discharge/monitor patient progress toward functional and medical goals. Team conference   FIM: FIM - Bathing Bathing Steps Patient Completed: Chest;Right Arm;Abdomen;Front perineal area;Right upper leg;Left upper leg Bathing: 3: Mod-Patient completes 5-7 4f 10 parts or 50-74%  FIM - Upper Body Dressing/Undressing Upper body dressing/undressing steps patient completed: Put head through opening of pull over shirt/dress;Pull shirt over trunk Upper body dressing/undressing: 3: Mod-Patient completed 50-74% of tasks FIM - Lower Body Dressing/Undressing Lower body dressing/undressing steps patient completed: Thread/unthread right underwear leg;Thread/unthread left underwear leg;Pull underwear up/down;Thread/unthread right pants leg;Thread/unthread left pants leg;Pull pants up/down;Don/Doff right sock;Don/Doff left sock;Don/Doff left shoe Lower body dressing/undressing: 4: Min-Patient completed 75 plus % of tasks  FIM - Toileting Toileting: 1: Two helpers  FIM - Air cabin crew Transfers: 2-To toilet/BSC: Max A (lift and lower assist);2-From toilet/BSC: Max A (lift and lower assist)  FIM - Engineer, site Assistive Devices: Arm rests Bed/Chair Transfer: 3: Supine > Sit: Mod A (lifting assist/Pt. 50-74%/lift 2 legs;4: Sit > Supine: Min A (steadying pt. > 75%/lift 1 leg);2: Bed > Chair or W/C: Max A (lift and lower assist);3: Chair or W/C > Bed: Mod A (lift or lower assist)  FIM - Locomotion: Wheelchair Distance: 150 feet Locomotion: Wheelchair: 5: Travels 150 ft or more: maneuvers on rugs and over door sills with supervision, cueing or coaxing FIM - Locomotion:  Ambulation  Locomotion: Ambulation Assistive Devices: Walker - Rolling;Orthosis Ambulation/Gait Assistance: 3: Mod assist Locomotion: Ambulation: 1: Travels less than 50 ft with moderate assistance (Pt: 50 - 74%)  Comprehension Comprehension Mode: Auditory Comprehension: 5-Understands complex 90% of the time/Cues < 10% of the time  Expression Expression Mode: Verbal Expression: 5-Expresses basic 90% of the time/requires cueing < 10% of the time.  Social Interaction Social Interaction: 5-Interacts appropriately 90% of the time - Needs monitoring or encouragement for participation or interaction.  Problem Solving Problem Solving: 5-Solves basic 90% of the time/requires cueing < 10% of the time  Memory Memory: 6-Assistive device: No helper  Medical Problem List and Plan:  1. DVT Prophylaxis/Anticoagulation: Pharmaceutical: Lovenox  2. Pain Management: N/A  3. Mood: seems appropriate. Ego support by team 4. Neuropsych: This patient is capable of making decisions on his/her own behalf.  5.HTN: monitor with bid checks. Continue Imdur, Prinivil, norvac and coreg. Will not treat spikes with clonidine as drop in bp after clonidine may have helped precipitate extension. Slowly regulate bp with scheduled meds. 6. DM type 2: monitor with bid checks. Resume metformin as studies without contrast and discontinue lantus. Fair control 7. Hypokalemia: low normal on admit labs.  8. Dyslipidemia: continue Lipitor.  9. CAD: continue Lipitor, coreg, Imdur and Prinivil. Now on ASA and plavix due to recent stent/CVA 10. CVA: extension of previous stroke on MRI. Speech has improved but still has more weakness on R side  Continue asa and plavix per neuro  -avoid overtreatment of bp-check orthostatics      LOS (Days) 6 A FACE TO FACE EVALUATION WAS PERFORMED  KIRSTEINS,ANDREW E 05/07/2012, 8:55 AM

## 2012-05-07 NOTE — Progress Notes (Signed)
Physical Therapy Session Note  Patient Details  Name: [name] MRN: [id number] Date of Birth: 1946-01-11  Today's Date: 05/07/2012 Time: [id number] Time Calculation (min): 31 min  Short Term Goals: Week 1:  PT Short Term Goal 1 (Week 1): Patient will perform bed <> w/c transfers consistently to L and R with min A PT Short Term Goal 2 (Week 1): Patient will perform w/c mobility on unit x 150 with L hemi technique and min A PT Short Term Goal 3 (Week 1): Patient will perform gait training on unit x 81' with LRAD and appropriate orthosis on RLE with min A  PT Short Term Goal 4 (Week 1): Patient will perform up and down one step/curb with LRAD and appropriate orthosis with mod A  Skilled Therapeutic Interventions/Progress Updates:   Gait training with RW x 80' with min@, pt having difficulty with swing through due to foot drop and ?hip flexor weakness.  Sit to stand with min@.  Pt's BP checked at 170/97, returned to notify RN, who reported BP needs to be around 150/90 and that pt had just received his pain meds.   Therapy Documentation Precautions:  Precautions Precautions: Fall Precaution Comments: DM. CAD with stents Restrictions Weight Bearing Restrictions: No Pain:  No pain  See FIM for current functional status  Therapy/Group: Individual Therapy  Waylan Boga 05/07/2012, 12:14 PM

## 2012-05-07 NOTE — Patient Care Conference (Signed)
Inpatient RehabilitationTeam Conference Note Date: 05/07/2012   Time: 11:00 AM    Patient Name: [name]      Medical Record Number: [id number]  Date of Birth: 14-Sep-1945 Sex: Male         Room/Bed: 4036/4036-01 Payor Info: Payor: MEDICAID POTENTIAL  Plan: MEDICAID POTENTIAL  Product Type: *No Product type*     Admitting Diagnosis: L BG-CVA  Admit Date/Time:  05/01/2012  2:03 PM Admission Comments: No comment available   Primary Diagnosis:  Acute ischemic stroke Principal Problem: Acute ischemic stroke  Patient Active Problem List   Diagnosis Date Noted  . Acute ischemic stroke 04/28/2012  . Hypertensive emergency 04/28/2012  . Hyperglycemia 04/28/2012  . Dyslipidemia, (HDL 25) 03/10/2012  . NSTEMI - Occluded SVG-OM, s/p BMS 03/08/12 03/08/2012  . DM (diabetes mellitus),poorly controlled 03/07/2012  . 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 Alysia Penna Social Worker Present: Ovidio Kin, LCSW Nurse Present: Andria Meuse, RN PT Present: Raylene Everts, Toma Copier, PTA OT Present: Sim Boast, OT SLP Present: Gunnar Fusi, SLP Other (Discipline and Name): Jake Michaelis Coordinator     Current Status/Progress Goal Weekly Team Focus  Medical   no further hypotensive episodes however patient remains hypertensive at times. Stroke extension  gradually reduce blood pressure without causing hypotension  medication adjustments   Bowel/Bladder   continent bowel/bladder, urinal and bathroom toilet  remain continent of bowel and bladder  remain continent of bowel and bladder   Swallow/Nutrition/ Hydration             ADL's   mod assist bathing and UB dsg, min assist LB dsg, mod assist toilet and walk-in shower transfers  supervision bathing and dressing, mod I toileting and toilet transfer, supervision simple meal prep  RUE NM  re-ed, transfer training, dynamic standing balance   Mobility   supervision w/c mobility, mod A transfers, max A standing/gait, fluctuating BP  supervision-mod I overall  activity tolerance, standing balance, gait and strengthening of RLE   Communication   sueprvision-mod I   Mod I   patient educaiton and self monitoring   Safety/Cognition/ Behavioral Observations  supervision   Mod I   increase self monitoring and carryover   Pain   denied pain/ Tylenol 325-650 PRN, Nitrogycerin SL 0.4mg   remain free of pain  remain free of pain   Skin   no skin breakdown or infection  remain free of skin breakdown and infection  remain free of skin breakdown and infection      *See Interdisciplinary Assessment and Plan and progress notes for long and short-term goals  Barriers to Discharge: right hemiparesis worsened after extension    Possible Resolutions to Barriers:  increased rehabilitation length of stay    Discharge Planning/Teaching Needs:  Home with wife who can provide 24 hour care.  Wants to be as independent as possible at discharge      Team Discussion:  Extended CVA getting strength back-d/c from speech back to baseline.  Checking BP orthostatically adjusting meds  Revisions to Treatment Plan:  None   Continued Need for Acute Rehabilitation Level of Care: The patient requires daily medical management by a physician with specialized training in physical medicine and rehabilitation for the following conditions: Daily direction of a multidisciplinary physical rehabilitation program to ensure safe treatment while eliciting the highest outcome that is of practical value to the patient.:  Yes Daily medical management of patient stability for increased activity during participation in an intensive rehabilitation regime.: Yes Daily analysis of laboratory values and/or radiology reports with any subsequent need for medication adjustment of medical intervention for : Neurological  problems  [name], Gardiner Rhyme 05/07/2012, 4:15 PM

## 2012-05-07 NOTE — Progress Notes (Signed)
Physical Therapy Session Note  Patient Details  Name: [name] MRN: [id number] Date of Birth: 1946/07/26  Today's Date: 05/07/2012 Time: [id number] and [id number] Time Calculation (min): 20 min and 59 min  Short Term Goals: Week 1:  PT Short Term Goal 1 (Week 1): Patient will perform bed <> w/c transfers consistently to L and R with min A PT Short Term Goal 2 (Week 1): Patient will perform w/c mobility on unit x 150 with L hemi technique and min A PT Short Term Goal 3 (Week 1): Patient will perform gait training on unit x 91' with LRAD and appropriate orthosis on RLE with min A  PT Short Term Goal 4 (Week 1): Patient will perform up and down one step/curb with LRAD and appropriate orthosis with mod A  Therapy Documentation Precautions:  Precautions Precautions: Fall Precaution Comments: Monitor vitals: cease activity if BP >180/>110 and alert RN/MD.  DM, CAD with stents Restrictions Weight Bearing Restrictions: No Vital Signs: Therapy Vitals Temp: 98 F (36.7 C) Temp src: Oral Pulse Rate: 90  Resp: 19  BP: 145/92 mmHg Patient Position, if appropriate: Sitting Oxygen Therapy SpO2: 99 % O2 Device: None (Room air) Pain: Pain Assessment Pain Assessment: No/denies pain Locomotion : Curb negotiation training with RW with hand splint; demonstrated to patient safe curb negotiation sequence with RW ascending with LLE and descending with RLE; patient gave repeat demonstration with RW and mod A overall for safe sequence with RW up and down curb and verbal cues to recall safe stepping sequence when descending.  Discussed with patient and wife D/C date from conference and vital signs parameters for activity.  Will continue to monitor.  Other Treatments: Treatments Neuromuscular Facilitation: Right;Lower Extremity;Activity to increase motor control;Activity to increase timing and sequencing;Activity to increase sustained activation Modalities Modalities: Surveyor, quantity Location: R anterior tibialis and R hamstring muscle groups Electrical Stimulation Action: Bioness for ankle DF and knee flexion for gait training Electrical Stimulation Parameters: Please see BIONESS control unit Electrical Stimulation Goals: Strength;Neuromuscular facilitation Able to elicit ankle eversion but unable to elicit ankle DF contraction; will attempt again in a few days. See FIM for current functional status  Therapy/Group: Individual Therapy  Raylene Everts [facility] 05/07/2012, 3:54 PM

## 2012-05-08 ENCOUNTER — Inpatient Hospital Stay (HOSPITAL_COMMUNITY): Payer: Medicaid Other | Admitting: Occupational Therapy

## 2012-05-08 ENCOUNTER — Inpatient Hospital Stay (HOSPITAL_COMMUNITY): Payer: Medicaid Other | Admitting: Physical Therapy

## 2012-05-08 LAB — GLUCOSE, CAPILLARY

## 2012-05-08 MED ORDER — ISOSORBIDE MONONITRATE ER 30 MG PO TB24
30.0000 mg | ORAL_TABLET | Freq: Every day | ORAL | Status: DC
  Administered 2012-05-09 – 2012-05-16 (×8): 30 mg via ORAL
  Filled 2012-05-08 (×10): qty 1

## 2012-05-08 MED ORDER — AMLODIPINE BESYLATE 10 MG PO TABS
10.0000 mg | ORAL_TABLET | Freq: Every day | ORAL | Status: DC
  Administered 2012-05-09 – 2012-05-19 (×11): 10 mg via ORAL
  Filled 2012-05-08 (×14): qty 1

## 2012-05-08 MED ORDER — AMLODIPINE BESYLATE 10 MG PO TABS
10.0000 mg | ORAL_TABLET | Freq: Every day | ORAL | Status: DC
  Filled 2012-05-08: qty 1

## 2012-05-08 MED ORDER — CARVEDILOL 12.5 MG PO TABS
12.5000 mg | ORAL_TABLET | Freq: Two times a day (BID) | ORAL | Status: DC
  Administered 2012-05-09 – 2012-05-11 (×5): 12.5 mg via ORAL
  Filled 2012-05-08 (×7): qty 1

## 2012-05-08 MED ORDER — LISINOPRIL 20 MG PO TABS
20.0000 mg | ORAL_TABLET | Freq: Every day | ORAL | Status: DC
  Administered 2012-05-09 – 2012-05-21 (×13): 20 mg via ORAL
  Filled 2012-05-08 (×16): qty 1

## 2012-05-08 NOTE — Progress Notes (Signed)
Patient ID: [name], male   DOB: March 12, 1946, 66 y.o.   MRN: [id number] Subjective/Complaints: No further hypotensive episodes. Elevated BP this am No pain , no c/os A 12 point review of systems has been performed and if not noted above is otherwise negative.   Objective: Vital Signs: Blood pressure 185/137, pulse 99, temperature 98.3 F (36.8 C), temperature source Oral, resp. rate 22, height 5\' 8"  (1.727 m), weight 86.5 kg (190 lb 11.2 oz), SpO2 99.00%. No results found. No results found for this basename: WBC:2,HGB:2,HCT:2,PLT:2 in the last 72 hours No results found for this basename: NA:2,K:2,CL:2,CO2:2,GLUCOSE:2,BUN:2,CREATININE:2,CALCIUM:2 in the last 72 hours CBG (last 3)   Basename 05/08/12 0731 05/07/12 2039 05/07/12 1616  GLUCAP 138* 117* 116*    Wt Readings from Last 3 Encounters:  05/07/12 86.5 kg (190 lb 11.2 oz)  04/28/12 87.8 kg (193 lb 9 oz)  03/09/12 91.7 kg (202 lb 2.6 oz)    Physical Exam:  Nursing note and vitals reviewed.  Constitutional: He is oriented to person, place, and time. He appears well-developed and well-nourished.  HENT:  Head: Normocephalic and atraumatic.  Eyes: Pupils are equal, round, and reactive to light.  Neck: Normal range of motion. Neck supple.  Cardiovascular: Normal rate and regular rhythm. No murmurs or gallops  Pulmonary/Chest: Effort normal and breath sounds normal. No rales or wheezes, no distress  Abdominal: Soft. Bowel sounds are normal. Slightly distended Musculoskeletal: Normal range of motion. He exhibits no edema.  Neurological: He is alert and oriented to person, place, and time.  Able to identify simple objects. Follows all commnads. Speech is low volume but very intelligible. RUE is 1-2/5, RLE is 1-2/5 also. Sensation 1/2 on right. Right central 7 and tongue deviation  Skin: Skin is warm and dry.    Assessment/Plan: 1. 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 Physiatrist is providing close team supervision and 24 hour management of active medical problems listed below. Physiatrist and rehab team continue to assess barriers to discharge/monitor patient progress toward functional and medical goals. Team conference   FIM: FIM - Bathing Bathing Steps Patient Completed: Chest;Right Arm;Abdomen;Front perineal area;Buttocks;Right upper leg;Left upper leg;Right lower leg (including foot);Left lower leg (including foot) Bathing: 4: Min-Patient completes 8-9 2f 10 parts or 75+ percent  FIM - Upper Body Dressing/Undressing Upper body dressing/undressing steps patient completed: Thread/unthread right sleeve of pullover shirt/dresss;Put head through opening of pull over shirt/dress;Pull shirt over trunk Upper body dressing/undressing: 4: Min-Patient completed 75 plus % of tasks FIM - Lower Body Dressing/Undressing Lower body dressing/undressing steps patient completed: Thread/unthread right underwear leg;Thread/unthread left underwear leg;Pull underwear up/down;Thread/unthread right pants leg;Thread/unthread left pants leg;Pull pants up/down;Don/Doff right sock;Don/Doff left sock;Don/Doff left shoe Lower body dressing/undressing: 4: Min-Patient completed 75 plus % of tasks  FIM - Toileting Toileting steps completed by patient: Adjust clothing prior to toileting;Performs perineal hygiene;Adjust clothing after toileting Toileting Assistive Devices: Grab bar or rail for support Toileting: 4: Steadying assist  FIM - Air cabin crew Transfers: 2-To toilet/BSC: Max A (lift and lower assist);2-From toilet/BSC: Max A (lift and lower assist)  FIM - Control and instrumentation engineer Devices: Arm rests Bed/Chair Transfer: 3: Bed > Chair or W/C: Mod A (lift or lower assist);3: Chair or W/C > Bed: Mod A (lift or lower  assist)  FIM - Locomotion: Wheelchair Distance: 150 feet Locomotion: Wheelchair: 1: Total Assistance/staff  pushes wheelchair (Pt<25%) FIM - Locomotion: Ambulation Locomotion: Ambulation Assistive Devices: Walker - Rolling Ambulation/Gait Assistance: 3: Mod assist Locomotion: Ambulation: 2: Travels 50 - 149 ft with minimal assistance (Pt.>75%)  Comprehension Comprehension Mode: Auditory Comprehension: 6-Follows complex conversation/direction: With extra time/assistive device  Expression Expression Mode: Verbal Expression: 6-Expresses complex ideas: With extra time/assistive device  Social Interaction Social Interaction: 6-Interacts appropriately with others with medication or extra time (anti-anxiety, antidepressant).  Problem Solving Problem Solving: 6-Solves complex problems: With extra time  Memory Memory: 6-Assistive device: No helper  Medical Problem List and Plan:  1. DVT Prophylaxis/Anticoagulation: Pharmaceutical: Lovenox  2. Pain Management: N/A  3. Mood: seems appropriate. Ego support by team 4. Neuropsych: This patient is capable of making decisions on his/her own behalf.  5.HTN: monitor with bid checks. Continue Imdur, Prinivil, norvac and coreg. Will not treat spikes with clonidine as drop in bp after clonidine may have helped precipitate extension. Slowly regulate bp with scheduled meds. 6. DM type 2: monitor with bid checks. Resume metformin as studies without contrast and discontinue lantus. Fair control 7. Hypokalemia: low normal on admit labs.  8. Dyslipidemia: continue Lipitor.  9. CAD: continue Lipitor, coreg, Imdur and Prinivil. Now on ASA and plavix due to recent stent/CVA 10. CVA: extension of previous stroke on MRI. Speech has improved but still has more weakness on R side  Continue asa and plavix per neuro  -avoid overtreatment of bp- orthostatics ok will increase norvasc     LOS (Days) 7 A FACE TO FACE EVALUATION WAS PERFORMED  Kaleiah Kutzer  E 05/08/2012, 8:33 AM

## 2012-05-08 NOTE — Progress Notes (Signed)
Physical Therapy Weekly Progress Note  Patient Details  Name: [name] AGE MRN: [id number] Date of Birth: [birth year]  Today's Date: 05/08/2012 Time: 1100-1200 and [id number] Time Calculation (min): 60 min and 28 min  Patient is making slow but steady progress towards LTG and has met 2 of 4 short term goals.  Patient experienced an extension of his CVA infarct Friday pm following evaluations from PT, OT and Speech.  When re-assessed Monday patient noted to have no decline in functional mobility; patient did appear to have slightly decreased R ankle DF: 1/5 now.  Initiated [employer] electrical stimulation for RLE NMR but unable to elicit ankle DF contraction with multiple attempts and changes in stimulation settings.  Patient found to be at high falls risk as indicated by [name] balance score of 20/56.  Patient's ability to fully participate in therapy sessions has been limited by continued fluctuating and elevated BP.  Patient's D/C date set for 9/11 home with wife at [place] level.  Currently patient is supervsion-min A w/c mobility on unit, mod-max A overall for bed mobility, bed <> w/c transfers, standing and gait training with RW and hand orthosis.   Patient continues to demonstrate the following deficits: R hemiplegia with impaired motor control, timing, sequencing, coordination, impaired dynamic standing balance, gait, activity tolerance/endurance and therefore will continue to benefit from skilled PT intervention to enhance overall performance with activity tolerance, balance, ability to compensate for deficits, functional use of  right upper extremity and right lower extremity and coordination.  Patient progressing toward long term goals..  Continue plan of care.  PT Short Term Goals Week 1:  PT Short Term Goal 1 (Week 1): Patient will perform bed <> w/c transfers consistently to L and R with min A PT Short Term Goal 1 - Progress (Week 1): Progressing toward goal PT Short Term Goal  2 (Week 1): Patient will perform w/c mobility on unit x 150 with L hemi technique and min A PT Short Term Goal 2 - Progress (Week 1): Met PT Short Term Goal 3 (Week 1): Patient will perform gait training on unit x 63' with LRAD and appropriate orthosis on RLE with min A  PT Short Term Goal 3 - Progress (Week 1): Progressing toward goal PT Short Term Goal 4 (Week 1): Patient will perform up and down one step/curb with LRAD and appropriate orthosis with mod A PT Short Term Goal 4 - Progress (Week 1): Met Week 2:  PT Short Term Goal 1 (Week 2): = LTG  Skilled Therapeutic Interventions/Progress Updates:   PM session:  Continued gait training with bilat UE support on tall bedside table for increased upright posture, core and hip stabilization activation x 25' forwards, 25' R lateral stepping and retro stepping with focus on L lateral weight shift and full R step length and advancement and foot clearance and for hip ABD to minimize RLE ADD during advancement with verbal and tactile cues for L lateral weight shift 40% of the time which worsened as patient fatigued.  BP after gait: 116/83, HR 88 bpm.  Performed NMR for weight shift over RLE and activation of RLE extensors during stand <> squat x 5 reps with bilat UE sliding up and down R thigh for increased WB, extension input and weight shift over RLE with mod A  Therapy Documentation Precautions:  Precautions Precautions: Fall Precaution Comments: Monitor vitals: cease activity if BP >180/>110 and alert RN/MD.  DM, CAD with stents Restrictions Weight Bearing Restrictions: No Vital Signs: Therapy  Vitals Pulse Rate: 91  (88) BP: 116/79 mmHg (130/80 at end of session) Patient Position, if appropriate: Sitting Oxygen Therapy SpO2: 100 % O2 Device: None (Room air) Pain:  No c/o pain Locomotion : Ambulation Ambulation/Gait Assistance: 3: Mod assist with RW x 50' and assessing change in gait pattern with addition of PLS for DF assist; no evidence of  genu recurvatum but still presents with decreased L lateral weight shift and R trunk flexion with decreased hip and knee flexion in swing for foot clearance.  Gait after NMR x 25' with patient's bilat UE supported on therapist's shoulders for more upright trunk and second person assistance for facilitation of lateral weight shifting and tactile/verbal cues for RLE extensor activation in stance but patient did present with improved L lateral weight shift and R foot clearance but still with RLE ADD and decreased hip stability in stance  Wheelchair Mobility Distance: 150 with supervision in controlled environment with L hemi technique Other Treatments:  NMR for sustained activation of RLE extensors and L lateral weight shifting during reaching up and L out of BOS to play tic tac toe games on mirror and carry over of anterior/posterior and lateral weight shifting during RLE stepping forwards and L laterally with visual target and mod-max A for hip and knee flexion activation for foot clearance and placement  See FIM for current functional status  Therapy/Group: Individual Therapy  [name] [facility] 05/08/2012, 12:11 PM

## 2012-05-08 NOTE — Progress Notes (Addendum)
Pt Orthostatic BP was elevated. 170/126 lying, 177/122 sitting, 176/118 standing , 185/132 standing. CloNidine (Catapress) tablet 0.1 mg was given  At 605 am  due to DBP above 100.

## 2012-05-08 NOTE — Progress Notes (Signed)
Occupational Therapy Session Note  Patient Details  Name: [name] MRN: [id number] Date of Birth: 07-01-1946  Today's Date: 05/08/2012 Time: 0930-1030 and [id number] Time Calculation (min): 60 min and 43 min  Short Term Goals: Week 1:  OT Short Term Goal 1 (Week 1): Pt will complete UB dressing with supervision OT Short Term Goal 2 (Week 1): Pt will complete LB dressing with min assist OT Short Term Goal 3 (Week 1): Pt will complete toilet transfer with min assist using LRAD OT Short Term Goal 4 (Week 1): Pt will complete tub/shower transfer with min assist OT Short Term Goal 5 (Week 1): Pt will utilize RUE as diminished level in functional tasks with min cues  Skilled Therapeutic Interventions/Progress Updates:    1) Pt seen for ADL retraining at walk-in shower level in room shower.  Focus on stand pivot transfers, sit <> stand, dynamic sitting and standing balance, and use of RUE. Pt completed stand pivot transfer with use of grab bar and mod assist (lifting) with cues for weight shifting and body positioning.  Cues for weight bearing through RLE in standing with bathing and placement of RUE on grab bar for increased stability and engagement in activity.  Dressing completed with min assist with cues to dress Rt side first. Engaged in NM re-ed with focus on shoulder flexion and extension while grasping dowel rod - simulating gear shifts.  Tactile cues at shoulder to promote appropriate movement pattern. Pt reports some stiffness with RUE movements, plan to further address during next session.  2) 1:1 OT with focus on RUE PROM and AAROM in sidelying, supine, and sitting.  Pt with tightness in Rt shoulder with abduction and flexion/extension.  Performed shoulder mobes in sidelying with focus on internal/external rotation and elevation/depression with some noted tightness at end points.  Place and hold shoulder flexion in supine with focus on sustained stretch.  A/AROM in sitting and  sidelying with shoulder flexion against minimal resistance.  Pt with reports of decreased tightness as session progressed.  Educated pt on tone and fluctuations during recovery of stroke and encouraged to discuss with MD if persists.  Therapy Documentation Precautions:  Precautions Precautions: Fall Precaution Comments: Monitor vitals: cease activity if BP >180/>110 and alert RN/MD.  DM, CAD with stents Restrictions Weight Bearing Restrictions: No General:   Vital Signs: Therapy Vitals BP: 123/85 mmHg (138/94 (standing)) Patient Position, if appropriate: Sitting Pain:  Pt with no c/o pain this session.  See FIM for current functional status  Therapy/Group: Individual Therapy  Sim Boast 05/08/2012, 10:41 AM

## 2012-05-09 ENCOUNTER — Inpatient Hospital Stay (HOSPITAL_COMMUNITY): Payer: Medicaid Other

## 2012-05-09 ENCOUNTER — Inpatient Hospital Stay (HOSPITAL_COMMUNITY): Payer: Medicaid Other | Admitting: Occupational Therapy

## 2012-05-09 ENCOUNTER — Inpatient Hospital Stay (HOSPITAL_COMMUNITY): Payer: Medicaid Other | Admitting: Physical Therapy

## 2012-05-09 LAB — GLUCOSE, CAPILLARY
Glucose-Capillary: 147 mg/dL — ABNORMAL HIGH (ref 70–99)
Glucose-Capillary: 118 mg/dL — ABNORMAL HIGH (ref 70–99)
Glucose-Capillary: 119 mg/dL — ABNORMAL HIGH (ref 70–99)
Glucose-Capillary: 127 mg/dL — ABNORMAL HIGH (ref 70–99)

## 2012-05-09 NOTE — Progress Notes (Signed)
Physical Therapy Session Note  Patient Details  Name: [name] MRN: [id number] Date of Birth: 10/10/1945  Today's Date: 05/09/2012 Time: [id number] Time Calculation (min): 38 min  Short Term Goals: Week 2:  PT Short Term Goal 1 (Week 2): = LTG  Skilled Therapeutic Interventions/Progress Updates: Session #2 see details below.  Worked primarily on weight shifting and right lower extremity coordination activities.     Therapy Documentation Precautions:  Precautions Precautions: Fall Precaution Comments: Monitor vitals: cease activity if BP >180/>110 and alert RN/MD.  DM, CAD with stents Required Braces or Orthoses: Other Brace/Splint (R foot AFO-applied in shoe with heel lift ) Restrictions Weight Bearing Restrictions: No   Pain: Pain Assessment Pain Assessment: No/denies pain Pain Score: 0-No pain    Other Treatments: Treatments Therapeutic Activity: Pt performed WC mobility 250' x 2 in controlled level environment modified independent for actually moving the WC down the hall and around obstcles, even stopping once to move something out of the floor out of the way reaching with his left hand to his left side (~4 inches from the floor).  He did need some assist ~10% to get the right leg rest off and on.  Performed WC <-> mat transfer  with RW mod assist towards his right, min assist towards his left with verbal cues to push up from his sitting surface.  His new AFO was donned total assit and we performed weigh shifting exercies in standing working on both wt shifting right and left with forward and reverse stepping exercises, squatting without holding on with his left hand and cone taps bil multiple sets working on coordination and WB in right leg.  Pt. tolerated tx session well no reports of dizziness, just fatigue after working hard.   Neuromuscular Facilitation: Right;Upper Extremity;Left;Lower Extremity;Activity to increase coordination;Activity to increase motor  control;Activity to increase timing and sequencing;Activity to increase sustained activation;Activity to increase lateral weight shifting;Activity to increase anterior-posterior weight shifting  See FIM for current functional status  Therapy/Group: Individual Therapy  Wells Guiles B. [place], [place], DPT [phone]   05/09/2012, 3:49 PM

## 2012-05-09 NOTE — Progress Notes (Signed)
Occupational Therapy Weekly Progress Note and Treatment Session.  Patient Details  Name: [name] MRN: [id number] Date of Birth: 07-20-1946  Today's Date: 05/09/2012 Time: [id number] Time Calculation (min): 45 min  Patient has met 3 of 5 short term goals.  Pt is making steady progress towards goals.  Pt's CVA extended Friday 8/23 PM following evaluations and upon reassessing, pt had little decline in functional mobility and ability to carry out self-care tasks.  However, strength and AROM in RUE is decreased from eval and tone is beginning to set in which is impacting his ability to integrate RUE into functional tasks.  Pt's elevated and fluctuating BPs negatively impact his ability to participate fully in therapy.  Pt is progressing nicely with self-care tasks requiring min assist overall with goals at supervision/mod I.  Patient continues to demonstrate the following deficits: Rt hemiplegia, impaired motor control, sequencing, coordination, impaired dynamic standing balance and therefore will continue to benefit from skilled OT intervention to enhance overall performance with BADL and Reduce care partner burden.  Patient progressing toward long term goals..  Continue plan of care.  OT Short Term Goals Week 1:  OT Short Term Goal 1 (Week 1): Pt will complete UB dressing with supervision OT Short Term Goal 1 - Progress (Week 1): Met OT Short Term Goal 2 (Week 1): Pt will complete LB dressing with min assist OT Short Term Goal 2 - Progress (Week 1): Met OT Short Term Goal 3 (Week 1): Pt will complete toilet transfer with min assist using LRAD OT Short Term Goal 3 - Progress (Week 1): Met OT Short Term Goal 4 (Week 1): Pt will complete tub/shower transfer with min assist OT Short Term Goal 4 - Progress (Week 1): Progressing toward goal OT Short Term Goal 5 (Week 1): Pt will utilize RUE as diminished level in functional tasks with min cues OT Short Term Goal 5 - Progress (Week 1):  Progressing toward goal Week 2:  OT Short Term Goal 1 (Week 2): Pt will complete LB dressing with supervision. OT Short Term Goal 2 (Week 2): Pt will complete toilet transfer with supervision with LRAD. OT Short Term Goal 3 (Week 2): Pt will complete 3 grooming tasks in standing with supervision. OT Short Term Goal 4 (Week 2): Pt will complete tub/shower transfer with min assist OT Short Term Goal 5 (Week 2): Pt will utilize RUE as diminished level in functional tasks with min cues   Skilled Therapeutic Interventions/Progress Updates:    1:1 OT with focus on NM re-ed in sitting and supine with focus on increased trunk control with LB bridging and side elongation.  Use of RUE with rolling ball on table with focus on increased shoulder flexion/extension and horizontal abduction/adduction.  Pt with increased tone which benefited from increased ROM and gentle stretching.  Therapy Documentation Precautions:  Precautions Precautions: Fall Precaution Comments: Monitor vitals: cease activity if BP >180/>110 and alert RN/MD.  DM, CAD with stents Restrictions Weight Bearing Restrictions: No Pain: Pain Assessment Pain Assessment: No/denies pain ADL: ADL Equipment Provided:  (shoe button) Grooming: Setup Where Assessed-Grooming: Sitting at sink Upper Body Bathing: Supervision/safety Where Assessed-Upper Body Bathing: Shower Lower Body Bathing: Supervision/safety Where Assessed-Lower Body Bathing: Shower Upper Body Dressing: Setup Where Assessed-Upper Body Dressing: Sitting at sink Lower Body Dressing: Minimal assistance Where Assessed-Lower Body Dressing: Sitting at sink;Standing at sink Toileting: Minimal assistance Where Assessed-Toileting: Glass blower/designer: Psychiatric nurse Method: Ambulating (with RW) Science writer: Energy manager:  Minimal assistance Social research officer, government Method: Ambulating (with RW) Youth worker:  Shower seat with back;Grab bars ADL Comments: Pt's stroke extended since initial eval, with most noted change of decreased RUE ROM and strength.   See FIM for current functional status  Therapy/Group: Individual Therapy  Sim Boast 05/09/2012, 2:59 PM

## 2012-05-09 NOTE — Progress Notes (Signed)
Patient ID: [name], male   DOB: 1946-01-02, 66 y.o.   MRN: [id number] Subjective/Complaints: No further hypotensive episodes. Elevated BP this am No pain , no c/os A 12 point review of systems has been performed and if not noted above is otherwise negative.   Objective: Vital Signs: Blood pressure 161/95, pulse 97, temperature 98.1 F (36.7 C), temperature source Oral, resp. rate 22, height 5\' 8"  (1.727 m), weight 86.5 kg (190 lb 11.2 oz), SpO2 100.00%. No results found. No results found for this basename: WBC:2,HGB:2,HCT:2,PLT:2 in the last 72 hours No results found for this basename: NA:2,K:2,CL:2,CO2:2,GLUCOSE:2,BUN:2,CREATININE:2,CALCIUM:2 in the last 72 hours CBG (last 3)   Basename 05/09/12 0731 05/08/12 2104 05/08/12 1640  GLUCAP 147* 96 106*    Wt Readings from Last 3 Encounters:  05/07/12 86.5 kg (190 lb 11.2 oz)  04/28/12 87.8 kg (193 lb 9 oz)  03/09/12 91.7 kg (202 lb 2.6 oz)    Physical Exam:  Nursing note and vitals reviewed.  Constitutional: He is oriented to person, place, and time. He appears well-developed and well-nourished.  HENT:  Head: Normocephalic and atraumatic.  Eyes: Pupils are equal, round, and reactive to light.  Neck: Normal range of motion. Neck supple.  Cardiovascular: Normal rate and regular rhythm. No murmurs or gallops  Pulmonary/Chest: Effort normal and breath sounds normal. No rales or wheezes, no distress  Abdominal: Soft. Bowel sounds are normal. Slightly distended Musculoskeletal: Normal range of motion. He exhibits no edema.  Neurological: He is alert and oriented to person, place, and time.  Able to identify simple objects. Follows all commnads. Speech is low volume but very intelligible. RUE is 1-2/5, RLE is 1-2/5 also. Sensation 1/2 on right. Right central 7 and tongue deviation  Skin: Skin is warm and dry.    Assessment/Plan: 1. 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 Physiatrist is providing close team supervision and 24 hour management of active medical problems listed below. Physiatrist and rehab team continue to assess barriers to discharge/monitor patient progress toward functional and medical goals. Team conference   FIM: FIM - Bathing Bathing Steps Patient Completed: Chest;Right Arm;Abdomen;Front perineal area;Buttocks;Right upper leg;Left upper leg;Right lower leg (including foot);Left lower leg (including foot) Bathing: 4: Min-Patient completes 8-9 50f 10 parts or 75+ percent  FIM - Upper Body Dressing/Undressing Upper body dressing/undressing steps patient completed: Thread/unthread left sleeve of pullover shirt/dress;Put head through opening of pull over shirt/dress;Thread/unthread right sleeve of pullover shirt/dresss Upper body dressing/undressing: 4: Min-Patient completed 75 plus % of tasks FIM - Lower Body Dressing/Undressing Lower body dressing/undressing steps patient completed: Thread/unthread right underwear leg;Thread/unthread left underwear leg;Pull underwear up/down;Thread/unthread right pants leg;Thread/unthread left pants leg;Pull pants up/down;Don/Doff left sock;Don/Doff right sock;Don/Doff left shoe Lower body dressing/undressing: 4: Min-Patient completed 75 plus % of tasks  FIM - Toileting Toileting steps completed by patient: Adjust clothing prior to toileting;Performs perineal hygiene;Adjust clothing after toileting Toileting Assistive Devices: Grab bar or rail for support Toileting: 4: Steadying assist  FIM - Air cabin crew Transfers: 2-To toilet/BSC: Max A (lift and lower assist);2-From toilet/BSC: Max A (lift and lower assist)  FIM - Control and instrumentation engineer Devices: Copy: 3: Bed > Chair or W/C: Mod A (lift or lower assist);3: Chair or W/C > Bed: Mod  A (lift or lower assist)  FIM - Locomotion: Wheelchair Distance: 150 Locomotion: Wheelchair: 5: Travels 150  ft or more: maneuvers on rugs and over door sills with supervision, cueing or coaxing FIM - Locomotion: Ambulation Locomotion: Ambulation Assistive Devices: Orthosis;Walker - Rolling Ambulation/Gait Assistance: 3: Mod assist Locomotion: Ambulation: 2: Travels 50 - 149 ft with moderate assistance (Pt: 50 - 74%)  Comprehension Comprehension Mode: Auditory Comprehension: 6-Follows complex conversation/direction: With extra time/assistive device  Expression Expression Mode: Verbal Expression: 6-Expresses complex ideas: With extra time/assistive device  Social Interaction Social Interaction: 6-Interacts appropriately with others with medication or extra time (anti-anxiety, antidepressant).  Problem Solving Problem Solving: 6-Solves complex problems: With extra time  Memory Memory: 6-Assistive device: No helper  Medical Problem List and Plan:  1. DVT Prophylaxis/Anticoagulation: Pharmaceutical: Lovenox  2. Pain Management: N/A  3. Mood: seems appropriate. Ego support by team 4. Neuropsych: This patient is capable of making decisions on his/her own behalf.  5.HTN: monitor with bid checks. Continue Imdur, Prinivil, norvac and coreg. Will not treat spikes with clonidine as drop in bp after clonidine may have helped precipitate extension. Slowly regulate bp with scheduled meds.Norvasc Dose increased 0n 8/30.  Would monitor for 2-3 days prior to increasing other meds.  May treat spikes with clonidine 6. DM type 2: monitor with bid checks. Resume metformin as studies without contrast and discontinue lantus. Fair control 7. Hypokalemia: low normal on admit labs.  8. Dyslipidemia: continue Lipitor.  9. CAD: continue Lipitor, coreg, Imdur and Prinivil. Now on ASA and plavix due to recent stent/CVA 10. CVA: extension of previous stroke on MRI. Speech has improved but still has more  weakness on R side  Continue asa and plavix per neuro  -avoid overtreatment of bp- orthostatics ok will increase norvasc     LOS (Days) 8 A FACE TO FACE EVALUATION WAS PERFORMED  KIRSTEINS,ANDREW E 05/09/2012, 8:32 AM

## 2012-05-09 NOTE — Progress Notes (Signed)
Orthopedic Tech Progress Note Patient Details:  [name] [birth year] [id number]  Patient ID: [name], male   DOB: [birth year], 66 y.o.   MRN: [id number]   Irish Elders 05/09/2012, 9:38 AM CALLED ADVANCED FOR RIGHT AFO

## 2012-05-09 NOTE — Progress Notes (Signed)
Physical Therapy Session Note  Patient Details  Name: [name] MRN: [id number] Date of Birth: 1945-12-18  Today's Date: 05/09/2012 Time: 1100-1205 Time Calculation (min): 65 min  Short Term Goals: Week 2:  PT Short Term Goal 1 (Week 2): = LTG  Therapy Documentation Precautions:  Precautions Precautions: Fall Precaution Comments: Monitor vitals: cease activity if BP >180/>110 and alert RN/MD.  DM, CAD with stents Restrictions Weight Bearing Restrictions: No Vital Signs:  BP 138/90 prior to therapy Pain: Pain Assessment Pain Assessment: No/denies pain Locomotion : Ambulation Ambulation/Gait Assistance: 3: Mod assist x 25' and RW with hand orthosis with P&O observing gait mechanics to assess for appropriate ankle DF assist orthosis.  Secondary to improved knee control assessed patient again with addition of black ALLARD DF assist brace on RLE with 1/4 heel wedge; patient demonstrated improved toe clearance and forward translation over RLE but still presents with decreased overall RLE advancement and foot clearance secondary to poor L lateral weight shift, hip stability and trunk elongation during swing and stance phase of gait.  Patient to receive black Allard on Monday for home and community mobility.  Continued NMR to address weight shifting and trunk/postural control. Other Treatments: Treatments Neuromuscular Facilitation: Right;Upper Extremity;Lower Extremity;Activity to increase motor control;Activity to increase timing and sequencing;Activity to increase sustained activation;Activity to increase lateral weight shifting;Activity to increase anterior-posterior weight shifting on [employer] with and without UE support during lateral weight shifting with improved upright posture; changed to standing with RUE reaching to R and upwards towards wall for R stance activation of hip and knee extensors, stretching of R chest musculature and active UE elbow extension to achieve greater trunk  elongation with +2 A for weight shift and to support arm in reaching.  Changed to reaching up and to the R with hand on wall during gait x 10' with verbal and tactile cues to push UE into wall to facilitate trunk elongation, weight shift and activation of extensors in more dynamic movement; patient reporting pain in fingers along wall, ceased gait.    See FIM for current functional status  Therapy/Group: Individual Therapy  Raylene Everts [place] 05/09/2012, 12:21 PM

## 2012-05-09 NOTE — Progress Notes (Signed)
Occupational Therapy Session Note  Patient Details  Name: [name] MRN: [id number] Date of Birth: 1946/04/29  Today's Date: 05/09/2012 Time: 0900-1000 Time Calculation (min): 60 min  OT Short Term Goals Week 1:  OT Short Term Goal 1 (Week 1): Pt will complete UB dressing with supervision OT Short Term Goal 2 (Week 1): Pt will complete LB dressing with min assist OT Short Term Goal 3 (Week 1): Pt will complete toilet transfer with min assist using LRAD OT Short Term Goal 4 (Week 1): Pt will complete tub/shower transfer with min assist OT Short Term Goal 5 (Week 1): Pt will utilize RUE as diminished level in functional tasks with min cues  Skilled Therapeutic Interventions/Progress Updates:    Pt in w/c and requested to use toilet; stand pivot toilet transfer with min A.  Pt elected to bathe at shower level requiring assistance with bathing LUE.  Pt stood in shower using grab bars and widening stance for balance to bathe buttocks and front perineal area.  Pt completed dressing tasks with sit<>stand from w/c to pull up pants. Pt required assistance with donning right sock and shoe.  Shoe button introduced and applied to shoes; pt return demonstrated use and stated he thinks they will be useful for tieing shoes.  Pt transitioned to gym for neuroreed including weight bearing through RUE and facilitated movement including shoulder flexion/extension and elbow flexion/extension.  Sitting on edge of mat and right hand placed on stool balanced on two legs patient able to balance stool and push stool away with controlled shoulder flexion/extension.  Therapy Documentation Precautions:  Precautions Precautions: Fall Precaution Comments: Monitor vitals: cease activity if BP >180/>110 and alert RN/MD.  DM, CAD with stents Restrictions Weight Bearing Restrictions: No   Pain: Pain Assessment Pain Assessment: No/denies pain Exercises:   See FIM for current functional status  Therapy/Group:  Individual Therapy  Leroy Libman 05/09/2012, 11:02 AM

## 2012-05-09 NOTE — Progress Notes (Signed)
Nutrition Brief Note  Patient identified on the Malnutrition Screening Tool (MST) report for "Unsure of weight loss", generating a score of 2. Per Epic weight histories, pt with 6% wt loss x 2 months. This weight loss is not significant. Pt is unable to explain weight loss. Family reports that patient has adequate intake PTA and is currently eating very well.  Body mass index is 29.00 kg/(m^2). Pt meets criteria for Overweight based on current BMI.   Current diet order is Carbohydrate Modified Medium, patient is consuming approximately 100% of meals at this time. Labs and medications reviewed.   No nutrition interventions warranted at this time. If nutrition issues arise, please consult RD.   Inda Coke MS, RD, LDN Pager: [phone] After-hours pager: [phone]

## 2012-05-10 ENCOUNTER — Inpatient Hospital Stay (HOSPITAL_COMMUNITY): Payer: Medicaid Other | Admitting: Physical Therapy

## 2012-05-10 LAB — GLUCOSE, CAPILLARY: Glucose-Capillary: 109 mg/dL — ABNORMAL HIGH (ref 70–99)

## 2012-05-10 NOTE — Progress Notes (Signed)
Patient ID: [name], male   DOB: 12-03-1945, 66 y.o.   MRN: [id number] Patient ID: [name], male   DOB: 04/21/1946, 66 y.o.   MRN: [id number] Subjective/Complaints: No further hypotensive episodes. Elevated/labile BP this am No pain , no c/os A 12 point review of systems has been performed and if not noted above is otherwise negative. No new complaints   Objective: Vital Signs: Blood pressure 120/85, pulse 88, temperature 98.6 F (37 C), temperature source Oral, resp. rate 18, height 5\' 8"  (1.727 m), weight 190 lb 11.2 oz (86.5 kg), SpO2 96.00%. No results found. No results found for this basename: WBC:2,HGB:2,HCT:2,PLT:2 in the last 72 hours No results found for this basename: NA:2,K:2,CL:2,CO2:2,GLUCOSE:2,BUN:2,CREATININE:2,CALCIUM:2 in the last 72 hours CBG (last 3)   Basename 05/10/12 0729 05/09/12 2055 05/09/12 1623  GLUCAP 161* 127* 119*    Wt Readings from Last 3 Encounters:  05/07/12 190 lb 11.2 oz (86.5 kg)  04/28/12 193 lb 9 oz (87.8 kg)  03/09/12 202 lb 2.6 oz (91.7 kg)    Physical Exam:  Nursing note and vitals reviewed.  Constitutional: He is oriented to person, place, and time. He appears well-developed and well-nourished.  HENT:  Head: Normocephalic and atraumatic.  Eyes: Pupils are equal, round, and reactive to light.  Neck: Normal range of motion. Neck supple.  Cardiovascular: Normal rate and regular rhythm. No murmurs or gallops  Pulmonary/Chest: Effort normal and breath sounds normal. No rales or wheezes, no distress  Abdominal: Soft. Bowel sounds are normal. Slightly distended Musculoskeletal: Normal range of motion. He exhibits no edema.  Neurological: He is alert and oriented to person, place, and time.  Able to identify simple objects. Follows all commnads. Speech is low volume but very intelligible. RUE is 1-2/5, RLE is 1-2/5 also. Sensation 1/2 on right. Right central 7 and tongue deviation  Skin: Skin is warm and dry.     Assessment/Plan: 1. 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 Physiatrist is providing close team supervision and 24 hour management of active medical problems listed below. Physiatrist and rehab team continue to assess barriers to discharge/monitor patient progress toward functional and medical goals. Team conference   FIM: FIM - Bathing Bathing Steps Patient Completed: Chest;Right Arm;Abdomen;Front perineal area;Buttocks;Right upper leg;Left upper leg;Right lower leg (including foot);Left lower leg (including foot) Bathing: 4: Min-Patient completes 8-9 33f 10 parts or 75+ percent  FIM - Upper Body Dressing/Undressing Upper body dressing/undressing steps patient completed: Thread/unthread left sleeve of pullover shirt/dress;Put head through opening of pull over shirt/dress;Thread/unthread right sleeve of pullover shirt/dresss;Pull shirt over trunk Upper body dressing/undressing: 5: Supervision: Safety issues/verbal cues FIM - Lower Body Dressing/Undressing Lower body dressing/undressing steps patient completed: Thread/unthread right underwear leg;Thread/unthread left underwear leg;Pull underwear up/down;Thread/unthread right pants leg;Thread/unthread left pants leg;Pull pants up/down;Don/Doff left sock;Don/Doff left shoe Lower body dressing/undressing: 4: Min-Patient completed 75 plus % of tasks  FIM - Toileting Toileting steps completed by patient: Adjust clothing prior to toileting;Performs perineal hygiene;Adjust clothing after toileting Toileting Assistive Devices: Grab bar or rail for support Toileting: 4: Steadying assist  FIM - Radio producer Devices: Elevated toilet seat Toilet Transfers: 4-To toilet/BSC: Min A (steadying Pt. > 75%);4-From toilet/BSC: Min A (steadying Pt. >  75%)  FIM - Bed/Chair Transfer Bed/Chair Transfer Assistive Devices: Adult nurse Transfer: 3: Chair or W/C > Bed: Mod A (lift or lower  assist);3: Bed > Chair or W/C: Mod A (lift or lower assist)  FIM - Locomotion: Wheelchair Distance: 150 Locomotion: Wheelchair: 3: Travels 150 ft or more: maneuvers on rugs and over door sills with moderate assistance  (Pt: 50 - 74%) (only because he needed assist of one of the leg rests) FIM - Locomotion: Ambulation Locomotion: Ambulation Assistive Devices: Walker - Rolling;Orthosis Ambulation/Gait Assistance: 3: Mod assist Locomotion: Ambulation: 1: Travels less than 50 ft with minimal assistance (Pt.>75%)  Comprehension Comprehension Mode: Auditory Comprehension: 6-Follows complex conversation/direction: With extra time/assistive device  Expression Expression Mode: Verbal Expression: 6-Expresses complex ideas: With extra time/assistive device  Social Interaction Social Interaction: 6-Interacts appropriately with others with medication or extra time (anti-anxiety, antidepressant).  Problem Solving Problem Solving: 6-Solves complex problems: With extra time  Memory Memory: 6-Assistive device: No helper  Medical Problem List and Plan:  1. DVT Prophylaxis/Anticoagulation: Pharmaceutical: Lovenox  2. Pain Management: N/A  3. Mood: seems appropriate. Ego support by team 4. Neuropsych: This patient is capable of making decisions on his/her own behalf.  5.HTN: monitor with bid checks. Continue Imdur, Prinivil, norvac and coreg. Will not treat spikes with clonidine as drop in bp after clonidine may have helped precipitate extension. Slowly regulate bp with scheduled meds.Norvasc Dose increased 0n 8/30.  Would monitor for 2-3 days prior to increasing other meds.  May need to increase Coreg to 25 mg bid 6. DM type 2: monitor with bid checks. Resume metformin as studies without contrast and discontinue lantus. Fair control 7. Hypokalemia: low normal  on admit labs.  8. Dyslipidemia: continue Lipitor.  9. CAD: continue Lipitor, coreg, Imdur and Prinivil. Now on ASA and plavix due to recent stent/CVA 10. CVA: extension of previous stroke on MRI. Speech has improved but still has more weakness on R side  Continue asa and plavix per neuro  -avoid overtreatment of bp- orthostatics ok will increase norvasc     LOS (Days) 9 A FACE TO FACE EVALUATION WAS PERFORMED  Alex Ajia Chadderdon 05/10/2012, 9:04 AM

## 2012-05-10 NOTE — Progress Notes (Signed)
Physical Therapy Note  Patient Details  Name: [name] MRN: [id number] Date of Birth: 02-08-1946 Today's Date: 05/10/2012  13:00-14:00 group therapy pt denied pain.  Pt participated in walking group focusing on dynamic balance to hit baseball with bat, walking forward, backwards and stepping over object with rw and hand splint with min assist and vc for walker placement with backing and turning and technique to step backwards without compensation at trunk to advance rt leg.   Erle Crocker 05/10/2012, 4:31 PM

## 2012-05-11 ENCOUNTER — Inpatient Hospital Stay (HOSPITAL_COMMUNITY): Payer: Medicaid Other | Admitting: Physical Therapy

## 2012-05-11 LAB — GLUCOSE, CAPILLARY
Glucose-Capillary: 149 mg/dL — ABNORMAL HIGH (ref 70–99)
Glucose-Capillary: 119 mg/dL — ABNORMAL HIGH (ref 70–99)
Glucose-Capillary: 114 mg/dL — ABNORMAL HIGH (ref 70–99)
Glucose-Capillary: 98 mg/dL (ref 70–99)

## 2012-05-11 MED ORDER — CARVEDILOL 25 MG PO TABS
25.0000 mg | ORAL_TABLET | Freq: Two times a day (BID) | ORAL | Status: DC
  Administered 2012-05-11 – 2012-05-18 (×14): 25 mg via ORAL
  Filled 2012-05-11 (×17): qty 1

## 2012-05-11 NOTE — Progress Notes (Signed)
Subjective/Complaints:  No further hypotensive episodes. Elevated/labile BP this am No pain , no c/os A 12 point review of systems has been performed and if not noted above is otherwise negative. No new complaints   Objective: Vital Signs: Blood pressure 178/124, pulse 105, temperature 98.6 F (37 C), temperature source Oral, resp. rate 18, height 5\' 8"  (1.727 m), weight 190 lb 11.2 oz (86.5 kg), SpO2 98.00%. No results found. No results found for this basename: WBC:2,HGB:2,HCT:2,PLT:2 in the last 72 hours No results found for this basename: NA:2,K:2,CL:2,CO2:2,GLUCOSE:2,BUN:2,CREATININE:2,CALCIUM:2 in the last 72 hours CBG (last 3)   Basename 05/11/12 0733 05/10/12 2121 05/10/12 1608  GLUCAP 149* 131* 147*    Wt Readings from Last 3 Encounters:  05/07/12 190 lb 11.2 oz (86.5 kg)  04/28/12 193 lb 9 oz (87.8 kg)  03/09/12 202 lb 2.6 oz (91.7 kg)    Physical Exam:  Nursing note and vitals reviewed.  Constitutional: He is oriented to person, place, and time. He appears well-developed and well-nourished.  HENT:  Head: Normocephalic and atraumatic.  Eyes: Pupils are equal, round, and reactive to light.  Neck: Normal range of motion. Neck supple.  Cardiovascular: Normal rate and regular rhythm. No murmurs or gallops  Pulmonary/Chest: Effort normal and breath sounds normal. No rales or wheezes, no distress  Abdominal: Soft. Bowel sounds are normal. Slightly distended Musculoskeletal: Normal range of motion. He exhibits no edema.  Neurological: He is alert and oriented to person, place, and time.  Able to identify simple objects. Follows all commnads. Speech is low volume but very intelligible. RUE is 1-2/5, RLE is 1-2/5 also. Sensation 1/2 on right. Right central 7 and tongue deviation  Skin: Skin is warm and dry.    Assessment/Plan: 1. 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 Physiatrist is providing close team supervision and 24 hour management of active medical problems listed below. Physiatrist and rehab team continue to assess barriers to discharge/monitor patient progress toward functional and medical goals. Team conference   FIM: FIM - Bathing Bathing Steps Patient Completed: Chest;Right Arm;Left Arm;Abdomen;Front perineal area;Buttocks;Right upper leg;Left upper leg;Right lower leg (including foot) Bathing: 6: More than reasonable amount of time  FIM - Upper Body Dressing/Undressing Upper body dressing/undressing steps patient completed: Thread/unthread left sleeve of pullover shirt/dress;Put head through opening of pull over shirt/dress Upper body dressing/undressing: 4: Steadying assist FIM - Lower Body Dressing/Undressing Lower body dressing/undressing steps patient completed: Pull underwear up/down;Thread/unthread right pants leg;Thread/unthread left pants leg;Pull pants up/down Lower body dressing/undressing: 5: Supervision: Safety issues/verbal cues  FIM - Toileting Toileting steps completed by patient: Adjust clothing prior to toileting;Performs perineal hygiene;Adjust clothing after toileting Toileting Assistive Devices: Grab bar or rail for support Toileting: 4: Steadying assist  FIM - Radio producer Devices: Elevated toilet seat Toilet Transfers: 4-To toilet/BSC: Min A (steadying Pt. > 75%);4-From toilet/BSC: Min A (steadying Pt. > 75%)  FIM - Bed/Chair Transfer Bed/Chair Transfer Assistive Devices: Copy: 3: Bed > Chair or W/C: Mod A (lift or lower assist);3: Chair or W/C > Bed: Mod A (lift or lower assist)  FIM - Locomotion: Wheelchair Distance: 150 Locomotion: Wheelchair: 3: Travels 150 ft or more: maneuvers on rugs and over door sills with moderate assistance  (Pt: 50 - 74%) (only because he needed assist of one of  the leg rests) FIM - Locomotion: Ambulation Locomotion: Ambulation Assistive  Devices: Walker - Rolling;Orthosis Ambulation/Gait Assistance: 3: Mod assist Locomotion: Ambulation: 1: Travels less than 50 ft with minimal assistance (Pt.>75%)  Comprehension Comprehension Mode: Auditory Comprehension: 6-Follows complex conversation/direction: With extra time/assistive device  Expression Expression Mode: Verbal Expression: 6-Expresses complex ideas: With extra time/assistive device  Social Interaction Social Interaction: 6-Interacts appropriately with others with medication or extra time (anti-anxiety, antidepressant).  Problem Solving Problem Solving: 6-Solves complex problems: With extra time  Memory Memory: 6-Assistive device: No helper  Medical Problem List and Plan:  1. DVT Prophylaxis/Anticoagulation: Pharmaceutical: Lovenox  2. Pain Management: N/A  3. Mood: seems appropriate. Ego support by team 4. Neuropsych: This patient is capable of making decisions on his/her own behalf.  5.HTN: monitor with bid checks. Continue Imdur, Prinivil, norvac and coreg. Will not treat spikes with clonidine as drop in bp after clonidine may have helped precipitate extension. Slowly regulate bp with scheduled meds.Norvasc Dose increased 0n 8/30.  Would monitor for 2-3 days prior to increasing other meds.  We increased Coreg to 25 mg bid 6. DM type 2: monitor with bid checks. Resume metformin as studies without contrast and discontinue lantus. Fair control 7. Hypokalemia: low normal on admit labs.  8. Dyslipidemia: continue Lipitor.  9. CAD: continue Lipitor, coreg, Imdur and Prinivil. Now on ASA and plavix due to recent stent/CVA 10. CVA: extension of previous stroke on MRI. Speech has improved but still has more weakness on R side  Continue asa and plavix per neuro  -avoid overtreatment of bp- orthostatics ok will increase norvasc     LOS (Days) 10 A FACE TO FACE EVALUATION WAS PERFORMED  Alex  Plotnikov 05/11/2012, 9:00 AM

## 2012-05-11 NOTE — Progress Notes (Signed)
Occupational Therapy Note  Patient Details  Name: [name] MRN: [id number] Date of Birth: [birth year] Today's Date: 05/11/2012 Pain:  None Group therapy Time:  1300-1400  (60 min)  Engaged in therapeutic activity group with focus on functional mobility in tub transfer, simple meal prep, and therapeutic exercise.  Pt. Performed tub bench transfer with minimal assist.  Ambulated around apartment with minimal assist and made peanut butter crackers with minimal assist to stabilize cracker.  Used nustep for 15 mins at 4 workload.     Lisa Roca 05/11/2012, 1:59 PM

## 2012-05-12 ENCOUNTER — Inpatient Hospital Stay (HOSPITAL_COMMUNITY): Payer: Medicaid Other | Admitting: Physical Therapy

## 2012-05-12 ENCOUNTER — Inpatient Hospital Stay (HOSPITAL_COMMUNITY): Payer: Medicaid Other

## 2012-05-12 DIAGNOSIS — I10 Essential (primary) hypertension: Secondary | ICD-10-CM

## 2012-05-12 DIAGNOSIS — Z5189 Encounter for other specified aftercare: Secondary | ICD-10-CM

## 2012-05-12 DIAGNOSIS — I6330 Cerebral infarction due to thrombosis of unspecified cerebral artery: Secondary | ICD-10-CM

## 2012-05-12 LAB — GLUCOSE, CAPILLARY
Glucose-Capillary: 137 mg/dL — ABNORMAL HIGH (ref 70–99)
Glucose-Capillary: 118 mg/dL — ABNORMAL HIGH (ref 70–99)

## 2012-05-12 NOTE — Progress Notes (Signed)
Physical Therapy Session Note  Patient Details  Name: [name] MRN: [id number] Date of Birth: [birth year]  Today's Date: 05/12/2012 Time: [id number] and 1400-1500 Time Calculation (min): 60 min and 60 min  Short Term Goals: Week 1:  PT Short Term Goal 1 (Week 1): Patient will perform bed <> w/c transfers consistently to L and R with min A PT Short Term Goal 1 - Progress (Week 1): Progressing toward goal PT Short Term Goal 2 (Week 1): Patient will perform w/c mobility on unit x 150 with L hemi technique and min A PT Short Term Goal 2 - Progress (Week 1): Met PT Short Term Goal 3 (Week 1): Patient will perform gait training on unit x 6' with LRAD and appropriate orthosis on RLE with min A  PT Short Term Goal 3 - Progress (Week 1): Progressing toward goal PT Short Term Goal 4 (Week 1): Patient will perform up and down one step/curb with LRAD and appropriate orthosis with mod A PT Short Term Goal 4 - Progress (Week 1): Met Week 2:  PT Short Term Goal 1 (Week 2): = LTG  Skilled Therapeutic Interventions/Progress Updates:   Patient requesting to don pants and shoes prior to PT session; patient performed pants donning in standing with min A to stabilize; in sitting patient performed sock donning and shoe with AFO donning with min A and verbal cues for problem solving.  Performed w/c mobility x 150' on unit and supervision with L hemi technique.  BP after w/c mobility: 152/98 and HR: 93 bpm.  Patient performed NMR standing in // bars with bilat UE support during RLE on soccer ball performing isolated muscle activation, motor control and coordination during controlled ball roll outs and in with mod A to maintain neutral hip rotation and isolated activation of quad for knee extension and hamstring for knee flexion.  Continued NMR for motor control, timing, sequencing and weight shift training during R lateral stepping to R x 4 resp with // bars and min-mod A for isolated hip ABD, placement and full  lateral weight shifting and to prevent trunk rotation for substitution of hip flexor muscle.  Gait training in // bars with bilat UE support and 2x4 on floor as visual cues for wider BOS to minimize scissoring gait during forwards and retro stepping with min A and verbal cues for full step and stride length, RLE placement and full anterior/posterior weight shifting with improved trunk elongation/upright trunk posture, improved R foot clearance and activation of RLE extensors during stance; multiple sitting rest breaks given to assess HR.  No reports of dizziness from patient during standing.  PM session: Performed w/c mobility x >150' on unit and w/c parts set up with supervision.  Performed transfer w/c <> mat with stand pivot and min-mod A for stability during pivoting.  Sit > supine on flat mat with min A and rolling supine <> prone with min A; performed NMR in prone with focus on decreasing activation of LLE and isolated concentric, isometric and eccentric hamstring activation for prone knee flexion; continued NMR for WB, sustained activation, weight shifting on RUE and RLE in quadruped during weight shifts over RUE and forced use during LUE reaching out of BOS and tall kneeling on mat with focus on full hip and trunk extension with knee flexed.  Multiple rest breaks in supine secondary to UE and LE fatigue.  Performed transfer training w/c <> car with stand pivot with mod A for safety and stability during pivot, for safe  hand placement and sequencing; patient able to place LE into and out of car simulator with supervision.    Therapy Documentation Precautions:  Precautions Precautions: Fall Precaution Comments: Monitor vitals: cease activity if BP >180/>110 and alert RN/MD.  DM, CAD with stents Required Braces or Orthoses: Other Brace/Splint (R foot AFO-applied in shoe with heel lift ) Restrictions Weight Bearing Restrictions: No Vital Signs: Therapy Vitals Pulse Rate: 93  BP: 152/98  mmHg Patient Position, if appropriate: Sitting Pain: Pain Assessment Pain Assessment: No/denies pain  See FIM for current functional status  Therapy/Group: Individual Therapy  Raylene Everts [facility] 05/12/2012, 12:11 PM

## 2012-05-12 NOTE — Progress Notes (Signed)
Patient ID: [name], male   DOB: [birth year], 65 y.o.   MRN: [id number] Subjective/Complaints: No further hypotensive episodes. Elevated BP this am recheck 152/98 after transfer no orthostatic issues today A 12 point review of systems has been performed and if not noted above is otherwise negative.   Objective: Vital Signs: Blood pressure 174/102, pulse 106, temperature 98.3 F (36.8 C), temperature source Oral, resp. rate 19, height 5\' 8"  (1.727 m), weight 86.5 kg (190 lb 11.2 oz), SpO2 93.00%. No results found. No results found for this basename: WBC:2,HGB:2,HCT:2,PLT:2 in the last 72 hours No results found for this basename: NA:2,K:2,CL:2,CO2:2,GLUCOSE:2,BUN:2,CREATININE:2,CALCIUM:2 in the last 72 hours CBG (last 3)   Basename 05/12/12 0717 05/11/12 2109 05/11/12 1630  GLUCAP 137* 98 114*    Wt Readings from Last 3 Encounters:  05/07/12 86.5 kg (190 lb 11.2 oz)  04/28/12 87.8 kg (193 lb 9 oz)  03/09/12 91.7 kg (202 lb 2.6 oz)    Physical Exam:  Nursing note and vitals reviewed.  Constitutional: He is oriented to person, place, and time. He appears well-developed and well-nourished.  HENT:  Head: Normocephalic and atraumatic.  Eyes: Pupils are equal, round, and reactive to light.  Neck: Normal range of motion. Neck supple.  Cardiovascular: Normal rate and regular rhythm. No murmurs or gallops  Pulmonary/Chest: Effort normal and breath sounds normal. No rales or wheezes, no distress  Abdominal: Soft. Bowel sounds are normal. Slightly distended Musculoskeletal: Normal range of motion. He exhibits no edema.  Neurological: He is alert and oriented to person, place, and time.  Able to identify simple objects. Follows all commnads. Speech is low volume but very intelligible. RUE is 1-2/5, RLE is 1-2/5 also. Sensation 1/2 on right. Right central 7 and tongue deviation  Skin: Skin is warm and dry.    Assessment/Plan: 1. 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 Physiatrist is providing close team supervision and 24 hour management of active medical problems listed below. Physiatrist and rehab team continue to assess barriers to discharge/monitor patient progress toward functional and medical goals. Team conference   FIM: FIM - Bathing Bathing Steps Patient Completed: Chest;Right Arm;Left Arm;Abdomen;Front perineal area;Buttocks;Right upper leg;Left upper leg;Right lower leg (including foot) Bathing: 6: More than reasonable amount of time  FIM - Upper Body Dressing/Undressing Upper body dressing/undressing steps patient completed: Thread/unthread left sleeve of pullover shirt/dress;Put head through opening of pull over shirt/dress Upper body dressing/undressing: 4: Steadying assist FIM - Lower Body Dressing/Undressing Lower body dressing/undressing steps patient completed: Pull underwear up/down;Thread/unthread right pants leg;Thread/unthread left pants leg;Pull pants up/down Lower body dressing/undressing: 5: Supervision: Safety issues/verbal cues  FIM - Toileting Toileting steps completed by patient: Adjust clothing prior to toileting;Performs perineal hygiene;Adjust clothing after toileting Toileting Assistive Devices: Grab bar or rail for support Toileting: 4: Steadying assist  FIM - Radio producer Devices: Elevated toilet seat Toilet Transfers: 4-To toilet/BSC: Min A (steadying Pt. > 75%);4-From toilet/BSC: Min A (steadying Pt. > 75%)  FIM - Bed/Chair Transfer Bed/Chair Transfer Assistive Devices: Copy: 3: Bed > Chair or W/C: Mod A (lift or lower assist);3: Chair or W/C > Bed: Mod A (lift or lower assist)  FIM - Locomotion: Wheelchair Distance: 150 Locomotion: Wheelchair: 3: Travels 150 ft or more: maneuvers on rugs and over door  sills with moderate assistance  (Pt: 50 - 74%) (only because he  needed assist of one of the leg rests) FIM - Locomotion: Ambulation Locomotion: Ambulation Assistive Devices: Walker - Rolling;Orthosis Ambulation/Gait Assistance: 3: Mod assist Locomotion: Ambulation: 1: Travels less than 50 ft with minimal assistance (Pt.>75%)  Comprehension Comprehension Mode: Auditory Comprehension: 6-Follows complex conversation/direction: With extra time/assistive device  Expression Expression Mode: Verbal Expression: 6-Expresses complex ideas: With extra time/assistive device  Social Interaction Social Interaction: 6-Interacts appropriately with others with medication or extra time (anti-anxiety, antidepressant).  Problem Solving Problem Solving: 6-Solves complex problems: With extra time  Memory Memory: 6-More than reasonable amt of time  Medical Problem List and Plan:  1. DVT Prophylaxis/Anticoagulation: Pharmaceutical: Lovenox  2. Pain Management: N/A  3. Mood: seems appropriate. Ego support by team 4. Neuropsych: This patient is capable of making decisions on his/her own behalf.  5.HTN: monitor with bid checks. Continue Imdur, Prinivil, norvac and coreg. Will not treat spikes with clonidine as drop in bp after clonidine may have helped precipitate extension. Slowly regulate bp with scheduled meds.Norvasc Dose increased 0n 8/30.  Coreg increased 9/1  May treat spikes with clonidine 6. DM type 2: monitor with bid checks. Resume metformin as studies without contrast and discontinue lantus. Fair control 7. Hypokalemia: low normal on admit labs.  8. Dyslipidemia: continue Lipitor.  9. CAD: continue Lipitor, coreg, Imdur and Prinivil. Now on ASA and plavix due to recent stent/CVA 10. CVA: extension of previous stroke on MRI. Speech has improved but still has more weakness on R side  Continue asa and plavix per neuro  -avoid overtreatment of bp- orthostatics abnormal x 1    LOS (Days) 11 A FACE  TO FACE EVALUATION WAS PERFORMED  KIRSTEINS,ANDREW E 05/12/2012, 9:53 AM

## 2012-05-12 NOTE — Progress Notes (Signed)
Physical Therapy Note  Patient Details  Name: LYN WITHEM MRN: [id number] Date of Birth: 1946-06-20 Today's Date: 05/12/2012  Time: 1525-1555 (30 minutes) individual Pain: no complaint of pain Precautions: If pt BP > 180/110 limit therapy to PROM supine/sitting  Focus of treatment: Neuro re-ed RT LE/ gait training without AD Treatment: transfers SPT wc><mat SBA with unilateral UE support; seated (raised mat) bilateral knee flexion/extension with min manual resistance; standing in tandem static stance alternating LEs SBA; gait sideways along mat min assist with decreased timing RT LE;gait forward /back 8 feet min assist with vcs to increase stride length backward to facilitate protective balance reactions and hamstrings; gait around circle X 3 min assist. Jariana Shumard,JIM 05/12/2012, 8:13 AM

## 2012-05-12 NOTE — Progress Notes (Signed)
Occupational Therapy Session Note  Patient Details  Name: [name] MRN: [id number] Date of Birth: [birth year]  Today's Date: 05/12/2012 Time: 1115-1200 Time Calculation (min): 45 min  Short Term Goals: Week 1:  OT Short Term Goal 1 (Week 1): Pt will complete UB dressing with supervision OT Short Term Goal 1 - Progress (Week 1): Met OT Short Term Goal 2 (Week 1): Pt will complete LB dressing with min assist OT Short Term Goal 2 - Progress (Week 1): Met OT Short Term Goal 3 (Week 1): Pt will complete toilet transfer with min assist using LRAD OT Short Term Goal 3 - Progress (Week 1): Met OT Short Term Goal 4 (Week 1): Pt will complete tub/shower transfer with min assist OT Short Term Goal 4 - Progress (Week 1): Progressing toward goal OT Short Term Goal 5 (Week 1): Pt will utilize RUE as diminished level in functional tasks with min cues OT Short Term Goal 5 - Progress (Week 1): Progressing toward goal  Skilled Therapeutic Interventions/Progress Updates:  ADL re-training session completed in shower this AM. Session with focus on sitting/standing balance, bathing, dressing and functional transfers. Patient performed very well during bathing and dressing. Patient was very motivated to do as much as he could for himself. Pt did not require any vc's for safety during ADL.  Therapy Documentation Precautions:  Precautions Precautions: Fall Precaution Comments: Monitor vitals: cease activity if BP >180/>110 and alert RN/MD.  DM, CAD with stents Required Braces or Orthoses: Other Brace/Splint (R foot AFO-applied in shoe with heel lift ) Restrictions Weight Bearing Restrictions: No Pain: Pain Assessment Pain Assessment: No/denies pain  See FIM for current functional status  Therapy/Group: Individual Therapy  Ailene Ravel, OTR/L 05/12/2012, 12:38 PM

## 2012-05-13 ENCOUNTER — Inpatient Hospital Stay (HOSPITAL_COMMUNITY): Payer: Medicaid Other | Admitting: Occupational Therapy

## 2012-05-13 ENCOUNTER — Inpatient Hospital Stay (HOSPITAL_COMMUNITY): Payer: Medicaid Other | Admitting: Physical Therapy

## 2012-05-13 DIAGNOSIS — I6330 Cerebral infarction due to thrombosis of unspecified cerebral artery: Secondary | ICD-10-CM

## 2012-05-13 DIAGNOSIS — I10 Essential (primary) hypertension: Secondary | ICD-10-CM

## 2012-05-13 DIAGNOSIS — Z5189 Encounter for other specified aftercare: Secondary | ICD-10-CM

## 2012-05-13 LAB — GLUCOSE, CAPILLARY
Glucose-Capillary: 150 mg/dL — ABNORMAL HIGH (ref 70–99)
Glucose-Capillary: 92 mg/dL (ref 70–99)

## 2012-05-13 NOTE — Progress Notes (Signed)
Physical Therapy Session Note  Patient Details  Name: [name] MRN: [id number] Date of Birth: Jul 04, 1946  Today's Date: 05/13/2012 Time: 1100-1200 and 1505-1600 Time Calculation (min): 60 min and 55 min  Short Term Goals: Week 1:  PT Short Term Goal 1 (Week 1): Patient will perform bed <> w/c transfers consistently to L and R with min A PT Short Term Goal 1 - Progress (Week 1): Progressing toward goal PT Short Term Goal 2 (Week 1): Patient will perform w/c mobility on unit x 150 with L hemi technique and min A PT Short Term Goal 2 - Progress (Week 1): Met PT Short Term Goal 3 (Week 1): Patient will perform gait training on unit x 37' with LRAD and appropriate orthosis on RLE with min A  PT Short Term Goal 3 - Progress (Week 1): Progressing toward goal PT Short Term Goal 4 (Week 1): Patient will perform up and down one step/curb with LRAD and appropriate orthosis with mod A PT Short Term Goal 4 - Progress (Week 1): Met Week 2:  PT Short Term Goal 1 (Week 2): = LTG  Skilled Therapeutic Interventions/Progress Updates:   Patient participated in ambulation group with focus on bilat UE and LE coordination, endurance and strengthening on Nustep at level 6 resistance x 8 minutes at RPE 13.  Performed stair training for home entry/exit up and down one step curb with RW x 4 reps with mod A overall for safety and sequence.  Higher level gait training with R and L lateral stepping, retro stepping, and figure 8 walking around cones with RW and Allard brace with min-mod A for facilitation of L lateral weight shift and R step length; hip and ankle balance training during sit <> stand from chair without UE support x 10 reps.    PM session:  Patient performed w/c mobility on unit x 150' x 2 with supervision.  Performed pivot to mat with min-mod A and sit > supine on mat for NMR with min A.  Unable to tolerate prone so performed RLE NMR in supine during 10-12 reps each AAROM and sustained activation of  RLE hip IR with foot up on therapy ball, R hamstring knee flexion with R foot up on therapy ball (HS curls), knee flexion with hip extension bridges all with increased tactile cues and verbal cues needed for isometric and eccentric control.  In standing performed NMR for lateral weight shifting and postural control for R trunk elongation and increased chest expansion during R lateral weight shifting with RUE ABD and elbow extension for WB into wall for activation of triceps and rotator cuff muscles and minimizing substitution of upper trap muscle; changed to PNF diagonals with squat reaching with RUE across midline to low target and then coming to stand with R lateral weight shift reaching high to target on wall with mod A to maintain shoulder ABD  Gait training:  With bilat UE support on walking sticks x 40' with mod A to sequence alternating UE and LE advancement for facilitation of WB through UE extension, trunk rotation and more normal gait sequence with UE swing.  Improved RLE advancement with little to no scissoring during advancement.  Therapy Documentation Precautions:  Precautions Precautions: Fall Precaution Comments: Monitor vitals: cease activity if BP >180/>110 and alert RN/MD.  DM, CAD with stents Required Braces or Orthoses: Other Brace/Splint (R foot AFO-applied in shoe with heel lift ) Restrictions Weight Bearing Restrictions: No Vital Signs: Therapy Vitals Pulse Rate: 84  BP:  148/83 mmHg Patient Position, if appropriate: Sitting Oxygen Therapy SpO2: 100 % O2 Device: None (Room air) Pulse Oximetry Type: Intermittent Pain: Pain Assessment Pain Assessment: No/denies pain  See FIM for current functional status  Therapy/Group: Individual Therapy and Group Therapy  Raylene Everts [facility] 05/13/2012, 12:31 PM

## 2012-05-13 NOTE — Progress Notes (Signed)
Patient ID: [name], male   DOB: [birth year], 66 y.o.   MRN: [id number] Subjective/Complaints: No further hypotensive episodes.issues today A 12 point review of systems has been performed and if not noted above is otherwise negative.   Objective: Vital Signs: Blood pressure 174/116, pulse 99, temperature 98.4 F (36.9 C), temperature source Oral, resp. rate 18, height 5\' 8"  (1.727 m), weight 86.5 kg (190 lb 11.2 oz), SpO2 99.00%. No results found. No results found for this basename: WBC:2,HGB:2,HCT:2,PLT:2 in the last 72 hours No results found for this basename: NA:2,K:2,CL:2,CO2:2,GLUCOSE:2,BUN:2,CREATININE:2,CALCIUM:2 in the last 72 hours CBG (last 3)   Basename 05/13/12 0733 05/12/12 2057 05/12/12 1635  GLUCAP 150* 118* 185*    Wt Readings from Last 3 Encounters:  05/07/12 86.5 kg (190 lb 11.2 oz)  04/28/12 87.8 kg (193 lb 9 oz)  03/09/12 91.7 kg (202 lb 2.6 oz)    Physical Exam:  Nursing note and vitals reviewed.  Constitutional: He is oriented to person, place, and time. He appears well-developed and well-nourished.  HENT:  Head: Normocephalic and atraumatic.  Eyes: Pupils are equal, round, and reactive to light.  Neck: Normal range of motion. Neck supple.  Cardiovascular: Normal rate and regular rhythm. No murmurs or gallops  Pulmonary/Chest: Effort normal and breath sounds normal. No rales or wheezes, no distress  Abdominal: Soft. Bowel sounds are normal. Slightly distended Musculoskeletal: Normal range of motion. He exhibits no edema.  Neurological: He is alert and oriented to person, place, and time.  Able to identify simple objects. Follows all commnads. Speech is low volume but very intelligible. RUE is 1-2/5, RLE is 1-2/5 also. Sensation 1/2 on right. Right central 7 and tongue deviation  Skin: Skin is warm and dry.    Assessment/Plan: 1. 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 which  require 3+ hours per day of interdisciplinary therapy in a comprehensive inpatient rehab setting. Physiatrist is providing close team supervision and 24 hour management of active medical problems listed below. Physiatrist and rehab team continue to assess barriers to discharge/monitor patient progress toward functional and medical goals. Team conference   FIM: FIM - Bathing Bathing Steps Patient Completed: Chest;Right Arm;Left Arm;Abdomen;Front perineal area;Buttocks;Right upper leg;Left upper leg;Right lower leg (including foot);Left lower leg (including foot) Bathing: 5: Supervision: Safety issues/verbal cues  FIM - Upper Body Dressing/Undressing Upper body dressing/undressing steps patient completed: Thread/unthread right sleeve of pullover shirt/dresss;Thread/unthread left sleeve of pullover shirt/dress;Put head through opening of pull over shirt/dress;Pull shirt over trunk Upper body dressing/undressing: 5: Supervision: Safety issues/verbal cues FIM - Lower Body Dressing/Undressing Lower body dressing/undressing steps patient completed: Thread/unthread right underwear leg;Thread/unthread left underwear leg;Pull underwear up/down;Thread/unthread right pants leg;Thread/unthread left pants leg;Pull pants up/down;Don/Doff right sock;Don/Doff left sock;Don/Doff right shoe;Don/Doff left shoe;Fasten/unfasten right shoe;Fasten/unfasten left shoe Lower body dressing/undressing: 4: Steadying Assist  FIM - Toileting Toileting steps completed by patient: Adjust clothing prior to toileting;Performs perineal hygiene;Adjust clothing after toileting Toileting Assistive Devices: Grab bar or rail for support Toileting: 4: Steadying assist  FIM - Radio producer Devices: Elevated toilet seat Toilet Transfers: 4-To toilet/BSC: Min A (steadying Pt. > 75%);4-From toilet/BSC: Min A (steadying Pt. > 75%)  FIM - Bed/Chair Transfer Bed/Chair Transfer Assistive Devices:  Copy: 4: Bed > Chair or W/C: Min A (steadying Pt. > 75%);4: Chair or W/C > Bed: Min A (steadying Pt. > 75%)  FIM - Locomotion: Wheelchair Distance: 150 Locomotion: Wheelchair: 5: Travels 150 ft or more: maneuvers  on rugs and over door sills with supervision, cueing or coaxing FIM - Locomotion: Ambulation Locomotion: Ambulation Assistive Devices: Parallel bars Ambulation/Gait Assistance: 4: Min assist Locomotion: Ambulation: 1: Travels less than 50 ft with minimal assistance (Pt.>75%)  Comprehension Comprehension Mode: Auditory Comprehension: 6-Follows complex conversation/direction: With extra time/assistive device  Expression Expression Mode: Verbal Expression: 6-Expresses complex ideas: With extra time/assistive device  Social Interaction Social Interaction: 6-Interacts appropriately with others with medication or extra time (anti-anxiety, antidepressant).  Problem Solving Problem Solving: 6-Solves complex problems: With extra time  Memory Memory: 6-More than reasonable amt of time  Medical Problem List and Plan:  1. DVT Prophylaxis/Anticoagulation: Pharmaceutical: Lovenox  2. Pain Management: N/A  3. Mood: seems appropriate. Ego support by team 4. Neuropsych: This patient is capable of making decisions on his/her own behalf.  5.HTN: uncontrolled. Continue Imdur, Prinivil, norvasc and coreg.Slowly regulate bp with scheduled meds.Norvasc Dose increased 0n 8/30.  Coreg increased 9/1 May increase Imdur May treat spikes with clonidine- Check for renal artery stenosis 6. DM type 2: monitor with bid checks. Resume metformin as studies without contrast and discontinue lantus. Fair control 7. Hypokalemia: low normal on admit labs.  8. Dyslipidemia: continue Lipitor.  9. CAD: continue Lipitor, coreg, Imdur and Prinivil. Now on ASA and plavix due to recent stent/CVA 10. CVA: extension of previous stroke on MRI. Speech has improved but still has more weakness on R  side  Continue asa and plavix per neuro  -avoid overtreatment of bp- orthostatics abnormal x 1 but now are wnl    LOS (Days) 12 A FACE TO FACE EVALUATION WAS PERFORMED  KIRSTEINS,ANDREW E 05/13/2012, 8:08 AM

## 2012-05-13 NOTE — Progress Notes (Signed)
Occupational Therapy Session Note  Patient Details  Name: SKIP MAREZ MRN: [id number] Date of Birth: 1946/08/31  Today's Date: 05/13/2012 Time: 0915-1000 and 1330-1400 Time Calculation (min): 45 min and 30 min  Short Term Goals: Week 2:  OT Short Term Goal 1 (Week 2): Pt will complete LB dressing with supervision. OT Short Term Goal 2 (Week 2): Pt will complete toilet transfer with supervision with LRAD. OT Short Term Goal 3 (Week 2): Pt will complete 3 grooming tasks in standing with supervision. OT Short Term Goal 4 (Week 2): Pt will complete tub/shower transfer with min assist OT Short Term Goal 5 (Week 2): Pt will utilize RUE as diminished level in functional tasks with min cues   Skilled Therapeutic Interventions/Progress Updates:    1) Pt seen for ADL retraining with focus on dynamic sitting and standing balance with self-care tasks, functional transfers within ADL, and forced use of RUE with self-care tasks.  Pt overall distant supervision with bathing and dressing tasks at walk-in shower level, requiring little to no verbal cues for safety.  Pt requires min assist with opening containers, but is very motivated to do for himself and will attempt tasks prior to asking for help.  2) 1:1 OT with focus on RUE ROM and forced use with therapeutic activities in sitting and standing.  Tone noted in pecs with external rotation and horizontal abduction  Wall pushups in standing and PNF pattern reaching in sitting and standing with focus on increased ROM in RUE with AAROM.  Therapy Documentation Precautions:  Precautions Precautions: Fall Precaution Comments: Monitor vitals: cease activity if BP >180/>110 and alert RN/MD.  DM, CAD with stents Required Braces or Orthoses: Other Brace/Splint (R foot AFO-applied in shoe with heel lift ) Restrictions Weight Bearing Restrictions: No General:   Vital Signs: Therapy Vitals Pulse Rate: 87  BP: 115/79 mmHg Patient Position, if appropriate:  Sitting Pain:  Pt with no c/o pain this session.  See FIM for current functional status  Therapy/Group: Individual Therapy  Sim Boast 05/13/2012, 12:22 PM

## 2012-05-14 ENCOUNTER — Inpatient Hospital Stay (HOSPITAL_COMMUNITY): Payer: Medicaid Other | Admitting: Physical Therapy

## 2012-05-14 ENCOUNTER — Inpatient Hospital Stay (HOSPITAL_COMMUNITY): Payer: Self-pay | Admitting: Physical Therapy

## 2012-05-14 ENCOUNTER — Inpatient Hospital Stay (HOSPITAL_COMMUNITY): Payer: Medicaid Other | Admitting: Occupational Therapy

## 2012-05-14 DIAGNOSIS — I10 Essential (primary) hypertension: Secondary | ICD-10-CM

## 2012-05-14 LAB — GLUCOSE, CAPILLARY
Glucose-Capillary: 146 mg/dL — ABNORMAL HIGH (ref 70–99)
Glucose-Capillary: 95 mg/dL (ref 70–99)

## 2012-05-14 MED ORDER — POLYETHYLENE GLYCOL 3350 17 G PO PACK
17.0000 g | PACK | Freq: Two times a day (BID) | ORAL | Status: AC
  Administered 2012-05-14: 17 g via ORAL
  Filled 2012-05-14 (×2): qty 1

## 2012-05-14 MED ORDER — SENNOSIDES-DOCUSATE SODIUM 8.6-50 MG PO TABS
2.0000 | ORAL_TABLET | Freq: Every day | ORAL | Status: DC
  Administered 2012-05-14 – 2012-05-20 (×5): 2 via ORAL
  Filled 2012-05-14 (×6): qty 2

## 2012-05-14 NOTE — Progress Notes (Signed)
Physical Therapy Session Note  Patient Details  Name: [name] MRN: [id number] Date of Birth: April 20, 1946  Today's Date: 05/14/2012 Time: 1130-1205 Time Calculation (min): 35 min  Short Term Goals: Week 2:  PT Short Term Goal 1 (Week 2): = LTG    Therapy Documentation Precautions:  Precautions Precautions: Fall Precaution Comments: Monitor vitals: cease activity if BP >180/>110 and alert RN/MD.  DM, CAD with stents Required Braces or Orthoses: Other Brace/Splint (R foot AFO-applied in shoe with heel lift ) Restrictions Weight Bearing Restrictions: No   Vital Signs: Therapy Vitals Pulse Rate: 95  BP: 119/74 mmHg Patient Position, if appropriate: Sitting Pain: Pain Assessment Pain Assessment: No/denies pain Locomotion : Ambulation Ambulation: Yes Ambulation/Gait Assistance: 2: Max assist Ambulation Distance (Feet): 35 Feet   Patient ambulated 35 feet with bilateral walking poles and mod assist x 2. Patient required assist with progressing walking pole with RUE and required steadying of walking pole in LUE. Patient required verbal cues for sequencing, step length, posture and weight shift. Patient presented with increased difficulty with weight shifting bilaterally and clearing and stepping with RLE today, which was not seen yesterday with gait training.   NMR: To encourage anterior and lateral weight shift and R foot clearance with gait, patient performed reaching task with stepping with RLE and anterior weight shift over RLE. Patient required visual and verbal cues to achieve correct form of hip and knee extension and pelvic rotation.  Patient become tearful at end of session when speaking with friend. PT provided encouragement and emotional support. Patient was taken back to room by wheelchair.   See FIM for current functional status  Therapy/Group: Individual Therapy  Eri Mcevers 05/14/2012, 12:15 PM

## 2012-05-14 NOTE — Progress Notes (Signed)
Patient ID: [name], male   DOB: 06-17-1946, 66 y.o.   MRN: [id number] Subjective/Complaints: No further hypotensive episodes.issues today A 12 point review of systems has been performed and if not noted above is otherwise negative.  Review of Systems  Gastrointestinal: Positive for constipation.  All other systems reviewed and are negative.    Objective: Vital Signs: Blood pressure 141/87, pulse 90, temperature 98 F (36.7 C), temperature source Oral, resp. rate 19, height 5\' 8"  (1.727 m), weight 86.5 kg (190 lb 11.2 oz), SpO2 99.00%. No results found. No results found for this basename: WBC:2,HGB:2,HCT:2,PLT:2 in the last 72 hours No results found for this basename: NA:2,K:2,CL:2,CO2:2,GLUCOSE:2,BUN:2,CREATININE:2,CALCIUM:2 in the last 72 hours CBG (last 3)   Basename 05/14/12 0718 05/13/12 2056 05/13/12 1629  GLUCAP 147* 144* 80    Wt Readings from Last 3 Encounters:  05/07/12 86.5 kg (190 lb 11.2 oz)  04/28/12 87.8 kg (193 lb 9 oz)  03/09/12 91.7 kg (202 lb 2.6 oz)    Physical Exam:  Nursing note and vitals reviewed.  Constitutional: He is oriented to person, place, and time. He appears well-developed and well-nourished.  HENT:  Head: Normocephalic and atraumatic.  Eyes: Pupils are equal, round, and reactive to light.  Neck: Normal range of motion. Neck supple.  Cardiovascular: Normal rate and regular rhythm. No murmurs or gallops  Pulmonary/Chest: Effort normal and breath sounds normal. No rales or wheezes, no distress  Abdominal: Soft. Bowel sounds are normal. Slightly distended Musculoskeletal: Normal range of motion. He exhibits no edema.  Neurological: He is alert and oriented to person, place, and time.  Able to identify simple objects. Follows all commnads. Speech is low volume but very intelligible. RUE is 2/5, RLE is 2/5 -3-/5. Sensation 1/2 on right. Right central 7 and tongue deviation  Skin: Skin is warm and dry.    Assessment/Plan: 1. 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 Physiatrist is providing close team supervision and 24 hour management of active medical problems listed below. Physiatrist and rehab team continue to assess barriers to discharge/monitor patient progress toward functional and medical goals. Team conference   FIM: FIM - Bathing Bathing Steps Patient Completed: Chest;Right Arm;Left Arm;Abdomen;Front perineal area;Buttocks;Right upper leg;Left upper leg;Right lower leg (including foot);Left lower leg (including foot) Bathing: 5: Supervision: Safety issues/verbal cues  FIM - Upper Body Dressing/Undressing Upper body dressing/undressing steps patient completed: Thread/unthread right sleeve of pullover shirt/dresss;Thread/unthread left sleeve of pullover shirt/dress;Put head through opening of pull over shirt/dress;Pull shirt over trunk Upper body dressing/undressing: 5: Supervision: Safety issues/verbal cues FIM - Lower Body Dressing/Undressing Lower body dressing/undressing steps patient completed: Thread/unthread right underwear leg;Thread/unthread left underwear leg;Pull underwear up/down;Thread/unthread right pants leg;Thread/unthread left pants leg;Pull pants up/down;Don/Doff right sock;Don/Doff left sock;Don/Doff left shoe;Fasten/unfasten left shoe Lower body dressing/undressing: 4: Min-Patient completed 75 plus % of tasks  FIM - Toileting Toileting steps completed by patient: Adjust clothing prior to toileting;Performs perineal hygiene;Adjust clothing after toileting Toileting Assistive Devices: Grab bar or rail for support Toileting: 4: Steadying assist  FIM - Radio producer Devices: Elevated toilet seat Toilet Transfers: 4-To toilet/BSC: Min A (steadying Pt. > 75%);4-From toilet/BSC: Min A (steadying Pt. >  75%)  FIM - Bed/Chair Transfer Bed/Chair Transfer Assistive Devices: Bed rails;Arm rests Bed/Chair Transfer: 5: Supine > Sit: Supervision (verbal cues/safety issues);5: Sit > Supine: Supervision (verbal cues/safety issues);4: Bed > Chair  or W/C: Min A (steadying Pt. > 75%);4: Chair or W/C > Bed: Min A (steadying Pt. > 75%)  FIM - Locomotion: Wheelchair Distance: 150 Locomotion: Wheelchair: 1: Total Assistance/staff pushes wheelchair (Pt<25%) FIM - Locomotion: Ambulation Locomotion: Ambulation Assistive Devices: Walker - Rolling;Orthosis Ambulation/Gait Assistance: 3: Mod assist Locomotion: Ambulation: 3: Travels 150 ft or more with moderate assistance (Pt: 50 - 74%)  Comprehension Comprehension Mode: Auditory Comprehension: 6-Follows complex conversation/direction: With extra time/assistive device  Expression Expression Mode: Verbal Expression: 5-Expresses basic needs/ideas: With extra time/assistive device  Social Interaction Social Interaction: 6-Interacts appropriately with others with medication or extra time (anti-anxiety, antidepressant).  Problem Solving Problem Solving: 6-Solves complex problems: With extra time  Memory Memory: 6-Assistive device: No helper  Medical Problem List and Plan:  1. DVT Prophylaxis/Anticoagulation: Pharmaceutical: Lovenox  2. Pain Management: N/A  3. Mood: seems appropriate. Ego support by team 4. Neuropsych: This patient is capable of making decisions on his/her own behalf.  5.HTN: uncontrolled. Continue Imdur, Prinivil, norvasc and coreg.Slowly regulate bp with scheduled meds.Norvasc Dose increased 0n 8/30.  Coreg increased 9/1 May increase Imdur May treat spikes with clonidine- Check for renal artery stenosis 6. DM type 2: monitor with bid checks. Resume metformin as studies without contrast and discontinue lantus. Fair control 7. Hypokalemia: low normal on admit labs.  8. Dyslipidemia: continue Lipitor.  9. CAD: continue Lipitor, coreg,  Imdur and Prinivil. Now on ASA and plavix due to recent stent/CVA 10. CVA: extension of previous stroke on MRI. Speech has improved but still has more weakness on R side  Continue asa and plavix per neuro  -avoid overtreatment of bp- orthostatics abnormal x 1 but now are wnl    LOS (Days) 13 A FACE TO FACE EVALUATION WAS PERFORMED  Horald Birky E 05/14/2012, 8:07 AM

## 2012-05-14 NOTE — Progress Notes (Signed)
Occupational Therapy Session Note  Patient Details  Name: [name] MRN: [id number] Date of Birth: 1945/10/28  Today's Date: 05/14/2012 Time: [id number] and [id number] Time Calculation (min): 43 min and 33 min  Short Term Goals: Week 2:  OT Short Term Goal 1 (Week 2): Pt will complete LB dressing with supervision. OT Short Term Goal 2 (Week 2): Pt will complete toilet transfer with supervision with LRAD. OT Short Term Goal 3 (Week 2): Pt will complete 3 grooming tasks in standing with supervision. OT Short Term Goal 4 (Week 2): Pt will complete tub/shower transfer with min assist OT Short Term Goal 5 (Week 2): Pt will utilize RUE as diminished level in functional tasks with min cues   Skilled Therapeutic Interventions/Progress Updates:    1) Pt seen for ADL retraining with focus on dynamic sitting and standing balance with self-care tasks, functional transfers within ADL, and forced use of RUE with self-care tasks.  Pt completing grooming tasks at sink upon arrival. Pt overall distant supervision with bathing and dressing tasks at walk-in shower level, requiring little to no verbal cues for safety. Pt required min assist with LB dressing secondary to requiring physical assist to don Rt shoe with AFO.  Pt requires min assist with opening containers, but is very motivated to do for himself and will attempt tasks prior to asking for help.   2) 1:1 OT with focus on NM Re-ed in sidelying and supine.  Engaged in  scapular mobes to RUE in sidelying with focus on increased activation with scapular elevation/depression and protraction/retraction with gentle gliding to promote stretch to end range.  Shoulder flexion in sidelying with focus on coupling shoulder flexion with elbow extension progressing to providing min resistance.  BUE chest presses with ball to promote BUE movement with physical support and activation at elbow to move through range against gravity.  Pt with continued tightness and  slight discomfort with Rt shoulder external rotation and almost any movement in adduction greater than 70 degrees.  Stretching and gentle massage at pecs to attempt to release.  Therapy Documentation Precautions:  Precautions Precautions: Fall Precaution Comments: Monitor vitals: cease activity if BP >180/>110 and alert RN/MD.  DM, CAD with stents Required Braces or Orthoses: Other Brace/Splint (R foot AFO-applied in shoe with heel lift ) Restrictions Weight Bearing Restrictions: No Pain:  Pt with no c/o pain this session.  See FIM for current functional status  Therapy/Group: Individual Therapy  Sim Boast 05/14/2012, 11:03 AM

## 2012-05-14 NOTE — Progress Notes (Signed)
Social Work Patient ID: [name], male   DOB: 17-Jul-1946, 66 y.o.   MRN: [id number] Met with pt and wife to inform of team conference progression toward goals and discharge 9/11.  Pt wants to progress more quickly Than he is.  Wife is supportive and will take pt to OP rehab at discharge.  Will have Medicaid worker contact wife due to stressor regarding hospital Bill and med cost.  Will continue to work on discharge needs.

## 2012-05-14 NOTE — Progress Notes (Signed)
Physical Therapy Session Note  Patient Details  Name: GIOMAR ROHS MRN: [id number] Date of Birth: [birth year]  Today's Date: 05/14/2012 Time: [id number] Time Calculation (min): 25 min  Short Term Goals: Week 2:  PT Short Term Goal 1 (Week 2): = LTG  S:  Pt reported that he needed to work on his walking the most.  Skilled Therapeutic Interventions/Progress Updates:    See below for details.  Therapy Documentation Precautions:  Precautions Precautions: Fall Precaution Comments: Monitor vitals: cease activity if BP >180/>110 and alert RN/MD.  DM, CAD with stents Required Braces or Orthoses: Other Brace/Splint (R foot AFO-applied in shoe with heel lift ) Restrictions Weight Bearing Restrictions: No Vital Signs:  BP:  122/80 Pain: Pain Assessment Pain Assessment: No/denies pain Mobility:  Sit to stand with min@ Locomotion : Ambulation Ambulation: Yes Ambulation/Gait Assistance: 4: Min assist Ambulation Distance (Feet): 90' and 32' Initially performing gait with walking poles, pt having difficulty using them due to weakness in R hand and stated he thought he could do better without them.  Changed to using R HHA, pt wearing [place] AFO.  Gait pattern was improved with pt needing less facilitation.  Occasional knee hyperextension on R in stance phase.     Other Treatments:   Kinetron in standing to address knee control in stance phase and increase muscle strength on 20 cm2, also addressing postural and trunk alignment. See FIM for current functional status  Therapy/Group: Individual Therapy  Waylan Boga 05/14/2012, 4:03 PM

## 2012-05-14 NOTE — Progress Notes (Signed)
Physical Therapy Session Note  Patient Details  Name: [name] MRN: [id number] Date of Birth: 1946-08-08  Today's Date: 05/14/2012 Time: 1300-1400 Time Calculation (min): 60 min  Short Term Goals: Week 2:  PT Short Term Goal 1 (Week 2): = LTG  Skilled Therapeutic Interventions/Progress Updates:    pt indicates no pain this pm, but states hasn't been a great day.  W/C mobility with L UE and L LE >150' with S.  Transfers with MinA to Nutep on level 5 for 78mins.  Amb with RW Mingu[place] 160' then amb backwards and side steps Bil with MinA with RW.  Sit to stands from living room type chair with [place] working on strength and control, repeated x5 reps.  W/C mobility to return to room with S.    Therapy Documentation Precautions:  Precautions Precautions: Fall Precaution Comments: Monitor vitals: cease activity if BP >180/>110 and alert RN/MD.  DM, CAD with stents Required Braces or Orthoses: Other Brace/Splint (R foot AFO-applied in shoe with heel lift ) Restrictions Weight Bearing Restrictions: No Vital Signs: Therapy Vitals Pulse Rate: 95  BP: 113/71 mmHg Patient Position, if appropriate: Sitting Pain: Pain Assessment Pain Assessment: No/denies pain  See FIM for current functional status  Therapy/Group: Individual Therapy  Bardia Wangerin, Thornton Papas 05/14/2012, 1:17 PM

## 2012-05-14 NOTE — Progress Notes (Signed)
I have read and agree with the following treatment session note.  Raylene Everts, PT, DPT

## 2012-05-14 NOTE — Progress Notes (Signed)
*  PRELIMINARY RESULTS* Vascular Ultrasound Renal artery duplex has been completed.  Preliminary findings: Bilaterally no evidence of renal artery stenosis.  Landry Mellow, RDMS, RVT  05/14/2012, 9:01 AM

## 2012-05-14 NOTE — Patient Care Conference (Signed)
Inpatient RehabilitationTeam Conference Note Date: 05/14/2012   Time: 11:15 AM    Patient Name: [name]      Medical Record Number: [id number]  Date of Birth: 1946-03-08 Sex: Male         Room/Bed: 4036/4036-01 Payor Info: Payor: MEDICAID POTENTIAL  Plan: MEDICAID POTENTIAL  Product Type: *No Product type*     Admitting Diagnosis: L BG-CVA  Admit Date/Time:  05/01/2012  2:03 PM Admission Comments: No comment available   Primary Diagnosis:  Acute ischemic stroke Principal Problem: Acute ischemic stroke  Patient Active Problem List   Diagnosis Date Noted  . Acute ischemic stroke 04/28/2012  . Hypertensive emergency 04/28/2012  . Hyperglycemia 04/28/2012  . Dyslipidemia, (HDL 25) 03/10/2012  . NSTEMI - Occluded SVG-OM, s/p BMS 03/08/12 03/08/2012  . DM (diabetes mellitus),poorly controlled 03/07/2012  . 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 Alysia Penna Nurse Present: Andria Meuse, RN PT Present: Altamese Dilling, PTA;Raylene Everts, PT OT Present: Antony Salmon, Ellen Henri, OT SLP Present: Gunnar Fusi, SLP Other (Discipline and Name): Amado Nash Coordinator     Current Status/Progress Goal Weekly Team Focus  Medical   No further hypotensive episodes, blood pressures are slowly coming down on medications,  Assess for renal artery stenosis  Check imaging studies   Bowel/Bladder   continent bowel/bladder lbm 8/31  remain continent   remain continent    Swallow/Nutrition/ Hydration             ADL's   supervision bathing and UB dsg, min assist LB dsg, min assist transfers  supervision bathing and dressing, mod I toileting and toilet transfer, supervision simple meal prep   RUE NM re-ed, dynamic standing balance, functional ambulation with self-care tasks, family education   Mobility   supervision w/c mobility, min-mod A  transfers and standing/gait  supervision-mod I overall   Decreasing assistance with transfers, gait, standing balance   Communication             Safety/Cognition/ Behavioral Observations            Pain   no complaints of pain  no complaints of pain   no complaints of pain    Skin   no skin breakdown  no skin breakdown   no skin breakdown       *See Interdisciplinary Assessment and Plan and progress notes for long and short-term goals  Barriers to Discharge: See above    Possible Resolutions to Barriers:  Further medication management may need consultation    Discharge Planning/Teaching Needs:  Home with wife who has been here to participat ein therapies.  Making progress in therapies.      Team Discussion:  Continues to make slow gains.  BP coming down ultra sound of kidneys to rule out stenosis today. Wife here to participate in therapies and learn pt's care.  Revisions to Treatment Plan:  None   Continued Need for Acute Rehabilitation Level of Care: The patient requires daily medical management by a physician with specialized training in physical medicine and rehabilitation for the following conditions: Daily direction of a multidisciplinary physical rehabilitation program to ensure safe treatment while eliciting the highest outcome that is of practical value to the patient.: Yes Daily medical management of patient stability for increased activity during participation in an intensive rehabilitation regime.: Yes Daily analysis of laboratory values and/or radiology reports with any  subsequent need for medication adjustment of medical intervention for : Cardiac problems;Neurological problems  Elease Hashimoto 05/14/2012, 1:41 PM

## 2012-05-15 ENCOUNTER — Inpatient Hospital Stay (HOSPITAL_COMMUNITY): Payer: Medicaid Other | Admitting: Occupational Therapy

## 2012-05-15 ENCOUNTER — Inpatient Hospital Stay (HOSPITAL_COMMUNITY): Payer: Medicaid Other | Admitting: *Deleted

## 2012-05-15 LAB — GLUCOSE, CAPILLARY
Glucose-Capillary: 125 mg/dL — ABNORMAL HIGH (ref 70–99)
Glucose-Capillary: 76 mg/dL (ref 70–99)

## 2012-05-15 MED ORDER — LISINOPRIL 20 MG PO TABS
20.0000 mg | ORAL_TABLET | Freq: Every day | ORAL | Status: DC
  Administered 2012-05-16 – 2012-05-20 (×5): 20 mg via ORAL
  Filled 2012-05-15 (×7): qty 1

## 2012-05-15 NOTE — Progress Notes (Signed)
Occupational Therapy Session Note  Patient Details  Name: [name] MRN: [id number] Date of Birth: 01/26/1946  Today's Date: 05/15/2012 Time: 0930-1030 and 1300-1345 Time Calculation (min): 60 min and 45 min  Short Term Goals: Week 2:  OT Short Term Goal 1 (Week 2): Pt will complete LB dressing with supervision. OT Short Term Goal 2 (Week 2): Pt will complete toilet transfer with supervision with LRAD. OT Short Term Goal 3 (Week 2): Pt will complete 3 grooming tasks in standing with supervision. OT Short Term Goal 4 (Week 2): Pt will complete tub/shower transfer with min assist OT Short Term Goal 5 (Week 2): Pt will utilize RUE as diminished level in functional tasks with min cues   Skilled Therapeutic Interventions/Progress Updates:    1) Pt seen for ADL retraining with focus on dynamic sitting and standing balance with self-care tasks, functional transfers within ADL, and forced use of RUE with self-care tasks. Pt supervision stand pivot transfer from w/c to shower chair with use of grab bar. Pt overall distant supervision with bathing and dressing tasks at walk-in shower level, requiring little to no verbal cues for safety. Pt required min assist with LB dressing secondary to requiring physical assist to don Rt shoe with AFO. Engaged in [place] tasks with focus on increased use of RUE with ball exercises for bimanual activities and activities in standing to focus on sit <> stand, dynamic standing balance, and forced use of RUE.  2) 1:1 OT with focus on NM re-ed with use of RUE with focus on tip to tip grasp and release, external rotation, supination/pronation, and elbow extension.  Use of 1" blocks with tip to tip grasp and release with assist provided at elbow and wrist to decrease gravity.  Bimanual activities in sitting with use of ball to promote weight shifting, elbow extension, and supination/pronation.  Pt reports tightness in pecs with external rotation and abduction movements,  provided some gentle stretching and massage with pt reporting some relief.  Therapy Documentation Precautions:  Precautions Precautions: Fall Precaution Comments: Monitor vitals: cease activity if BP >180/>110 and alert RN/MD.  DM, CAD with stents Required Braces or Orthoses: Other Brace/Splint (R foot AFO-applied in shoe with heel lift ) Restrictions Weight Bearing Restrictions: No General:   Vital Signs: Therapy Vitals Pulse Rate: 92  BP: 109/73 mmHg Pain: Pain Assessment Pain Assessment: No/denies pain  See FIM for current functional status  Therapy/Group: Individual Therapy  Sim Boast 05/15/2012, 12:22 PM

## 2012-05-15 NOTE — Progress Notes (Signed)
Physical Therapy Session Note  Patient Details  Name: [name] MRN: [id number] Date of Birth: [birth year]  Today's Date: 05/15/2012 Time: [id number]; second session: 1435-1520 Time Calculation (min): 40 min; 45 min     Therapy Documentation Precautions:  Precautions Precautions: Fall Precaution Comments: Monitor vitals: cease activity if BP >180/>110 and alert RN/MD.  DM, CAD with stents Required Braces or Orthoses: Other Brace/Splint (R foot AFO-applied in shoe with heel lift ) Restrictions Weight Bearing Restrictions: No   Pain: Pain Assessment Pain Assessment: No/denies pain   Vitals: Second session: BP: 115/73 HR: 93 bpm  Locomotion : Ambulation Ambulation: Yes Ambulation/Gait Assistance: 4: Min assist   Patient began session by ambulating 200 feet with HHA and min to mod A. Patient presented with Trendelenburg type gait, requiring facilitation at R hip during stance to assist abductors. Patient required mod A to recover from loss of balance, which patient lost his balance ~ 5 times (all to the R) secondary to decreased base of support and difficulty clearing RLE.  Patient ended session by ambulating from gym back to his room (~250 feet) with HHA and min to mod A. Again, required min assist for facilitation at R hip and mod A to help correct with loss of balance.   Second session: Patient ambulated ~25 feet to parallel bars with HHA and min assist. Patient then ambulated sideward in parallel bars 12 feet x 2 reps with UE support, RLE leading for R abductor strengthening, and min assist. Patient required verbal and tactile cues for posture, adductor activation, and form throughout side stepping. Patient also performed backward stepping x 10 reps bilaterally to activate hip extensors. Patient then ambulated backward x 30 feet with HHA and min assist. Patient presented with decreased step length and step width which required verbal cues for correction. Patient ambulated 30  feet at end of session with HHA and min assist and no losses of balance.   Exercises: Attempted R hip abduction in sidelying, however patient unable to isolate abductors and compensated with quads. Patient was then moved to supine and performed hip abduction x 5 with pillowcase under R foot, hip abduction x 5 with MaxiSlide under R foot. Patient had increased difficulty with exercise and began to get increased knee flexion with fatigue.  Sidelying clam shells with RLE x 10 reps for hip abductor/adductor strengthening. Clam shells in hooklying with resistance to abductors x 10 for hip abductor/adductor strengthening.  Bridges with 3 second hold x 10 for hip extension strengthening.  PT assisted patient with adductor stretch and piriformis stretch x 30 seconds x 3 reps.  Second session: Hip extension in quadruped was attempted bilaterally, however patient was unable to perform secondary to weakness. Hip extension strengthening was performed through backward stepping and backward gait training (see above).  Lateral step up/downs were performed with RLE on 2 inch step x 10 reps with emphasis on stabilizing right hip when ascending and controlling descending motion. Patient required verbal and tactile cues for correct form.  Standing hip abduction x 10 reps on RLE for strengthening.    See FIM for current functional status  Therapy/Group: Individual Therapy  MITRISIN, HEATHER 05/15/2012, 12:20 PM

## 2012-05-15 NOTE — Progress Notes (Signed)
Patient ID: [name], male   DOB: 05-04-1946, 66 y.o.   MRN: [id number] Subjective/Complaints: No further hypotensive episodes.issues today A 12 point review of systems has been performed and if not noted above is otherwise negative.  Review of Systems  Gastrointestinal: Positive for constipation.  All other systems reviewed and are negative.    Objective: Vital Signs: Blood pressure 155/106, pulse 99, temperature 97.5 F (36.4 C), temperature source Oral, resp. rate 19, height 5\' 8"  (1.727 m), weight 86 kg (189 lb 9.5 oz), SpO2 98.00%. No results found. No results found for this basename: WBC:2,HGB:2,HCT:2,PLT:2 in the last 72 hours No results found for this basename: NA:2,K:2,CL:2,CO2:2,GLUCOSE:2,BUN:2,CREATININE:2,CALCIUM:2 in the last 72 hours CBG (last 3)   Basename 05/15/12 0722 05/14/12 2133 05/14/12 1658  GLUCAP 125* 95 92    Wt Readings from Last 3 Encounters:  05/14/12 86 kg (189 lb 9.5 oz)  04/28/12 87.8 kg (193 lb 9 oz)  03/09/12 91.7 kg (202 lb 2.6 oz)    Physical Exam:  Nursing note and vitals reviewed.  Constitutional: He is oriented to person, place, and time. He appears well-developed and well-nourished.  HENT:  Head: Normocephalic and atraumatic.  Eyes: Pupils are equal, round, and reactive to light.  Neck: Normal range of motion. Neck supple.  Cardiovascular: Normal rate and regular rhythm. No murmurs or gallops  Pulmonary/Chest: Effort normal and breath sounds normal. No rales or wheezes, no distress  Abdominal: Soft. Bowel sounds are normal. Slightly distended Musculoskeletal: Normal range of motion. He exhibits no edema.  Neurological: He is alert and oriented to person, place, and time.  Able to identify simple objects. Follows all commnads. Speech is low volume but very intelligible. RUE is 2/5, RLE is 2/5 -3-/5. Sensation 1/2 on right. Right central 7 and tongue deviation  Skin: Skin is warm and dry.    Assessment/Plan: 1. 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 Physiatrist is providing close team supervision and 24 hour management of active medical problems listed below. Physiatrist and rehab team continue to assess barriers to discharge/monitor patient progress toward functional and medical goals. Team conference   FIM: FIM - Bathing Bathing Steps Patient Completed: Chest;Right Arm;Left Arm;Abdomen;Front perineal area;Buttocks;Right upper leg;Left upper leg;Right lower leg (including foot);Left lower leg (including foot) Bathing: 5: Supervision: Safety issues/verbal cues  FIM - Upper Body Dressing/Undressing Upper body dressing/undressing steps patient completed: Thread/unthread left sleeve of pullover shirt/dress;Put head through opening of pull over shirt/dress;Pull shirt over trunk Upper body dressing/undressing: 4: Min-Patient completed 75 plus % of tasks FIM - Lower Body Dressing/Undressing Lower body dressing/undressing steps patient completed: Thread/unthread right underwear leg;Thread/unthread left underwear leg;Pull underwear up/down;Thread/unthread right pants leg;Thread/unthread left pants leg;Pull pants up/down;Don/Doff right sock;Don/Doff left sock;Don/Doff left shoe Lower body dressing/undressing: 4: Min-Patient completed 75 plus % of tasks  FIM - Toileting Toileting steps completed by patient: Adjust clothing prior to toileting;Performs perineal hygiene;Adjust clothing after toileting Toileting Assistive Devices: Grab bar or rail for support Toileting: 4: Steadying assist  FIM - Radio producer Devices: Elevated toilet seat Toilet Transfers: 4-To toilet/BSC: Min A (steadying Pt. > 75%);4-From toilet/BSC: Min A (steadying Pt. > 75%)  FIM - Bed/Chair Transfer Bed/Chair Transfer Assistive Devices: Bed  rails;Arm rests Bed/Chair Transfer: 5: Supine > Sit: Supervision (verbal cues/safety issues);5: Sit > Supine: Supervision (verbal cues/safety issues);4: Bed > Chair or W/C: Min A (  steadying Pt. > 75%);4: Chair or W/C > Bed: Min A (steadying Pt. > 75%)  FIM - Locomotion: Wheelchair Distance: 150 Locomotion: Wheelchair: 5: Travels 150 ft or more: maneuvers on rugs and over door sills with supervision, cueing or coaxing FIM - Locomotion: Ambulation Locomotion: Ambulation Assistive Devices: Administrator Ambulation/Gait Assistance: 4: Min assist Locomotion: Ambulation: 4: Travels 150 ft or more with minimal assistance (Pt.>75%)  Comprehension Comprehension Mode: Auditory Comprehension: 6-Follows complex conversation/direction: With extra time/assistive device  Expression Expression Mode: Verbal Expression: 6-Expresses complex ideas: With extra time/assistive device  Social Interaction Social Interaction: 6-Interacts appropriately with others with medication or extra time (anti-anxiety, antidepressant).  Problem Solving Problem Solving: 6-Solves complex problems: With extra time  Memory Memory: 6-Assistive device: No helper  Medical Problem List and Plan:  1. DVT Prophylaxis/Anticoagulation: Pharmaceutical: Lovenox  2. Pain Management: N/A  3. Mood: seems appropriate. Ego support by team 4. Neuropsych: This patient is capable of making decisions on his/her own behalf.  5.HTN: uncontrolled. Continue Imdur, Prinivil, norvasc and coreg.Slowly regulate bp with scheduled meds.Norvasc Dose increased 0n 8/30.  Coreg increased 9/1, Increase prinivil to 20mg  BID May increase Imdur May treat spikes with clonidine- no evidence  for renal artery stenosis 6. DM type 2: monitor with bid checks. Resume metformin as studies without contrast and discontinue lantus. Fair control 7. Hypokalemia: low normal on admit labs.  8. Dyslipidemia: continue Lipitor.  9. CAD: continue Lipitor, coreg, Imdur and  Prinivil. Now on ASA and plavix due to recent stent/CVA 10. CVA: extension of previous stroke on MRI. Speech has improved but still has more weakness on R side  Continue asa and plavix per neuro      LOS (Days) 14 A FACE TO FACE EVALUATION WAS PERFORMED  KIRSTEINS,ANDREW E 05/15/2012, 7:49 AM

## 2012-05-16 ENCOUNTER — Inpatient Hospital Stay (HOSPITAL_COMMUNITY): Payer: Medicaid Other | Admitting: Occupational Therapy

## 2012-05-16 ENCOUNTER — Inpatient Hospital Stay (HOSPITAL_COMMUNITY): Payer: Medicaid Other | Admitting: Physical Therapy

## 2012-05-16 LAB — GLUCOSE, CAPILLARY: Glucose-Capillary: 83 mg/dL (ref 70–99)

## 2012-05-16 MED ORDER — ISOSORBIDE MONONITRATE ER 60 MG PO TB24
60.0000 mg | ORAL_TABLET | Freq: Every day | ORAL | Status: DC
  Administered 2012-05-17 – 2012-05-19 (×3): 60 mg via ORAL
  Filled 2012-05-16 (×4): qty 1

## 2012-05-16 NOTE — Progress Notes (Signed)
Occupational Therapy Weekly Progress Note and Treatment Session  Patient Details  Name: [name] MRN: [id number] Date of Birth: 15-Feb-1946  Today's Date: 05/16/2012 Time: 0915-1000 and 1300-1345 Time Calculation (min): 45 min and 45 min  Patient has met 3 of 5 short term goals.  Pt is showing steady progress towards goals.  He is at an overall supervision with bathing and dressing, except requiring assist to don Rt shoe secondary to AFO.  Pt continues to require min assist with ambulation with RW and min assist with walk-in and tub/shower transfers due to RLE weakness.  Plan to incorporate more functional transfers and ambulation into treatment sessions.  Pt's BP has been more manageable this week which has allowed him to further participate in therapy sessions.  Pt has expressed some increased tone/tightness in RUE at shoulder girdle/pecs which is limiting some progress with RUE use.  Patient continues to demonstrate the following deficits: Rt hemiplegia, RUE weakness and decreased ROM, impaired motor control, coordination, impaired dyamic standing balance and therefore will continue to benefit from skilled OT intervention to enhance overall performance with BADL and Reduce care partner burden.  Patient progressing toward long term goals..  Continue plan of care.  OT Short Term Goals Week 2:  OT Short Term Goal 1 (Week 2): Pt will complete LB dressing with supervision. OT Short Term Goal 1 - Progress (Week 2): Progressing toward goal OT Short Term Goal 2 (Week 2): Pt will complete toilet transfer with supervision with LRAD. OT Short Term Goal 2 - Progress (Week 2): Progressing toward goal OT Short Term Goal 3 (Week 2): Pt will complete 3 grooming tasks in standing with supervision. OT Short Term Goal 3 - Progress (Week 2): Met OT Short Term Goal 4 (Week 2): Pt will complete tub/shower transfer with min assist OT Short Term Goal 4 - Progress (Week 2): Met OT Short Term Goal 5 (Week  2): Pt will utilize RUE as diminished level in functional tasks with min cues  OT Short Term Goal 5 - Progress (Week 2): Met Week 3:  OT Short Term Goal 1 (Week 3): Pt will complete LB dressing with supervision OT Short Term Goal 2 (Week 3): Pt will complete toilet transfer with supervision with RW OT Short Term Goal 3 (Week 3): Pt will complet tub/shower transfer with supervision   Skilled Therapeutic Interventions/Progress Updates:    1)Pt seen for ADL retraining with focus on dynamic sitting and standing balance with self-care tasks, functional transfers within ADL, and forced use of RUE with self-care tasks. Pt completing grooming at sink without assist upon arrival.  Pt min assist ambulating with RW to walk-in shower, secondary to requiring steady assist with ambulation and cues for Rt foot placement/advancement. Pt overall distant supervision with bathing and dressing tasks at walk-in shower level, requiring little to no verbal cues for safety. Pt required min assist with LB dressing secondary to requiring physical assist to don Rt shoe with AFO.   2) 1:1 OT with focus on NM Re-ed in supine and standing.  Pt with reported tightness in RUE, especially with external rotation and abduction movements.  Engaged in gentle AAROM and stretching through end range, movement through minimal resistance and against gravity.  Incorporated use of therapy ball to promote bimanual tasks and further promote shoulder flexion and elbow extension with activity.  Engaged in weight bearing through [place] at high-low table with weight shifting through [place]. Pt completed 2 reps of 10 wall pushup with assist at Rt  wrist/hand to maintain contact with wall to further promote wrist flexion.  Therapy Documentation Precautions:  Precautions Precautions: Fall Precaution Comments: Monitor vitals: cease activity if BP >180/>110 and alert RN/MD.  DM, CAD with stents Required Braces or Orthoses: Other Brace/Splint (R foot AFO-applied  in shoe with heel lift ) Restrictions Weight Bearing Restrictions: No General:   Vital Signs: Therapy Vitals BP: 121/83 mmHg Patient Position, if appropriate: Sitting Pain: Pain Assessment Pain Assessment: No/denies pain ADL: ADL Equipment Provided:  (shoe button) Grooming: Modified independent Where Assessed-Grooming: Sitting at sink Upper Body Bathing: Supervision/safety Where Assessed-Upper Body Bathing: Shower Lower Body Bathing: Supervision/safety Where Assessed-Lower Body Bathing: Shower Upper Body Dressing: Setup Where Assessed-Upper Body Dressing: Sitting at sink Lower Body Dressing: Minimal assistance Where Assessed-Lower Body Dressing: Sitting at sink;Standing at sink Toileting: Minimal assistance Where Assessed-Toileting: Glass blower/designer: Psychiatric nurse Method: Ambulating (with RW) Science writer: Grab bars Tub/Shower Transfer: Minimal Museum/gallery conservator Method: Stand pivot Tub/Shower Equipment: Facilities manager: Environmental education officer Method: Ambulating (with RW) Youth worker: Civil engineer, contracting with back;Grab bars ADL Comments: Pt distant supervision with self-care tasks except min assist with transfers at ambulation level and assist to don Rt shoe secondary to AFO.  See FIM for current functional status  Therapy/Group: Individual Therapy  Sim Boast 05/16/2012, 10:39 AM

## 2012-05-16 NOTE — Progress Notes (Signed)
Patient ID: [name], male   DOB: 10/17/1945, 66 y.o.   MRN: [id number] Subjective/Complaints: No further hypotensive episodes.issues today A 12 point review of systems has been performed and if not noted above is otherwise negative.  Review of Systems  Gastrointestinal: Positive for constipation.  All other systems reviewed and are negative.    Objective: Vital Signs: Blood pressure 152/103, pulse 87, temperature 97.9 F (36.6 C), temperature source Oral, resp. rate 22, height 5\' 8"  (1.727 m), weight 86 kg (189 lb 9.5 oz), SpO2 99.00%. No results found. No results found for this basename: WBC:2,HGB:2,HCT:2,PLT:2 in the last 72 hours No results found for this basename: NA:2,K:2,CL:2,CO2:2,GLUCOSE:2,BUN:2,CREATININE:2,CALCIUM:2 in the last 72 hours CBG (last 3)   Basename 05/16/12 0730 05/15/12 2117 05/15/12 1120  GLUCAP 150* 76 103*    Wt Readings from Last 3 Encounters:  05/14/12 86 kg (189 lb 9.5 oz)  04/28/12 87.8 kg (193 lb 9 oz)  03/09/12 91.7 kg (202 lb 2.6 oz)    Physical Exam:  Nursing note and vitals reviewed.  Constitutional: He is oriented to person, place, and time. He appears well-developed and well-nourished.  HENT:  Head: Normocephalic and atraumatic.  Eyes: Pupils are equal, round, and reactive to light.  Neck: Normal range of motion. Neck supple.  Cardiovascular: Normal rate and regular rhythm. No murmurs or gallops  Pulmonary/Chest: Effort normal and breath sounds normal. No rales or wheezes, no distress  Abdominal: Soft. Bowel sounds are normal. Slightly distended Musculoskeletal: Normal range of motion. He exhibits no edema.  Neurological: He is alert and oriented to person, place, and time.  Able to identify simple objects. Follows all commnads. Speech is low volume but very intelligible. RUE is 2/5, RLE is 2/5 -3-/5. Sensation 1/2 on right. Right central 7 and tongue deviation  Skin: Skin is warm and dry.    Assessment/Plan: 1. 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 Physiatrist is providing close team supervision and 24 hour management of active medical problems listed below. Physiatrist and rehab team continue to assess barriers to discharge/monitor patient progress toward functional and medical goals. Team conference   FIM: FIM - Bathing Bathing Steps Patient Completed: Chest;Right Arm;Left Arm;Abdomen;Front perineal area;Buttocks;Right upper leg;Left upper leg;Right lower leg (including foot);Left lower leg (including foot) Bathing: 5: Supervision: Safety issues/verbal cues  FIM - Upper Body Dressing/Undressing Upper body dressing/undressing steps patient completed: Thread/unthread right sleeve of pullover shirt/dresss;Thread/unthread left sleeve of pullover shirt/dress;Put head through opening of pull over shirt/dress;Pull shirt over trunk Upper body dressing/undressing: 5: Set-up assist to: Obtain clothing/put away FIM - Lower Body Dressing/Undressing Lower body dressing/undressing steps patient completed: Thread/unthread right underwear leg;Thread/unthread left underwear leg;Pull underwear up/down;Thread/unthread right pants leg;Thread/unthread left pants leg;Pull pants up/down;Don/Doff right sock;Don/Doff left sock;Don/Doff left shoe Lower body dressing/undressing: 4: Min-Patient completed 75 plus % of tasks  FIM - Toileting Toileting steps completed by patient: Adjust clothing prior to toileting;Performs perineal hygiene;Adjust clothing after toileting Toileting Assistive Devices: Grab bar or rail for support Toileting: 4: Steadying assist  FIM - Radio producer Devices: Elevated toilet seat Toilet Transfers: 4-To toilet/BSC: Min A (steadying Pt. > 75%);4-From toilet/BSC: Min A (steadying Pt. > 75%)  FIM - Bed/Chair  Transfer Bed/Chair Transfer Assistive Devices: Bed rails;Arm rests Bed/Chair Transfer: 5: Supine > Sit: Supervision (verbal cues/safety issues);4: Sit > Supine: Min A (steadying pt. > 75%/lift 1  leg);4: Bed > Chair or W/C: Min A (steadying Pt. > 75%);4: Chair or W/C > Bed: Min A (steadying Pt. > 75%)  FIM - Locomotion: Wheelchair Distance: 150 Locomotion: Wheelchair: 0: Activity did not occur FIM - Locomotion: Ambulation Locomotion: Ambulation Assistive Devices: Other (comment) (HHA) Ambulation/Gait Assistance: 4: Min assist Locomotion: Ambulation: 4: Travels 150 ft or more with minimal assistance (Pt.>75%)  Comprehension Comprehension Mode: Auditory Comprehension: 6-Follows complex conversation/direction: With extra time/assistive device  Expression Expression Mode: Verbal Expression: 6-Expresses complex ideas: With extra time/assistive device  Social Interaction Social Interaction: 6-Interacts appropriately with others with medication or extra time (anti-anxiety, antidepressant).  Problem Solving Problem Solving: 6-Solves complex problems: With extra time  Memory Memory: 6-Assistive device: No helper  Medical Problem List and Plan:  1. DVT Prophylaxis/Anticoagulation: Pharmaceutical: Lovenox  2. Pain Management: N/A  3. Mood: seems appropriate. Ego support by team 4. Neuropsych: This patient is capable of making decisions on his/her own behalf.  5.HTN: uncontrolled. Continue Imdur, Prinivil, norvasc and coreg.Slowly regulate bp with scheduled meds.Norvasc Dose increased 0n 8/30.  Coreg increased 9/1, Increase prinivil to 20mg  BID , increase Imdur to 60mg May treat spikes with clonidine- no evidence  for renal artery stenosis 6. DM type 2: monitor with bid checks. Resume metformin as studies without contrast and discontinue lantus. Fair control 7. Hypokalemia: low normal on admit labs.  8. Dyslipidemia: continue Lipitor.  9. CAD: continue Lipitor, coreg, Imdur and Prinivil. Now  on ASA and plavix due to recent stent/CVA 10. CVA: extension of previous stroke on MRI. Speech has improved but still has more weakness on R side  Continue asa and plavix per neuro      LOS (Days) 15 A FACE TO FACE EVALUATION WAS PERFORMED  KIRSTEINS,ANDREW E 05/16/2012, 7:44 AM

## 2012-05-16 NOTE — Progress Notes (Signed)
Physical Therapy Weekly Progress Note  Patient Details  Name: [name] MRN: [id number] Date of Birth: [birth year]  Today's Date: 05/16/2012 Time: 1116-1201 and [id number] Time Calculation (min): 45 min and 58 min  Patient continues to make good progress and has met 1 of 10 Long term goals with more stabilized BP and better ability to participate in therapy sessions.  Patient is currently mod I w/c mobility on unit, supervision-min A for bed mobility and bed <> w/c transfers, min-mod A for gait training and curb negotiation with RW.  Patient has been fit with [place] foot up brace and is demonstrating improved standing postural control and gait sequence with improved foot clearance and decreased scissoring.    Patient continues to demonstrate the following deficits: impaired activity tolerance, R sided weakness, impaired motor control, timing, sequencing, coordination, impaired dynamic standing balance, gait and therefore will continue to benefit from skilled PT intervention to enhance overall performance with activity tolerance, balance, postural control, ability to compensate for deficits, functional use of  right upper extremity and right lower extremity and coordination.  Patient progressing toward long term goals..  Continue plan of care.  PT Short Term Goals Week 2:  PT Short Term Goal 1 (Week 2): = LTG PT Short Term Goal 1 - Progress (Week 2): Progressing toward goal  Skilled Therapeutic Interventions/Progress Updates:   Assessed gait in controlled environment with use of quad cane x 25' and SPC x 25' with mod A overall for lateral and anterior weight shifting, cues for full RLE advancement/foot clearance with intermittent posterior LOB secondary to R posterior pelvic retraction.  Assessed curb negotiation with quad cane up and down one step curb x 2 reps with mod A overall and verbal cues for safe sequence with AD, safe stepping sequence and for lifting assistance and forward weight  shift and cues to fully advance/clear RLE on and off curb; still presents with R pelvic posterior retraction.  Discussed with patient pros and cons of use of RW vs cane and falls risk, endurance, functional mobility.  Patient feels that the cane would give him more independence with use of UE.  Gait sequence training in // bars with RLE isolated hip and knee flexion for foot and LE clearance during swing phase and with R semi tandem stance performed active hip and knee extension and pelvic forward rotation against resistance of theraband for more anterior weight shift over RLE in stance.     PM session: Gait training with RW x 50' with min A controlled environment with intermittent verbal and tactile cues for full R hip extension during mid-terminal stance; use of green line on floor is visual cue for wider BOS.  Continued gait training without RW x 75' in controlled environment with RUE support in full forward flexion overhead for trunk elongation for improved terminal hip extension and anterior pelvic rotation during R stance.    NMR: For anterior weight shifting, motor control/timing/sequencing of R hip extension and pelvic rotation during RLE step ups onto 4" step with bilat UE support and tactile cues for full hip, knee and trunk extension >> RUE reaching into forward flexion pushing into resistance for increased extension activation.  Continued NMR for trunk and pelvis rotation training during reaching with LUE >> bilat UE in PNF diagonal patterns down and to the R >> up and to the L with squat > stand with continued cues for full R trunk elongation, hip and knee extension, anterior rotation.    Therapeutic Activity: Demonstrated  to patient how to perform floor > furniture transfers and indications for calling EMS.  Patient performed floor > loveseat transfer through quadruped > tall kneeling > half kneeling > stand with mod A for lifting to full stand and to bring RLE into BOS.  Will continue to  practice.  Patient states that his sister may have a RW for him to use upon D/C; sister to bring in RW this weekend or by Monday to assess height and appropriateness.  Therapy Documentation Precautions:  Precautions Precautions: Fall Precaution Comments: Monitor vitals: cease activity if BP >180/>110 and alert RN/MD.  DM, CAD with stents Required Braces or Orthoses: Other Brace/Splint (R foot AFO-applied in shoe with heel lift ) Restrictions Weight Bearing Restrictions: No Vital Signs: Therapy Vitals Pulse Rate: 91  BP: 123/84 mmHg Patient Position, if appropriate: Sitting Pain: Pain Assessment Pain Assessment: No/denies pain  See FIM for current functional status  Therapy/Group: Individual Therapy  Raylene Everts Faucette 05/16/2012, 12:02 PM

## 2012-05-17 ENCOUNTER — Inpatient Hospital Stay (HOSPITAL_COMMUNITY): Payer: Medicaid Other | Admitting: *Deleted

## 2012-05-17 DIAGNOSIS — I10 Essential (primary) hypertension: Secondary | ICD-10-CM

## 2012-05-17 DIAGNOSIS — I6330 Cerebral infarction due to thrombosis of unspecified cerebral artery: Secondary | ICD-10-CM

## 2012-05-17 DIAGNOSIS — Z5189 Encounter for other specified aftercare: Secondary | ICD-10-CM

## 2012-05-17 LAB — GLUCOSE, CAPILLARY
Glucose-Capillary: 129 mg/dL — ABNORMAL HIGH (ref 70–99)
Glucose-Capillary: 167 mg/dL — ABNORMAL HIGH (ref 70–99)

## 2012-05-17 NOTE — Progress Notes (Signed)
Patient ID: [name], male   DOB: April 14, 1946, 66 y.o.   MRN: [id number] Subjective/Complaints: Good night. No complaints  A 12 point review of systems has been performed and if not noted above is otherwise negative.  Review of Systems  Gastrointestinal: Positive for constipation.  All other systems reviewed and are negative.    Objective: Vital Signs: Blood pressure 122/86, pulse 91, temperature 98.5 F (36.9 C), temperature source Oral, resp. rate 18, height 5\' 8"  (1.727 m), weight 86 kg (189 lb 9.5 oz), SpO2 98.00%. No results found. No results found for this basename: WBC:2,HGB:2,HCT:2,PLT:2 in the last 72 hours No results found for this basename: NA:2,K:2,CL:2,CO2:2,GLUCOSE:2,BUN:2,CREATININE:2,CALCIUM:2 in the last 72 hours CBG (last 3)   Basename 05/16/12 2222 05/16/12 1648 05/16/12 1203  GLUCAP 83 92 117*    Wt Readings from Last 3 Encounters:  05/14/12 86 kg (189 lb 9.5 oz)  04/28/12 87.8 kg (193 lb 9 oz)  03/09/12 91.7 kg (202 lb 2.6 oz)    Physical Exam:  Nursing note and vitals reviewed.  Constitutional: He is oriented to person, place, and time. He appears well-developed and well-nourished.  HENT:  Head: Normocephalic and atraumatic.  Eyes: Pupils are equal, round, and reactive to light.  Neck: Normal range of motion. Neck supple.  Cardiovascular: Normal rate and regular rhythm. No murmurs or gallops  Pulmonary/Chest: Effort normal and breath sounds normal. No rales or wheezes, no distress  Abdominal: Soft. Bowel sounds are normal. Slightly distended Musculoskeletal: Normal range of motion. He exhibits no edema.  Neurological: He is alert and oriented to person, place, and time.  Able to identify simple objects. Follows all commnads. Speech improved as has language. RUE is 2-4/5, RLE is 2/5 -3-/5. Sensation 1/2 on right. Right central 7 and tongue deviation  Skin: Skin is warm and dry.    Assessment/Plan: 1. 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 Physiatrist is providing close team supervision and 24 hour management of active medical problems listed below. Physiatrist and rehab team continue to assess barriers to discharge/monitor patient progress toward functional and medical goals. Team conference   FIM: FIM - Bathing Bathing Steps Patient Completed: Chest;Right Arm;Left Arm;Abdomen;Front perineal area;Buttocks;Right upper leg;Left upper leg;Right lower leg (including foot);Left lower leg (including foot) Bathing: 5: Supervision: Safety issues/verbal cues  FIM - Upper Body Dressing/Undressing Upper body dressing/undressing steps patient completed: Thread/unthread right sleeve of pullover shirt/dresss;Thread/unthread left sleeve of pullover shirt/dress;Put head through opening of pull over shirt/dress;Pull shirt over trunk Upper body dressing/undressing: 5: Set-up assist to: Obtain clothing/put away FIM - Lower Body Dressing/Undressing Lower body dressing/undressing steps patient completed: Thread/unthread right underwear leg;Thread/unthread left underwear leg;Pull underwear up/down;Thread/unthread right pants leg;Thread/unthread left pants leg;Pull pants up/down;Don/Doff right sock;Don/Doff left sock;Don/Doff left shoe;Fasten/unfasten left shoe Lower body dressing/undressing: 4: Min-Patient completed 75 plus % of tasks  FIM - Toileting Toileting steps completed by patient: Adjust clothing prior to toileting;Performs perineal hygiene;Adjust clothing after toileting Toileting Assistive Devices: Grab bar or rail for support Toileting: 4: Steadying assist  FIM - Radio producer Devices: Grab bars Toilet Transfers: 4-From toilet/BSC: Min A (steadying Pt. > 75%);4-To toilet/BSC: Min A (steadying Pt. > 75%)  FIM - Bed/Chair  Transfer Bed/Chair Transfer Assistive Devices: Arm rests;Bed rails;HOB elevated Bed/Chair Transfer: 5: Supine > Sit: Supervision (verbal cues/safety issues);5: Sit > Supine: Supervision (verbal cues/safety issues);4: Bed > Chair  or W/C: Min A (steadying Pt. > 75%);4: Chair or W/C > Bed: Min A (steadying Pt. > 75%)  FIM - Locomotion: Wheelchair Distance: 150 Locomotion: Wheelchair: 6: Travels 150 ft or more, turns around, maneuvers to table, bed or toilet, negotiates 3% grade: maneuvers on rugs and over door sills independently FIM - Locomotion: Ambulation Locomotion: Ambulation Assistive Devices: Cane - Straight;Cane - Quad;Orthosis Ambulation/Gait Assistance: 3: Mod assist Locomotion: Ambulation: 1: Travels less than 50 ft with moderate assistance (Pt: 50 - 74%)  Comprehension Comprehension Mode: Auditory Comprehension: 6-Follows complex conversation/direction: With extra time/assistive device  Expression Expression Mode: Verbal Expression: 6-Expresses complex ideas: With extra time/assistive device  Social Interaction Social Interaction: 6-Interacts appropriately with others with medication or extra time (anti-anxiety, antidepressant).  Problem Solving Problem Solving: 6-Solves complex problems: With extra time  Memory Memory: 6-Assistive device: No helper  Medical Problem List and Plan:  1. DVT Prophylaxis/Anticoagulation: Pharmaceutical: Lovenox  2. Pain Management: N/A  3. Mood: seems appropriate. Ego support by team 4. Neuropsych: This patient is capable of making decisions on his/her own behalf.  5.HTN: uncontrolled. Continue Imdur, Prinivil, norvasc and coreg.Slowly regulate bp with scheduled meds.Norvasc Dose increased 0n 8/30.  Coreg increased 9/1, Increase prinivil to 20mg  BID , increase Imdur to 60mg May treat spikes with clonidine- no evidence  for renal artery stenosis 6. DM type 2: monitor with bid checks. Resume metformin as studies without contrast and discontinue  lantus. Fair control 7. Hypokalemia: low normal on admit labs.  8. Dyslipidemia: continue Lipitor.  9. CAD: continue Lipitor, coreg, Imdur and Prinivil. Now on ASA and plavix due to recent stent/CVA 10. CVA: extension of previous stroke on MRI. Speech has improved but still has more weakness on R side  Continue asa and plavix per neuro      LOS (Days) 16 A FACE TO FACE EVALUATION WAS PERFORMED  SWARTZ,ZACHARY T 05/17/2012, 5:13 AM

## 2012-05-17 NOTE — Progress Notes (Addendum)
Physical Therapy Note  Patient Details  Name: [name] MRN: [id number] Date of Birth: 10/30/1945 Today's Date: 05/17/2012  1300-1355 (55 minutes) group Pain: no complaint of pain Pt participated in PT group focused on gait training/safety/endurance. Pt ambulates 80 feet X 2 with RW + hand splint RT close SBA; hip flexion in sidelying with manual resistance.   Marcin Holte,JIM 05/17/2012, 8:42 AM

## 2012-05-18 ENCOUNTER — Inpatient Hospital Stay (HOSPITAL_COMMUNITY): Payer: Medicaid Other | Admitting: Physical Therapy

## 2012-05-18 LAB — GLUCOSE, CAPILLARY
Glucose-Capillary: 83 mg/dL (ref 70–99)
Glucose-Capillary: 81 mg/dL (ref 70–99)
Glucose-Capillary: 74 mg/dL (ref 70–99)

## 2012-05-18 MED ORDER — CARVEDILOL 25 MG PO TABS
37.5000 mg | ORAL_TABLET | Freq: Two times a day (BID) | ORAL | Status: DC
  Administered 2012-05-18 – 2012-05-21 (×6): 37.5 mg via ORAL
  Filled 2012-05-18 (×8): qty 1

## 2012-05-18 NOTE — Progress Notes (Signed)
Patient ID: [name], male   DOB: [birth year], 66 y.o.   MRN: [id number] Subjective/Complaints: Good night. No complaints  A 12 point review of systems has been performed and if not noted above is otherwise negative.  Review of Systems  Gastrointestinal: Positive for constipation.  All other systems reviewed and are negative.    Objective: Vital Signs: Blood pressure 140/92, pulse 91, temperature 97.9 F (36.6 C), temperature source Oral, resp. rate 20, height 5\' 8"  (1.727 m), weight 86 kg (189 lb 9.5 oz), SpO2 98.00%. No results found. No results found for this basename: WBC:2,HGB:2,HCT:2,PLT:2 in the last 72 hours No results found for this basename: NA:2,K:2,CL:2,CO2:2,GLUCOSE:2,BUN:2,CREATININE:2,CALCIUM:2 in the last 72 hours CBG (last 3)   Basename 05/17/12 2146 05/17/12 1626 05/17/12 1201  GLUCAP 95 95 167*    Wt Readings from Last 3 Encounters:  05/14/12 86 kg (189 lb 9.5 oz)  04/28/12 87.8 kg (193 lb 9 oz)  03/09/12 91.7 kg (202 lb 2.6 oz)    Physical Exam:  Nursing note and vitals reviewed.  Constitutional: He is oriented to person, place, and time. He appears well-developed and well-nourished.  HENT:  Head: Normocephalic and atraumatic.  Eyes: Pupils are equal, round, and reactive to light.  Neck: Normal range of motion. Neck supple.  Cardiovascular: Normal rate and regular rhythm. No murmurs or gallops  Pulmonary/Chest: Effort normal and breath sounds normal. No rales or wheezes, no distress  Abdominal: Soft. Bowel sounds are normal. Slightly distended Musculoskeletal: Normal range of motion. He exhibits no edema.  Neurological: He is alert and oriented to person, place, and time.  Able to identify simple objects. Follows all commnads. Speech improved as has language. RUE is 2-4/5, RLE is 2/5 -3-/5. Sensation 1/2 on right. Right central 7 and tongue deviation  Skin: Skin is warm and dry.    Assessment/Plan: 1. 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 Physiatrist is providing close team supervision and 24 hour management of active medical problems listed below. Physiatrist and rehab team continue to assess barriers to discharge/monitor patient progress toward functional and medical goals. Team conference   FIM: FIM - Bathing Bathing Steps Patient Completed: Chest;Right Arm;Left Arm;Abdomen;Front perineal area;Buttocks;Right upper leg;Left upper leg;Right lower leg (including foot);Left lower leg (including foot) Bathing: 5: Supervision: Safety issues/verbal cues  FIM - Upper Body Dressing/Undressing Upper body dressing/undressing steps patient completed: Thread/unthread right sleeve of pullover shirt/dresss;Thread/unthread left sleeve of pullover shirt/dress;Put head through opening of pull over shirt/dress;Pull shirt over trunk Upper body dressing/undressing: 5: Set-up assist to: Obtain clothing/put away FIM - Lower Body Dressing/Undressing Lower body dressing/undressing steps patient completed: Thread/unthread right underwear leg;Thread/unthread left underwear leg;Pull underwear up/down;Thread/unthread right pants leg;Thread/unthread left pants leg;Pull pants up/down;Fasten/unfasten pants;Don/Doff right sock;Don/Doff left sock;Don/Doff left shoe Lower body dressing/undressing: 4: Min-Patient completed 75 plus % of tasks  FIM - Toileting Toileting steps completed by patient: Adjust clothing prior to toileting;Performs perineal hygiene;Adjust clothing after toileting Toileting Assistive Devices: Grab bar or rail for support Toileting: 4: Steadying assist  FIM - Radio producer Devices: Grab bars Toilet Transfers: 4-From toilet/BSC: Min A (steadying Pt. > 75%);4-To toilet/BSC: Min A (steadying Pt. > 75%)  FIM - Bed/Chair  Transfer Bed/Chair Transfer Assistive Devices: Arm rests;Bed rails;HOB elevated Bed/Chair Transfer: 4: Chair or W/C > Bed: Min A (steadying Pt. > 75%);4: Bed > Chair or W/C: Min A (  steadying Pt. > 75%)  FIM - Locomotion: Wheelchair Distance: 150 Locomotion: Wheelchair: 6: Travels 150 ft or more, turns around, maneuvers to table, bed or toilet, negotiates 3% grade: maneuvers on rugs and over door sills independently FIM - Locomotion: Ambulation Locomotion: Ambulation Assistive Devices: Cane - Straight;Cane - Quad;Orthosis Ambulation/Gait Assistance: 3: Mod assist Locomotion: Ambulation: 1: Travels less than 50 ft with moderate assistance (Pt: 50 - 74%)  Comprehension Comprehension Mode: Auditory Comprehension: 6-Follows complex conversation/direction: With extra time/assistive device  Expression Expression Mode: Verbal Expression: 6-Expresses complex ideas: With extra time/assistive device  Social Interaction Social Interaction: 6-Interacts appropriately with others with medication or extra time (anti-anxiety, antidepressant).  Problem Solving Problem Solving: 6-Solves complex problems: With extra time  Memory Memory: 6-More than reasonable amt of time  Medical Problem List and Plan:  1. DVT Prophylaxis/Anticoagulation: Pharmaceutical: Lovenox  2. Pain Management: N/A  3. Mood: seems appropriate. Ego support by team 4. Neuropsych: This patient is capable of making decisions on his/her own behalf.  5.HTN: uncontrolled. Continue Imdur, Prinivil, norvasc and coreg.Slowly regulate bp with scheduled meds.Norvasc Dose increased 0n 8/30.  Coreg increased 9/1--further increase to 37.5 bid, HR permitting, prinivil at 20mg  BID , increased Imdur to 60mg May treat spikes with clonidine- no evidence  for renal artery stenosis.  6. DM type 2: monitor with bid checks. Resume metformin as studies without contrast and discontinue lantus. Fair control 7. Hypokalemia: low normal on admit labs.  8.  Dyslipidemia: continue Lipitor.  9. CAD: continue Lipitor, coreg, Imdur and Prinivil. Now on ASA and plavix due to recent stent/CVA 10. CVA: extension of previous stroke on MRI. Speech has improved but still has more weakness on R side  Continue asa and plavix per neuro      LOS (Days) 17 A FACE TO FACE EVALUATION WAS PERFORMED  Danaija Eskridge T 05/18/2012, 6:43 AM

## 2012-05-18 NOTE — Progress Notes (Signed)
Physical Therapy Note  Patient Details  Name: [name] MRN: [id number] Date of Birth: Aug 22, 1946 Today's Date: 05/18/2012  Time: 1345-1445 60 minutes  Pt participated in gait training exercise group with RW with supervision for controlled environment with cues for lifting R LE to prevent toe drag.  Pt required min A with obstacle negotiation and stepping over obstacles.  Stair training with min A.  Group therapy   Tramain Gershman 05/18/2012, 4:27 PM

## 2012-05-19 ENCOUNTER — Inpatient Hospital Stay (HOSPITAL_COMMUNITY): Payer: Medicaid Other | Admitting: Physical Therapy

## 2012-05-19 ENCOUNTER — Inpatient Hospital Stay (HOSPITAL_COMMUNITY): Payer: Medicaid Other | Admitting: Occupational Therapy

## 2012-05-19 DIAGNOSIS — G8110 Spastic hemiplegia affecting unspecified side: Secondary | ICD-10-CM

## 2012-05-19 DIAGNOSIS — I6330 Cerebral infarction due to thrombosis of unspecified cerebral artery: Secondary | ICD-10-CM

## 2012-05-19 DIAGNOSIS — E119 Type 2 diabetes mellitus without complications: Secondary | ICD-10-CM | POA: Diagnosis present

## 2012-05-19 DIAGNOSIS — I10 Essential (primary) hypertension: Secondary | ICD-10-CM

## 2012-05-19 DIAGNOSIS — Z5189 Encounter for other specified aftercare: Secondary | ICD-10-CM

## 2012-05-19 DIAGNOSIS — I255 Ischemic cardiomyopathy: Secondary | ICD-10-CM | POA: Diagnosis present

## 2012-05-19 DIAGNOSIS — I639 Cerebral infarction, unspecified: Secondary | ICD-10-CM | POA: Diagnosis present

## 2012-05-19 LAB — GLUCOSE, CAPILLARY: Glucose-Capillary: 89 mg/dL (ref 70–99)

## 2012-05-19 MED ORDER — SPIRONOLACTONE 25 MG PO TABS
25.0000 mg | ORAL_TABLET | Freq: Every day | ORAL | Status: DC
  Administered 2012-05-19 – 2012-05-21 (×3): 25 mg via ORAL
  Filled 2012-05-19 (×4): qty 1

## 2012-05-19 MED ORDER — ISOSORBIDE DINITRATE 20 MG PO TABS
40.0000 mg | ORAL_TABLET | Freq: Two times a day (BID) | ORAL | Status: DC
  Administered 2012-05-19 – 2012-05-21 (×4): 40 mg via ORAL
  Filled 2012-05-19 (×6): qty 2

## 2012-05-19 MED ORDER — AMLODIPINE BESYLATE 5 MG PO TABS
5.0000 mg | ORAL_TABLET | Freq: Two times a day (BID) | ORAL | Status: DC
  Administered 2012-05-19 – 2012-05-21 (×4): 5 mg via ORAL
  Filled 2012-05-19 (×6): qty 1

## 2012-05-19 NOTE — Progress Notes (Signed)
Occupational Therapy Session Note  Patient Details  Name: [name] MRN: [id number] Date of Birth: 01/30/1946  Today's Date: 05/19/2012 Time: 0918-1000 and [id number] Time Calculation (min): 42 min and 45 min  Short Term Goals: Week 3:  OT Short Term Goal 1 (Week 3): Pt will complete LB dressing with supervision OT Short Term Goal 2 (Week 3): Pt will complete toilet transfer with supervision with RW OT Short Term Goal 3 (Week 3): Pt will complet tub/shower transfer with supervision   Skilled Therapeutic Interventions/Progress Updates:   1) Pt seen for ADL retraining with focus on functional mobility with RW, toilet and walk-in shower transfers, and increased independence with self-care tasks of bathing and dressing.  Pt on toilet upon arrival, completed hygiene and doffing clothing in preparation for bathing at shower level.  Pt ambulated to walk-in shower with RW and use of grab bars for transfer with supervision/cues.  Pt completed bathing and dressing with overall distant supervision and assist with donning Rt shoe secondary to AFO.  Pt mod I with grooming at w/c level.  2) 1:1 OT with focus on tub/shower transfer with use of tub bench and RW.  Pt ambulated with RW and Rt hand splint into ADL tub room and transferred into tub/shower with supervision/cues for hand placement and appropriate technique.  Pt's wife present during session to observe.  Pt has tub bench from sister to use upon d/c.  Simulated simple meal prep with obtaining items from fridge and lower cabinets with close supervision.  Pt with 1 LOB with ambulation on carpet, requiring mod assist to regain balance.  Engaged in therapy putty exercises and provided pt with handouts to promote [facility] and pinch/grasp strength with yellow (soft resistance).  Encouraged pt to perform 3-4 exercises on sheet 2-3 times/day.  Therapy Documentation Precautions:  Precautions Precautions: Fall Precaution Comments: Monitor vitals: cease  activity if BP >180/>110 and alert RN/MD.  DM, CAD with stents Required Braces or Orthoses: Other Brace/Splint (R foot AFO-applied in shoe with heel lift ) Restrictions Weight Bearing Restrictions: No General:   Vital Signs: Therapy Vitals Pulse Rate: 93  BP: 125/81 mmHg Patient Position, if appropriate: Sitting Pain: Pain Assessment Pain Assessment: No/denies pain  See FIM for current functional status  Therapy/Group: Individual Therapy  Sim Boast 05/19/2012, 12:21 PM

## 2012-05-19 NOTE — Progress Notes (Signed)
I have read and agree with the following treatment session note.  Raylene Everts, PT, DPT

## 2012-05-19 NOTE — Progress Notes (Signed)
Patient ID: [name], male   DOB: May 28, 1946, 66 y.o.   MRN: [id number] Subjective/Complaints: Has seen [facility] in past  A 12 point review of systems has been performed and if not noted above is otherwise negative.  Review of Systems  Gastrointestinal: Positive for constipation.  All other systems reviewed and are negative.   Objective: Vital Signs: Blood pressure 176/105, pulse 95, temperature 97.8 F (36.6 C), temperature source Oral, resp. rate 18, height 5\' 8"  (1.727 m), weight 86 kg (189 lb 9.5 oz), SpO2 100.00%. No results found. No results found for this basename: WBC:2,HGB:2,HCT:2,PLT:2 in the last 72 hours No results found for this basename: NA:2,K:2,CL:2,CO2:2,GLUCOSE:2,BUN:2,CREATININE:2,CALCIUM:2 in the last 72 hours CBG (last 3)   Basename 05/19/12 0722 05/18/12 2151 05/18/12 1642  GLUCAP 157* 74 81    Wt Readings from Last 3 Encounters:  05/14/12 86 kg (189 lb 9.5 oz)  04/28/12 87.8 kg (193 lb 9 oz)  03/09/12 91.7 kg (202 lb 2.6 oz)    Physical Exam:  Nursing note and vitals reviewed.  Constitutional: He is oriented to person, place, and time. He appears well-developed and well-nourished.  HENT:  Head: Normocephalic and atraumatic.  Eyes: Pupils are equal, round, and reactive to light.  Neck: Normal range of motion. Neck supple.  Cardiovascular: Normal rate and regular rhythm. No murmurs or gallops  Pulmonary/Chest: Effort normal and breath sounds normal. No rales or wheezes, no distress  Abdominal: Soft. Bowel sounds are normal. Slightly distended Musculoskeletal: Normal range of motion. He exhibits no edema.  Neurological: He is alert and oriented to person, place, and time.  Able to identify simple objects. Follows all commnads. Speech is low volume but very intelligible. RUE is 2/5, RLE is 2/5 -3-/5. Sensation 1/2 on right. Right central 7 and tongue deviation  Skin: Skin is warm and dry.    Assessment/Plan: 1. Functional deficits secondary  to left internal capsule infarct (with small extension) with right hemiparesis which require 3+ hours per day of interdisciplinary therapy in a comprehensive inpatient rehab setting. Physiatrist is providing close team supervision and 24 hour management of active medical problems listed below. Physiatrist and rehab team continue to assess barriers to discharge/monitor patient progress toward functional and medical goals. Team conference   FIM: FIM - Bathing Bathing Steps Patient Completed: Chest;Right Arm;Left Arm;Abdomen;Front perineal area;Buttocks;Right upper leg;Left lower leg (including foot);Right lower leg (including foot);Left upper leg Bathing: 6: More than reasonable amount of time  FIM - Upper Body Dressing/Undressing Upper body dressing/undressing steps patient completed: Thread/unthread right sleeve of pullover shirt/dresss;Thread/unthread left sleeve of pullover shirt/dress;Put head through opening of pull over shirt/dress Upper body dressing/undressing: 5: Set-up assist to: Obtain clothing/put away FIM - Lower Body Dressing/Undressing Lower body dressing/undressing steps patient completed: Thread/unthread right underwear leg;Thread/unthread left underwear leg;Pull underwear up/down;Thread/unthread right pants leg;Thread/unthread left pants leg;Pull pants up/down;Fasten/unfasten pants;Don/Doff right sock;Don/Doff left sock;Don/Doff right shoe;Don/Doff left shoe Lower body dressing/undressing: 4: Min-Patient completed 75 plus % of tasks  FIM - Toileting Toileting steps completed by patient: Adjust clothing prior to toileting;Performs perineal hygiene;Adjust clothing after toileting Toileting Assistive Devices: Grab bar or rail for support Toileting: 4: Steadying assist  FIM - Radio producer Devices: Grab bars Toilet Transfers: 4-From toilet/BSC: Min A (steadying Pt. > 75%);4-To toilet/BSC: Min A (steadying Pt. > 75%)  FIM - Bed/Chair  Transfer Bed/Chair Transfer Assistive Devices: Arm rests;Bed rails;HOB elevated Bed/Chair Transfer: 4: Bed > Chair or W/C: Min A (steadying Pt. > 75%)  FIM -  Locomotion: Wheelchair Distance: 150 Locomotion: Wheelchair: 6: Travels 150 ft or more, turns around, maneuvers to table, bed or toilet, negotiates 3% grade: maneuvers on rugs and over door sills independently FIM - Locomotion: Ambulation Locomotion: Ambulation Assistive Devices: Cane - Straight;Cane - Quad;Orthosis Ambulation/Gait Assistance: 3: Mod assist Locomotion: Ambulation: 1: Travels less than 50 ft with moderate assistance (Pt: 50 - 74%)  Comprehension Comprehension Mode: Auditory Comprehension: 7-Follows complex conversation/direction: With no assist  Expression Expression Mode: Verbal Expression: 6-Expresses complex ideas: With extra time/assistive device  Social Interaction Social Interaction: 6-Interacts appropriately with others with medication or extra time (anti-anxiety, antidepressant).  Problem Solving Problem Solving: 7-Solves complex problems: Recognizes & self-corrects  Memory Memory: 7-Complete Independence: No helper  Medical Problem List and Plan:  1. DVT Prophylaxis/Anticoagulation: Pharmaceutical: Lovenox  2. Pain Management: N/A  3. Mood: seems appropriate. Ego support by team 4. Neuropsych: This patient is capable of making decisions on his/her own behalf.  5.HTN: uncontrolled. Continue Imdur, Prinivil, norvasc and coreg.Slowly regulate bp with scheduled meds.Norvasc Dose increased 0n 8/30.  Coreg increased 9/1, Increase prinivil to 20mg  BID ,Imdur to 60mg  as of 9/6 May treat spikes with clonidine- no evidence  for renal artery stenosis 6. DM type 2: Controlledmonitor with bid checks. Resume metformin as studies without contrast and discontinue lantus. Fair control 7. Hypokalemia: low normal on admit labs.  8. Dyslipidemia: continue Lipitor.  9. CAD: continue Lipitor, coreg, Imdur and Prinivil.  Now on ASA and plavix due to recent stent/CVA 10. CVA: extension of previous stroke on MRI. Speech has improved but still has more weakness on R side  Continue asa and plavix per neuro      LOS (Days) 18 A FACE TO FACE EVALUATION WAS PERFORMED  Tomaz Janis E 05/19/2012, 8:09 AM

## 2012-05-19 NOTE — Consult Note (Addendum)
Reason for Consult: HTN  Requesting Physician: Dr Letta Pate  HPI: This is a 66 y.o. male with a past medical history significant for CAD. He had CABG X 4 in 2009 with L-LAD, SVG-Dx, SVG-OM, SVG-RCA. He lost his insurance and was basically lost to follow up until 03/07/12 when he presented with NSTEMI with uncontrolled HTN and DM. He had not taken any medications for 2 yrs before he presented 03/07/12. He received a BMS to the SVG-OM and a staged intervention with a DES to the SVG-RCA and a PDA PCI. His EF was 30% and he was discharge with a Life Vest 03/11/12. He saw Tarri Fuller PA in the office 03/27/12. He has been compliant with medications. He was admitted again 04/28/12 with a new Lt brain CVA.  His Effient was stopped and changed to Plavix because of his CVA. An echo done 8/ 19/13 showed an EF of 30-35%. On 05/02/12 he had an apparent extension of his stroke while in [place]. Neurology recommends avoiding overly aggressive control of his B/P but the pt's B/P has been higher than is acceptable. This am his B/P was 176/105 before meds, most recent B/P is 125/81. He denies any chest pain or SOB. He does have significant Rt sided weakness.  PMHx:  Past Medical History  Diagnosis Date  . Coronary artery disease   . Diabetes mellitus   . Arthritis   . Hypertension   . Hypertensive crisis 03/07/2012  . Unstable angina 03/07/2012  . DM (diabetes mellitus),poorly controlled 03/07/2012  . CAD (coronary artery disease), with CABG in 2009 after an MI 03/07/2012  . Myocardial infarction    Past Surgical History  Procedure Date  . Cardiac surgery   . Coronary artery bypass graft   . Cardiac catheterization     FAMHx: No family history on file.  SOCHx:  reports that he quit smoking about 30 years ago. He has never used smokeless tobacco. He reports that he drinks about .6 ounces of alcohol per week. He reports that he does not use illicit drugs.  ALLERGIES: No Known Allergies  ROS: Pertinent items  are noted in HPI.  HOME MEDICATIONS: Prescriptions prior to admission  Medication Sig Dispense Refill  . acetaminophen (TYLENOL) 325 MG tablet Take 650 mg by mouth every 4 (four) hours as needed. For pain/headache      . aspirin EC 81 MG tablet Take 81 mg by mouth daily.      . bd getting started take home kit MISC 1 kit by Other route once.      . carvedilol (COREG) 12.5 MG tablet Take 12.5 mg by mouth 2 (two) times daily with a meal.      . furosemide (LASIX) 20 MG tablet Take 20 mg by mouth daily.      . isosorbide mononitrate (IMDUR) 30 MG 24 hr tablet Take 30 mg by mouth daily.      Marland Kitchen lisinopril (PRINIVIL,ZESTRIL) 20 MG tablet Take 20 mg by mouth daily.      . metFORMIN (GLUCOPHAGE) 500 MG tablet Take 500 mg by mouth 2 (two) times daily with a meal.      . nitroGLYCERIN (NITROSTAT) 0.4 MG SL tablet Place 0.4 mg under the tongue every 5 (five) minutes as needed. For chest pain      . simvastatin (ZOCOR) 80 MG tablet Take 40 mg by mouth every evening.        HOSPITAL MEDICATIONS: I have reviewed the patient's current medications.  VITALS: Blood pressure 125/81,  pulse 93, temperature 97.8 F (36.6 C), temperature source Oral, resp. rate 18, height 5\' 8"  (1.727 m), weight 86 kg (189 lb 9.5 oz), SpO2 100.00%.  PHYSICAL EXAM: General appearance: alert, cooperative and no distress Neck: no carotid bruit and no JVD Lungs: clear to auscultation bilaterally Heart: regular rate and rhythm Abdomen: soft, non-tender; bowel sounds normal; no masses,  no organomegaly Extremities: no edema Pulses: 2+ and symmetric Skin: Skin color, texture, turgor normal. No rashes or lesions Neurologic: Rt hemiparesis  LABS: Results for orders placed during the hospital encounter of 05/01/12 (from the past 48 hour(s))  GLUCOSE, CAPILLARY     Status: Normal   Collection Time   05/17/12  4:26 PM      Component Value Range Comment   Glucose-Capillary 95  70 - 99 mg/dL    Comment 1 Notify RN     GLUCOSE,  CAPILLARY     Status: Normal   Collection Time   05/17/12  9:46 PM      Component Value Range Comment   Glucose-Capillary 95  70 - 99 mg/dL    Comment 1 Notify RN     GLUCOSE, CAPILLARY     Status: Abnormal   Collection Time   05/18/12  7:35 AM      Component Value Range Comment   Glucose-Capillary 170 (*) 70 - 99 mg/dL    Comment 1 Notify RN     GLUCOSE, CAPILLARY     Status: Normal   Collection Time   05/18/12 11:33 AM      Component Value Range Comment   Glucose-Capillary 83  70 - 99 mg/dL    Comment 1 Notify RN     GLUCOSE, CAPILLARY     Status: Normal   Collection Time   05/18/12  4:42 PM      Component Value Range Comment   Glucose-Capillary 81  70 - 99 mg/dL    Comment 1 Notify RN     GLUCOSE, CAPILLARY     Status: Normal   Collection Time   05/18/12  9:51 PM      Component Value Range Comment   Glucose-Capillary 74  70 - 99 mg/dL    Comment 1 Documented in Chart      Comment 2 Notify RN     GLUCOSE, CAPILLARY     Status: Abnormal   Collection Time   05/19/12  7:22 AM      Component Value Range Comment   Glucose-Capillary 157 (*) 70 - 99 mg/dL    Comment 1 Notify RN     GLUCOSE, CAPILLARY     Status: Abnormal   Collection Time   05/19/12 11:33 AM      Component Value Range Comment   Glucose-Capillary 115 (*) 70 - 99 mg/dL    Comment 1 Notify RN       IMAGING: No results found.  IMPRESSION: Principal Problem:  *Acute ischemic stroke. 04/28/12 Active Problems:  HTN (hypertension), poor control  CVA exstension 05/02/12 by CT  DM (diabetes mellitus),poorly controlled  CAD (coronary artery disease), with CABG in 2009 after an MI  NSTEMI -03/07/12 SVG-OM BMS with staged SVG-RCA DES and PDA PCI 03/10/12  Ischemic cardiomyopathy, EF 30-35% 2D 04/28/12  Dyslipidemia, (HDL 25)   RECOMMENDATION: MD to see, consider adding Hytrin QHS. His Life Vest was sent back a couple of days ago as the family felt he would not be ablty to use it after his stroke.  Time Spent Directly with  Patient:  45 minutes  KILROY,LUKE K 05/19/2012, 12:22 PM   I have seen and examined the patient along with Kerin Ransom, PA.  I have reviewed the chart, notes and new data.  I agree with PA's note.  He appears to have well compensated CHF and no active acute coronary insufficiency. He has moderate to severely reduced LVEF and BP reduction should preferably be achieved with ACEi and beta blocker therapy and alpha blockers should be avoided.. There seems to be a pattern of early morning hypertension and better BP control later in the day. Note K 3.6 and normal renal function.  PLAN: Dose amlodipine and nitrates twice daily. Add aldactone. Will follow and adjust BP meds. Repeat limited echo with microbubble contrast to make sure stroke is not embolic from LV thrombus. We will also reassess LVEF almost 6 weeks post infarction. If EF still <35% consider AICD (although may need to wait another 6 weeks until implantation).  Sanda Klein, MD, [place] [phone] 05/19/2012, 5:30 PM

## 2012-05-19 NOTE — Progress Notes (Signed)
Physical Therapy Session Note  Patient Details  Name: [name] MRN: [id number] Date of Birth: 04-23-1946  Today's Date: 05/19/2012 Time: 1100-1200; 1600 -1645 Time Calculation (min): 60 min; 45 min    Skilled Therapeutic Interventions/Progress Updates:    Patient reports that he felt "so-so" today. When asked, patient reports that he believes it is because his BP medication as been increased.   Therapy Documentation Precautions:  Precautions Precautions: Fall Precaution Comments: Monitor vitals: cease activity if BP >180/>110 and alert RN/MD.  DM, CAD with stents Required Braces or Orthoses: Other Brace/Splint (R foot AFO-applied in shoe with heel lift ) Restrictions Weight Bearing Restrictions: No   Vital Signs:  BP: 125/86 HR: 93 bpm  Pain:  None reported   Locomotion :    Patient ambulated 100 feet with RW and hand orthosis for RUE with min assist. Patient presented with decreased ability to clear RLE and R trunk lean when fatigued today. Patient requires verbal cues for posture, form, and encouraging foot clearance.  Patient demonstrated the ability to ambulate over compliant surfaces (simulating carpet) and in and out of obstacles with RW and hand orthosis for RUE x 50 feet with min assist for steadying patient. Patient continued to require verbal cues throughout walk for posture, form, decrease R lateral lean, and encourage foot clearance.   Second session: PT took patient outside to practice ambulating over uneven terrain, up/down curb and ramp, and 6 steps. Patient ambulated >150 feet over cobble stone and sidewalks with RW and hand orthosis for RUE with min guard. Patient continues to intermittently scuff foot of RLE when ambulating. Patient demonstrated ability to ascend/descend curb with RW and min assist x 2 reps. Patient required verbal cues for appropriate LE to lead with when performing step. Patient ascended/descended ramp with little to no difficulty.  Patient had increased foot clearance when ascending vs. Descending ramp. Patient ascended/descended 6 steps with L handrail and min guard. Patient demonstrated correct lead LE. Patient was educated on the importance of performing step-to gait pattern (not reciprocal) when descending steps secondary to AFO.       Exercises: Patient performed lateral step up/down with RLE to assist with strengthening of R hip musculature, motor control when descending, and elongating trunk. Attempted to complete on 4 inch step, however patient was unable to control descent resulting in R knee buckling. Changed to 2 inch step which patient was successful and able to complete exercise with proper form until fatigued.  Patient performed sideward stepping in parallel bars with resistance at R foot to strengthen R hip abductors and aid with stabilization during gait. Patient performed exercise 1 feet x 2 reps.  NMR: Patient performed lateral weight shifts with RUE abducted to 90 deg to assist with elongating trunk/decreasing R lateral lean during gait. Patient performed stepping forward and back with bilateral LEs with UE abducted and was progressed with walking ~8 feet. When ambulating patient presented with decreased lateral lean, however had increased difficulty stabilizing R hip at stance phase on RLE.  Second Session: Patient performed bed mobility in ADL apartment. Patient was able to ambulate 10 feet over carpet to side of bed and perform sit <> supine transfer with supervision. Patient also demonstrated lateral stepping with RW as a possible option when at home in tight spaces. Patient was able to perform safely with little difficulty.    See FIM for current functional status  Therapy/Group: Individual Therapy  Nolia Tschantz [place] DPT Student  05/19/2012, 5:10 PM

## 2012-05-20 ENCOUNTER — Inpatient Hospital Stay (HOSPITAL_COMMUNITY): Payer: Medicaid Other | Admitting: Occupational Therapy

## 2012-05-20 ENCOUNTER — Inpatient Hospital Stay (HOSPITAL_COMMUNITY): Payer: Medicaid Other | Admitting: Physical Therapy

## 2012-05-20 DIAGNOSIS — I6330 Cerebral infarction due to thrombosis of unspecified cerebral artery: Secondary | ICD-10-CM

## 2012-05-20 DIAGNOSIS — Z5189 Encounter for other specified aftercare: Secondary | ICD-10-CM

## 2012-05-20 DIAGNOSIS — I10 Essential (primary) hypertension: Secondary | ICD-10-CM

## 2012-05-20 DIAGNOSIS — G8110 Spastic hemiplegia affecting unspecified side: Secondary | ICD-10-CM

## 2012-05-20 HISTORY — PX: OTHER SURGICAL HISTORY: SHX169

## 2012-05-20 LAB — GLUCOSE, CAPILLARY
Glucose-Capillary: 134 mg/dL — ABNORMAL HIGH (ref 70–99)
Glucose-Capillary: 89 mg/dL (ref 70–99)
Glucose-Capillary: 85 mg/dL (ref 70–99)

## 2012-05-20 MED ORDER — PERFLUTREN LIPID MICROSPHERE
1.0000 mL | Freq: Once | INTRAVENOUS | Status: AC
  Administered 2012-05-20: 3 mL via INTRAVENOUS
  Filled 2012-05-20: qty 10

## 2012-05-20 NOTE — Progress Notes (Signed)
Occupational Therapy Discharge Summary  Patient Details  Name: [name] MRN: [id number] Date of Birth: 1946-02-20  Today's Date: 05/20/2012  Patient has met 8 of 10 long term goals due to improved activity tolerance, improved balance, postural control, ability to compensate for deficits and improved awareness.  Patient to discharge at overall Supervision level.  Patient's care partner is independent to provide the necessary physical and cognitive assistance at discharge.  Patient continues to require supervision with ambulation with RW and transfers with RW secondary to occasional Rt foot drop and requires supervision for occasional cues and in case of LOB.    Reasons goals not met: Patient continues to require supervision with ambulation with RW and transfers with RW secondary to occasional Rt foot drop and requires supervision for occasional cues and in case of LOB and therefore did not quite achieve modified independent with toilet transfers.  Pt also has increased tone in Rt shoulder and less AROM upon d/c than on initial eval and was unable to achieve nondominant status of RUE, however has shown improvements in incorporating RUE into self-care tasks with min cues.  Recommendation:  Patient will benefit from ongoing skilled OT services in outpatient setting to continue to advance functional skills in the area of BADL, iADL and Reduce care partner burden.  Equipment: Pt received necessary equipment from family member.  Reasons for discharge: treatment goals met and discharge from hospital  Patient/family agrees with progress made and goals achieved: Yes  OT Discharge Precautions/Restrictions  Precautions Precautions: Fall Precaution Comments: Monitor vitals: cease activity if BP >180/>110 and alert RN/MD. DM, CAD with stents  Required Braces or Orthoses: Other Brace/Splint Other Brace/Splint: Rt foot AFO-applied in shoe with heel lift  ADL ADL Equipment Provided: Other  (comment) (shoe button) Grooming: Modified independent Where Assessed-Grooming: Sitting at sink Upper Body Bathing: Supervision/safety Where Assessed-Upper Body Bathing: Shower Lower Body Bathing: Supervision/safety Where Assessed-Lower Body Bathing: Shower Upper Body Dressing: Independent Where Assessed-Upper Body Dressing: Sitting at sink Lower Body Dressing: Supervision/safety;Setup Where Assessed-Lower Body Dressing: Sitting at sink;Standing at sink Toileting: Supervision/safety Where Assessed-Toileting: Glass blower/designer: Close supervision Toilet Transfer Method: Ambulating (with RW) Science writer: Energy manager: Close supervison Clinical cytogeneticist Method: Librarian, academic: Facilities manager: Close supervision Social research officer, government Method: Ambulating (with RW) Advertising account executive with back ADL Comments: Pt distant supervision with self-care tasks except close supervision with transfers and setup to don Rt shoe with AFO Vision/Perception  Vision - History Baseline Vision: Wears glasses only for reading Patient Visual Report: No change from baseline  Cognition Overall Cognitive Status: Appears within functional limits for tasks assessed Arousal/Alertness: Awake/alert Orientation Level: Oriented X4 Memory: Appears intact Awareness: Appears intact Problem Solving: Appears intact Safety/Judgment: Appears intact Sensation Sensation Light Touch: Appears Intact Hot/Cold: Appears Intact Proprioception: Appears Intact Coordination Gross Motor Movements are Fluid and Coordinated: No Fine Motor Movements are Fluid and Coordinated: No Coordination and Movement Description: Decreased functional use of RUE and poor RLE coordination during gait Extremity/Trunk Assessment RUE Assessment RUE Assessment: Exceptions to [facility] RUE AROM (degrees) RUE Overall AROM Comments: shoulder flexion  approx 40 degrees before compensatory movements RUE Strength Right Shoulder Flexion: 2-/5 LUE Assessment LUE Assessment: Within Functional Limits  See FIM for current functional status  Sim Boast 05/20/2012, 3:39 PM

## 2012-05-20 NOTE — Progress Notes (Signed)
Social Work Patient ID: [name], male   DOB: 1946/02/05, 66 y.o.   MRN: [id number] Met with pt and wife to confirm discharge tomorrow.  Pt pleased with his progress while here.  Wife comfortable with his care. Referral made to OP Rehab for follow up therapies.  Borrowed DME and received tub bench.  Set for discharge tomorrow.  To follow up At [facility] followed PTA for medical issues.  Medicaid still pending.

## 2012-05-20 NOTE — Progress Notes (Signed)
  Echocardiogram 2D Echocardiogram has been performed.  Mauricio Po 05/20/2012, 4:49 PM

## 2012-05-20 NOTE — Progress Notes (Signed)
Physical Therapy Discharge Summary  Patient Details  Name: [name] MRN: [id number] Date of Birth: 11-04-1945  Today's Date: 05/20/2012 Time: 1100-1200; [id number] Time Calculation (min): 60 min; 40 min  Patient has met 8 of 10 long term goals due to improved activity tolerance, improved balance, improved postural control, increased strength, increased range of motion, ability to compensate for deficits, functional use of  right upper extremity and right lower extremity and improved coordination.  Patient to discharge at an ambulatory level Supervision with RW and hand orthosis for RUE with intermittent use of w/c for community distances. Patient's care partner is independent to provide the necessary physical and supervision assistance at discharge.  Reasons goals not met: Patient required min to mod assistance for floor > furniture transfer secondary to continued weakness. Patient required intermittent min to mod assist during community ambulation.   Recommendation:  Patient will benefit from ongoing skilled PT services in outpatient setting to continue to advance safe functional mobility, address ongoing impairments in continued R sided strength impairments, decreased flexibility, decreased endurance, increased tone, mild ataxia, higher level gait impairments, and balance deficits resulting in increase risk of falls per Merrilee Jansky.  Equipment: hand orthosis and [place]ard AFO  Reasons for discharge: treatment goals met and discharge from hospital  Patient/family agrees with progress made and goals achieved: Yes  PT Discharge Precautions/Restrictions Precautions Precautions: Fall Precaution Comments: Monitor vitals: cease activity if BP >180/>110 and alert RN/MD. DM, CAD with stents  Required Braces or Orthoses: Other Brace/Splint Other Brace/Splint: Rt foot AFO-applied in shoe with heel lift   Vital Signs Therapy Vitals Pulse Rate: 89  BP: 105/73 mmHg Patient Position, if  appropriate: Sitting  Pain Pain Assessment Pain Assessment: No/denies pain  Vision/Perception  Vision - History Baseline Vision: Wears glasses only for reading Patient Visual Report: No change from baseline   Cognition Overall Cognitive Status: Appears within functional limits for tasks assessed Arousal/Alertness: Awake/alert Orientation Level: Oriented X4 Memory: Appears intact Awareness: Appears intact Problem Solving: Appears intact Safety/Judgment: Appears intact  Sensation Sensation Light Touch: Appears Intact Stereognosis: Not tested Hot/Cold: Appears Intact Proprioception: Appears Intact Coordination Gross Motor Movements are Fluid and Coordinated: No Fine Motor Movements are Fluid and Coordinated: No Coordination and Movement Description: Decreased functional use of RUE and poor RLE coordination during gait Heel Shin Test: Mildly uncoordinated at slow and faster speeds; no evidence of dysmetria  Locomotion  Ambulation Ambulation/Gait Assistance: 5: Supervision  Patient ambulated ~75 feet in a controlled environment with RW and R hand orthosis with supervision. Patient did not require verbal cues to complete this task.  Patient demonstrated the ability to ambulate in a simulated community environment ~75 feet with RW and hand orthosis by stepping over obstacles, walking on compliant surfaces, and ambulating in and out of obstacles. Patient required intermittent verbal cues and min assist for safety to be successful.  Patient was also instructed to complete higher level walk tasks (i.e. Increasing/decreasing speed and head turns) however patient presented with increased difficulty with such tasks. Patient was unable to ambulate quickly maintaining control and was unable to perform head turns without slowing down and at times coming to a complete stop.  Patient ascended/descended 1 flight of stairs (12 steps) with L handrail and min assist. Patient required short sitting  rest after ascending/prior to descending.  Second session: Patient ambulated ~100 feet with RW and hand orthosis for RUE. Patient continues to present with intermittent decreased foot clearance on R which increases frequency with fatigue. Patient demonstrated  the ability to ascend/descend curb with RW and R hand orthosis with wife assisting when necessary. Patient was educated on importance of stepping as close to curb as possible, prior to performing curb to ensure safety. Wife and patient verbalized and demonstrated understanding.  Mobility: Second session: Patient performed car transfer using RW and supervision to min assist. Patient required some verbal cues for sequencing and safety. Patient practiced completing task with wife assisting. Patient and wife demonstrated and verbalized understanding.  Patient demonstrated floor > furniture transfer with min to mod assistance from therapist. Patient required verbal and visual cues for sequencing and safest way to perform transfer. Patient and wife verbalized understanding.    Balance Standardized Balance Assessment Standardized Balance Assessment: Berg Balance Test Berg Balance Test Sit to Stand: Able to stand  independently using hands Standing Unsupported: Able to stand safely 2 minutes Sitting with Back Unsupported but Feet Supported on Floor or Stool: Able to sit safely and securely 2 minutes Stand to Sit: Controls descent by using hands Transfers: Able to transfer safely, definite need of hands Standing Unsupported with Eyes Closed: Able to stand 10 seconds safely Standing Ubsupported with Feet Together: Able to place feet together independently and stand 1 minute safely From Standing, Reach Forward with Outstretched Arm: Can reach forward >12 cm safely (5") From Standing Position, Pick up Object from Floor: Able to pick up shoe, needs supervision From Standing Position, Turn to Look Behind Over each Shoulder: Looks behind one side  only/other side shows less weight shift Turn 360 Degrees: Needs close supervision or verbal cueing Standing Unsupported, Alternately Place Feet on Step/Stool: Able to complete >2 steps/needs minimal assist Standing Unsupported, One Foot in Front: Able to place foot tandem independently and hold 30 seconds Standing on One Leg: Able to lift leg independently and hold equal to or more than 3 seconds Total Score: 42  Extremity Assessment  RUE Assessment RUE Assessment: Exceptions to [facility] RUE AROM (degrees) RUE Overall AROM Comments: shoulder flexion approx 40 degrees before compensatory movements RUE Strength Right Shoulder Flexion: 2-/5 LUE Assessment LUE Assessment: Within Functional Limits RLE Strength RLE Overall Strength: Deficits RLE Overall Strength Comments: Increased tone R hamstring; strength 4/5 hip flexion, 3/5 knee extension, 2-3/5 ankle DF with brace present, 3/5 knee flexion LLE Assessment LLE Assessment: Within Functional Limits  All questions and concerns from both patient and wife were addressed prior to leaving patient in w/c in room.   See FIM for current functional status  Oral Hallgren [place] DPT Student 05/20/2012, 12:23 PM

## 2012-05-20 NOTE — Progress Notes (Signed)
Occupational Therapy Session Note  Patient Details  Name: [name] MRN: [id number] Date of Birth: 06/18/1946  Today's Date: 05/20/2012 Time: 0917-1000 and [id number] Time Calculation (min): 43 min and 32 min  Short Term Goals: Week 3:  OT Short Term Goal 1 (Week 3): Pt will complete LB dressing with supervision OT Short Term Goal 2 (Week 3): Pt will complete toilet transfer with supervision with RW OT Short Term Goal 3 (Week 3): Pt will complet tub/shower transfer with supervision   Skilled Therapeutic Interventions/Progress Updates:    1) Pt completed ADL retraining at overall supervision level.  Pt on toilet upon arrival and completed toilet transfer and hygiene with supervision.  Pt's wife present for session with no questions regarding assist level, reports that she will provide assistance as needed and was able to provide appropriate cues to pt to attempt to integrate RUE into dressing tasks.  Pt donned Rt shoe with increased time and setup with AFO.  2) 1:1 OT with focus on hands on education with pt's wife regarding AROM and A/AROM exercises for shoulder flexion/extension, elbow flexion/extension, internal/external rotation, and supination/pronation.  Pt completed 1 set of 10 each exercise with this therapist and pt's wife demonstrated hand placement for each exercise.  Handouts given to wife to use upon d/c for additional movement and stretching.  Pt missed 13 mins of skilled therapy secondary to RN care/starting IV.  Therapy Documentation Precautions:  Precautions Precautions: Fall Precaution Comments: Monitor vitals: cease activity if BP >180/>110 and alert RN/MD. DM, CAD with stents  Required Braces or Orthoses: Other Brace/Splint Other Brace/Splint: Rt foot AFO-applied in shoe with heel lift  Restrictions Weight Bearing Restrictions: No General:  Pt missed 13 mins of skilled therapy secondary to RN care/starting IV. Vital Signs: Therapy Vitals Pulse Rate: 89  BP:  105/73 mmHg Patient Position, if appropriate: Sitting Pain: Pain Assessment Pain Assessment: No/denies pain ADL: ADL Equipment Provided: Other (comment) (shoe button) Grooming: Modified independent Where Assessed-Grooming: Sitting at sink Upper Body Bathing: Supervision/safety Where Assessed-Upper Body Bathing: Shower Lower Body Bathing: Supervision/safety Where Assessed-Lower Body Bathing: Shower Upper Body Dressing: Independent Where Assessed-Upper Body Dressing: Sitting at sink Lower Body Dressing: Supervision/safety;Setup Where Assessed-Lower Body Dressing: Sitting at sink;Standing at sink Toileting: Supervision/safety Where Assessed-Toileting: Glass blower/designer: Close supervision Toilet Transfer Method: Ambulating (with RW) Science writer: Ambulance person Transfer: Close supervison Clinical cytogeneticist Method: Librarian, academic: Facilities manager: Close supervision Social research officer, government Method: Ambulating (with RW) Advertising account executive with back ADL Comments: Pt distant supervision with self-care tasks except close supervision with transfers and setup to don Rt shoe with AFO Exercises:   Other Treatments:    See FIM for current functional status  Therapy/Group: Individual Therapy  Sim Boast 05/20/2012, 12:08 PM

## 2012-05-20 NOTE — Progress Notes (Signed)
Patient ID: [name], male   DOB: [birth year], 66 y.o.   MRN: [id number] Subjective/Complaints: [place] cardiology consult. A 12 point review of systems has been performed and if not noted above is otherwise negative.  Review of Systems  Gastrointestinal: Positive for constipation.  All other systems reviewed and are negative.   Objective: Vital Signs: Blood pressure 166/110, pulse 99, temperature 98.1 F (36.7 C), temperature source Oral, resp. rate 19, height 5\' 8"  (1.727 m), weight 86 kg (189 lb 9.5 oz), SpO2 100.00%. No results found. No results found for this basename: WBC:2,HGB:2,HCT:2,PLT:2 in the last 72 hours No results found for this basename: NA:2,K:2,CL:2,CO2:2,GLUCOSE:2,BUN:2,CREATININE:2,CALCIUM:2 in the last 72 hours CBG (last 3)   Basename 05/19/12 2206 05/19/12 1647 05/19/12 1133  GLUCAP 80 89 115*    Wt Readings from Last 3 Encounters:  05/14/12 86 kg (189 lb 9.5 oz)  04/28/12 87.8 kg (193 lb 9 oz)  03/09/12 91.7 kg (202 lb 2.6 oz)    Physical Exam:  Nursing note and vitals reviewed.  Constitutional: He is oriented to person, place, and time. He appears well-developed and well-nourished.  HENT:  Head: Normocephalic and atraumatic.  Eyes: Pupils are equal, round, and reactive to light.  Neck: Normal range of motion. Neck supple.  Cardiovascular: Normal rate and regular rhythm. No murmurs or gallops  Pulmonary/Chest: Effort normal and breath sounds normal. No rales or wheezes, no distress  Abdominal: Soft. Bowel sounds are normal. Slightly distended Musculoskeletal: Normal range of motion. He exhibits no edema.  Neurological: He is alert and oriented to person, place, and time.  Able to identify simple objects. Follows all commnads. Speech is low volume but very intelligible. RUE is 2/5, RLE is 2/5 -3-/5. Sensation 1/2 on right. Right central 7 and tongue deviation  Skin: Skin is warm and dry.    Assessment/Plan: 1. 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 Physiatrist is providing close team supervision and 24 hour management of active medical problems listed below. Physiatrist and rehab team continue to assess barriers to discharge/monitor patient progress toward functional and medical goals. Team conference   FIM: FIM - Bathing Bathing Steps Patient Completed: Chest;Right Arm;Left Arm;Abdomen;Front perineal area;Buttocks;Right upper leg;Left upper leg;Right lower leg (including foot);Left lower leg (including foot) Bathing: 5: Supervision: Safety issues/verbal cues  FIM - Upper Body Dressing/Undressing Upper body dressing/undressing steps patient completed: Thread/unthread right sleeve of pullover shirt/dresss;Thread/unthread left sleeve of pullover shirt/dress;Put head through opening of pull over shirt/dress;Pull shirt over trunk Upper body dressing/undressing: 5: Supervision: Safety issues/verbal cues FIM - Lower Body Dressing/Undressing Lower body dressing/undressing steps patient completed: Thread/unthread right underwear leg;Thread/unthread left underwear leg;Pull underwear up/down;Thread/unthread right pants leg;Thread/unthread left pants leg;Pull pants up/down;Don/Doff right sock;Don/Doff left sock;Don/Doff left shoe;Fasten/unfasten right shoe;Fasten/unfasten left shoe Lower body dressing/undressing: 4: Min-Patient completed 75 plus % of tasks  FIM - Toileting Toileting steps completed by patient: Adjust clothing prior to toileting;Performs perineal hygiene;Adjust clothing after toileting Toileting Assistive Devices: Grab bar or rail for support Toileting: 5: Supervision: Safety issues/verbal cues  FIM - Radio producer Devices: Grab bars Toilet Transfers: 4-From toilet/BSC: Min A (steadying Pt. > 75%);4-To  toilet/BSC: Min A (steadying Pt. > 75%)  FIM - Bed/Chair Transfer Bed/Chair Transfer Assistive Devices: Arm rests;Bed rails;HOB elevated Bed/Chair Transfer: 5: Supine > Sit: Supervision (verbal cues/safety issues);5: Sit > Supine: Supervision (verbal cues/safety issues);4: Bed >  Chair or W/C: Min A (steadying Pt. > 75%);4: Chair or W/C > Bed: Min A (steadying Pt. > 75%)  FIM - Locomotion: Wheelchair Distance: 150 Locomotion: Wheelchair: 6: Travels 150 ft or more, turns around, maneuvers to table, bed or toilet, negotiates 3% grade: maneuvers on rugs and over door sills independently FIM - Locomotion: Ambulation Locomotion: Ambulation Assistive Devices: Walker - Rolling;Orthosis Ambulation/Gait Assistance: 4: Min assist Locomotion: Ambulation: 4: Travels 150 ft or more with minimal assistance (Pt.>75%)  Comprehension Comprehension Mode: Auditory Comprehension: 6-Follows complex conversation/direction: With extra time/assistive device  Expression Expression Mode: Verbal Expression: 6-Expresses complex ideas: With extra time/assistive device  Social Interaction Social Interaction: 6-Interacts appropriately with others with medication or extra time (anti-anxiety, antidepressant).  Problem Solving Problem Solving: 6-Solves complex problems: With extra time  Memory Memory: 6-Assistive device: No helper  Medical Problem List and Plan:  1. DVT Prophylaxis/Anticoagulation: Pharmaceutical: Lovenox  2. Pain Management: N/A  3. Mood: seems appropriate. Ego support by team 4. Neuropsych: This patient is capable of making decisions on his/her own behalf.  5.HTN: uncontrolled. Continue Imdur, Prinivil, norvasc and coreg.Slowly regulate bp with scheduled meds.Norvasc, Coreg, prinivil ,Imdur no evidence  for renal artery stenosis 6. DM type 2: Controlledmonitor with bid checks. Resume metformin as studies without contrast and discontinue lantus. Fair control 7. Hypokalemia: low normal on admit  labs.  8. Dyslipidemia: continue Lipitor.  9. CAD: continue Lipitor, coreg, Imdur and Prinivil. Now on ASA and plavix due to recent stent/CVA Repeat ECHO per cardiology 10. CVA: extension of previous stroke on MRI. Speech has improved but still has more weakness on R side  Continue asa and plavix per neuro      LOS (Days) 19 A FACE TO FACE EVALUATION WAS PERFORMED  Zoey Bidwell E 05/20/2012, 7:33 AM

## 2012-05-20 NOTE — Progress Notes (Signed)
Pt. Seen and examined. Agree with the NP/PA-C note as written.  Will review 2D echo with definity contrast to r/o thrombus.  Pixie Casino, MD, [facility] Attending Cardiologist [facility]

## 2012-05-20 NOTE — Progress Notes (Signed)
Subjective:  No SOB  Objective:  Vital Signs in the last 24 hours: Temp:  [97.9 F (36.6 C)-98.1 F (36.7 C)] 98.1 F (36.7 C) (09/10 0514) Pulse Rate:  [93-107] 99  (09/10 0525) Resp:  [19-23] 19  (09/10 0525) BP: (124-166)/(80-117) 138/98 mmHg (09/10 0755) SpO2:  [98 %-100 %] 100 % (09/10 0525)  Intake/Output from previous day:  Intake/Output Summary (Last 24 hours) at 05/20/12 0929 Last data filed at 05/20/12 0527  Gross per 24 hour  Intake    960 ml  Output   1100 ml  Net   -140 ml    Physical Exam: General appearance: alert, cooperative and no distress Lungs: clear to auscultation bilaterally Heart: regular rate and rhythm   Rate: 88  Rhythm: indeterminate, NSR on exam and by EKG, pt is not on telemetry  Lab Results: No results found for this basename: WBC:2,HGB:2,PLT:2 in the last 72 hours No results found for this basename: NA:2,K:2,CL:2,CO2:2,GLUCOSE:2,BUN:2,CREATININE:2 in the last 72 hours No results found for this basename: TROPONINI:2,CK,MB:2 in the last 72 hours Hepatic Function Panel No results found for this basename: PROT,ALBUMIN,AST,ALT,ALKPHOS,BILITOT,BILIDIR,IBILI in the last 72 hours No results found for this basename: CHOL in the last 72 hours No results found for this basename: INR in the last 72 hours  Imaging: Imaging results have been reviewed  Cardiac Studies:  Assessment/Plan:   Principal Problem:  *Acute ischemic stroke. 04/28/12 Active Problems:  HTN (hypertension), poor control, (no RAS by doppler)  CVA exstension 05/02/12 by CT  DM (diabetes mellitus),poorly controlled  CAD (coronary artery disease), with CABG in 2009 after an MI  NSTEMI -03/07/12 SVG-OM BMS with staged SVG-RCA DES and PDA PCI 03/10/12  Ischemic cardiomyopathy, EF 30-35% 2D 04/28/12  Dyslipidemia, (HDL 25)  Diabetes mellitus  Plan- Medications adjusted by Dr Sallyanne Kuster. We will follow along.  Repeat echo/ bubble study with MD.   Kerin Ransom PA-C 05/20/2012, 9:29  AM

## 2012-05-20 NOTE — Progress Notes (Signed)
I have read and agree with the following treatment session note and discharge.  Raylene Everts, PT, DPT

## 2012-05-20 NOTE — Plan of Care (Signed)
Problem: RH Functional Use of Upper Extremity Goal: LTG Patient will use RT/LT upper extremity as a (OT) LTG: Patient will use right/left upper extremity as a stabilizer/gross assist/diminished/nondominant/dominant level with assist, with/without cues during functional activity (OT)  Outcome: Not Met (add Reason) RUE with increased weakness and tone since extension of stroke, not as quick recovery as before extension

## 2012-05-21 LAB — BASIC METABOLIC PANEL
BUN: 21 mg/dL (ref 6–23)
Chloride: 104 mEq/L (ref 96–112)
GFR calc Af Amer: 74 mL/min — ABNORMAL LOW (ref >90)
Potassium: 3.6 mEq/L (ref 3.5–5.1)

## 2012-05-21 LAB — GLUCOSE, CAPILLARY: Glucose-Capillary: 147 mg/dL — ABNORMAL HIGH (ref 70–99)

## 2012-05-21 MED ORDER — CARVEDILOL 12.5 MG PO TABS: 37.5000 mg | ORAL_TABLET | Freq: Two times a day (BID) | ORAL | Status: DC

## 2012-05-21 MED ORDER — SPIRONOLACTONE 25 MG PO TABS: 25.0000 mg | ORAL_TABLET | Freq: Every day | ORAL | Status: AC

## 2012-05-21 MED ORDER — CLOPIDOGREL BISULFATE 75 MG PO TABS: 75.0000 mg | ORAL_TABLET | Freq: Every day | ORAL | Status: AC

## 2012-05-21 MED ORDER — ISOSORBIDE DINITRATE 40 MG PO TABS: 40.0000 mg | ORAL_TABLET | Freq: Two times a day (BID) | ORAL | Status: DC

## 2012-05-21 MED ORDER — AMLODIPINE BESYLATE 5 MG PO TABS: 5.0000 mg | ORAL_TABLET | Freq: Two times a day (BID) | ORAL | Status: DC

## 2012-05-21 MED ORDER — METFORMIN HCL 850 MG PO TABS: 850.0000 mg | ORAL_TABLET | Freq: Two times a day (BID) | ORAL | Status: DC

## 2012-05-21 MED ORDER — INSULIN ASPART 100 UNIT/ML ~~LOC~~ SOLN: 3.0000 [IU] | Freq: Three times a day (TID) | SUBCUTANEOUS | Status: DC

## 2012-05-21 NOTE — Progress Notes (Signed)
Patient ID: [name], male   DOB: [birth year], 66 y.o.   MRN: [id number] Subjective/Complaints: [place] cardiology consult. Definity contrast shows no evidence of thrombus A 12 point review of systems has been performed and if not noted above is otherwise negative.  Review of Systems  Gastrointestinal: Positive for constipation.  All other systems reviewed and are negative.   Objective: Vital Signs: Blood pressure 127/83, pulse 97, temperature 98 F (36.7 C), temperature source Oral, resp. rate 20, height 5\' 8"  (1.727 m), weight 86 kg (189 lb 9.5 oz), SpO2 100.00%. No results found. No results found for this basename: WBC:2,HGB:2,HCT:2,PLT:2 in the last 72 hours  Basename 05/21/12 0600  NA 139  K 3.6  CL 104  CO2 23  GLUCOSE 110*  BUN 21  CREATININE 1.18  CALCIUM 10.3   CBG (last 3)   Basename 05/21/12 0727 05/20/12 2123 05/20/12 1621  GLUCAP 147* 85 89    Wt Readings from Last 3 Encounters:  05/14/12 86 kg (189 lb 9.5 oz)  04/28/12 87.8 kg (193 lb 9 oz)  03/09/12 91.7 kg (202 lb 2.6 oz)    Physical Exam:  Nursing note and vitals reviewed.  Constitutional: He is oriented to person, place, and time. He appears well-developed and well-nourished.  HENT:  Head: Normocephalic and atraumatic.  Eyes: Pupils are equal, round, and reactive to light.  Neck: Normal range of motion. Neck supple.  Cardiovascular: Normal rate and regular rhythm. No murmurs or gallops  Pulmonary/Chest: Effort normal and breath sounds normal. No rales or wheezes, no distress  Abdominal: Soft. Bowel sounds are normal. Slightly distended Musculoskeletal: Normal range of motion. He exhibits no edema.  Neurological: He is alert and oriented to person, place, and time.  Able to identify simple objects. Follows all commnads. Speech is low volume but very intelligible. RUE is 2/5, RLE is 2/5 -3-/5. Sensation 1/2 on right. Right central 7 and tongue deviation  Skin: Skin is warm and dry.     Assessment/Plan: 1. 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 stable for D/C  FIM: FIM - Bathing Bathing Steps Patient Completed: Chest;Right Arm;Left Arm;Abdomen;Front perineal area;Buttocks;Right upper leg;Left upper leg;Right lower leg (including foot);Left lower leg (including foot) Bathing: 5: Supervision: Safety issues/verbal cues  FIM - Upper Body Dressing/Undressing Upper body dressing/undressing steps patient completed: Thread/unthread right sleeve of pullover shirt/dresss;Thread/unthread left sleeve of pullover shirt/dress;Put head through opening of pull over shirt/dress;Pull shirt over trunk Upper body dressing/undressing: 7: Complete Independence: No helper FIM - Lower Body Dressing/Undressing Lower body dressing/undressing steps patient completed: Thread/unthread right underwear leg;Thread/unthread left underwear leg;Pull underwear up/down;Thread/unthread right pants leg;Thread/unthread left pants leg;Pull pants up/down;Don/Doff right sock;Don/Doff left sock;Don/Doff right shoe;Don/Doff left shoe;Fasten/unfasten right shoe;Fasten/unfasten left shoe Lower body dressing/undressing: 5: Set-up assist to: Don/Doff AFO/prosthesis/orthosis  FIM - Toileting Toileting steps completed by patient: Adjust clothing prior to toileting;Performs perineal hygiene;Adjust clothing after toileting Toileting Assistive Devices: Grab bar or rail for support Toileting: 6: More than reasonable amount of time  FIM - Radio producer Devices: Grab bars Toilet Transfers: 5-From toilet/BSC: Supervision (verbal cues/safety issues)  FIM - Control and instrumentation engineer Devices: Arm rests;Bed rails;HOB elevated Bed/Chair Transfer: 6: Bed > Chair or W/C: No assist;6: Supine > Sit: No assist;6: Sit > Supine: No assist;6: Chair or W/C > Bed: No assist  FIM - Locomotion: Wheelchair Distance:  150 Locomotion: Wheelchair: 6: Travels 150 ft or more, turns around, maneuvers to table, bed  or toilet, negotiates 3% grade: maneuvers on rugs and over door sills independently FIM - Locomotion: Ambulation Locomotion: Ambulation Assistive Devices: Walker - Rolling;Orthosis Ambulation/Gait Assistance: 5: Supervision Locomotion: Ambulation: 5: Travels 150 ft or more with supervision/safety issues  Comprehension Comprehension Mode: Auditory Comprehension: 6-Follows complex conversation/direction: With extra time/assistive device  Expression Expression Mode: Verbal Expression: 6-Expresses complex ideas: With extra time/assistive device  Social Interaction Social Interaction: 6-Interacts appropriately with others with medication or extra time (anti-anxiety, antidepressant).  Problem Solving Problem Solving: 6-Solves complex problems: With extra time  Memory Memory: 6-Assistive device: No helper  Medical Problem List and Plan:  1. DVT Prophylaxis/Anticoagulation: Pharmaceutical: Lovenox  2. Pain Management: N/A  3. Mood: seems appropriate. Ego support by team 4. Neuropsych: This patient is capable of making decisions on his/her own behalf.  5.HTN: uncontrolled. Continue Imdur, Prinivil, norvasc and coreg.Slowly regulate bp with scheduled meds.Norvasc, Coreg, prinivil ,Imdur no evidence  for renal artery stenosis 6. DM type 2: Controlledmonitor with bid checks. Resume metformin as studies without contrast and discontinue lantus. Fair control 7. Hypokalemia: low normal on admit labs.  8. Dyslipidemia: continue Lipitor.  9. CAD: continue Lipitor, coreg, Imdur and Prinivil. Now on ASA and plavix due to recent stent/CVA Repeat ECHO per cardiology 10. CVA: extension of previous stroke on MRI. Speech has improved but still has more weakness on R side  Continue asa and plavix per neuro      LOS (Days) 20 A FACE TO FACE EVALUATION WAS PERFORMED  Christpoher Sievers E 05/21/2012, 8:10 AM

## 2012-05-21 NOTE — Progress Notes (Signed)
Subjective: No complaints  Objective: Vital signs in last 24 hours: Temp:  [97.9 F (36.6 C)-98 F (36.7 C)] 98 F (36.7 C) (09/11 0450) Pulse Rate:  [80-97] 97  (09/11 0450) Resp:  [18-24] 20  (09/11 0450) BP: (105-139)/(73-86) 127/83 mmHg (09/11 0450) SpO2:  [96 %-100 %] 100 % (09/11 0450) Weight change:  Last BM Date: 05/19/12 Intake/Output from previous day:-140 09/10 0701 - 09/11 0700 In: 1080 [P.O.:1080] Out: 800 [Urine:800] Intake/Output this shift:    PE: General:alert and oriented Heart:S1S2 RRR Lungs:clear     Basename 05/21/12 0600  NA 139  K 3.6  CL 104  CO2 23  GLUCOSE 110*  BUN 21  CREATININE 1.18  CALCIUM 10.3    Lab Results  Component Value Date   CHOL 187 04/29/2012   HDL 37* 04/29/2012   LDLCALC 130* 04/29/2012   TRIG 101 04/29/2012   CHOLHDL 5.1 04/29/2012   Lab Results  Component Value Date   HGBA1C 8.7* 04/29/2012     Lab Results  Component Value Date   TSH 1.070 03/08/2012    Hepatic Function Panel No results found for this basename: PROT,ALBUMIN,AST,ALT,ALKPHOS,BILITOT,BILIDIR,IBILI in the last 72 hours No results found for this basename: CHOL in the last 72 hours No results found for this basename: PROTIME in the last 72 hours    EKG: Orders placed during the hospital encounter of 04/28/12  . ED EKG  . ED EKG  . EKG 12-LEAD  . EKG 12-LEAD  . EKG 12-LEAD  . EKG 12-LEAD  . EKG    Studies/Results: 2D Echo with contrast No evidence for LV apical thrombus. LVEF has markedly improved. Lifevest or AICD do not appear to be indicated for primary prevention of SCD.    Medications: I have reviewed the patient's current medications.    Marland Kitchen amLODipine  5 mg Oral BID  . aspirin  81 mg Oral Daily  . atorvastatin  40 mg Oral q1800  . carvedilol  37.5 mg Oral BID WC  . clopidogrel  75 mg Oral Q breakfast  . enoxaparin  40 mg Subcutaneous Q24H  . insulin aspart  0-5 Units Subcutaneous QHS  . insulin aspart  0-9 Units Subcutaneous  TID WC  . insulin aspart  3 Units Subcutaneous TID WC  . isosorbide dinitrate  40 mg Oral BID  . lisinopril  20 mg Oral Daily  . lisinopril  20 mg Oral QPC supper  . metFORMIN  850 mg Oral BID WC  . perflutren lipid microspheres (DEFINITY) IV suspension  1-10 mL Intravenous Once  . senna-docusate  2 tablet Oral QHS  . spironolactone  25 mg Oral Daily   Assessment/Plan: Principal Problem:  *Acute ischemic stroke. 04/28/12 Active Problems:  CAD (coronary artery disease), with CABG in 2009 after an MI  DM (diabetes mellitus),poorly controlled  NSTEMI -03/07/12 SVG-OM BMS with staged SVG-RCA DES and PDA PCI 03/10/12  Dyslipidemia, (HDL 25)  Ischemic cardiomyopathy, EF 30-35% 2D 04/28/12  HTN (hypertension), poor control  CVA exstension 05/02/12 by CT  Diabetes mellitus  PLAN:  Ok from our stand point for d/c home.  Follow up next week.   LOS: 20 days   INGOLD,LAURA R 05/21/2012, 9:59 AM   I have seen and examined the patient along with Cecilie Kicks NP.  I have reviewed the chart, notes and new data.  I agree with NP's note.  BP control is improving, albeit still not perfect. Should gradually get better with spironolactone over next 2 weeks. Will see  back in office. There are serious financial limits to his care, especially if he needs AICD  When we reassess EF in a few weeks.  Sanda Klein, MD, [place] [phone] 05/21/2012, 1:10 PM

## 2012-05-21 NOTE — Progress Notes (Signed)
Social Work Discharge Note Discharge Note  The overall goal for the admission was met for:   Discharge location: Yes-HOME WITH WIFE WHO CAN PROVIDE 24 HR SUPERVISION  Length of Stay: Yes-20 DAYS  Discharge activity level: Yes-SUPERVISION LEVEL  Home/community participation: Yes  Services provided included: MD, RD, PT, OT, SLP, RN, TR, Pharmacy and SW  Financial Services: Other: PENDING MEDICAID  Follow-up services arranged: Outpatient: [facility], OT 9/12 8;45-11;15 AM and DME: [facility]  Comments (or additional information):MEDICAID PENDING=FOLLOWED BY [facility] FOR MEDICAL FOLLOW UP  Patient/Family verbalized understanding of follow-up arrangements: Yes  Individual responsible for coordination of the follow-up plan: GERALDINE-WIFE  Confirmed correct DME delivered: Elease Hashimoto 05/21/2012    Joel Cowin, [place]

## 2012-05-21 NOTE — Progress Notes (Signed)
Patient  Discharged at 1028 to home with wife. Discharge instructions provided by Marlowe Shores, PA. Patient and family verbalized understanding. [name]

## 2012-05-22 ENCOUNTER — Ambulatory Visit: Payer: Medicaid Other | Attending: Physical Medicine & Rehabilitation | Admitting: Physical Therapy

## 2012-05-22 ENCOUNTER — Ambulatory Visit: Payer: Medicaid Other | Admitting: Occupational Therapy

## 2012-05-22 DIAGNOSIS — R269 Unspecified abnormalities of gait and mobility: Secondary | ICD-10-CM | POA: Insufficient documentation

## 2012-05-22 DIAGNOSIS — M629 Disorder of muscle, unspecified: Secondary | ICD-10-CM | POA: Insufficient documentation

## 2012-05-22 DIAGNOSIS — I69998 Other sequelae following unspecified cerebrovascular disease: Secondary | ICD-10-CM | POA: Insufficient documentation

## 2012-05-22 DIAGNOSIS — M2420 Disorder of ligament, unspecified site: Secondary | ICD-10-CM | POA: Insufficient documentation

## 2012-05-22 DIAGNOSIS — R279 Unspecified lack of coordination: Secondary | ICD-10-CM | POA: Insufficient documentation

## 2012-05-22 DIAGNOSIS — M6281 Muscle weakness (generalized): Secondary | ICD-10-CM | POA: Insufficient documentation

## 2012-05-22 DIAGNOSIS — I69959 Hemiplegia and hemiparesis following unspecified cerebrovascular disease affecting unspecified side: Secondary | ICD-10-CM | POA: Insufficient documentation

## 2012-05-22 DIAGNOSIS — Z5189 Encounter for other specified aftercare: Secondary | ICD-10-CM | POA: Insufficient documentation

## 2012-05-27 ENCOUNTER — Ambulatory Visit: Payer: Medicaid Other | Admitting: Occupational Therapy

## 2012-05-28 ENCOUNTER — Ambulatory Visit: Payer: Medicaid Other | Admitting: Occupational Therapy

## 2012-05-28 ENCOUNTER — Ambulatory Visit: Payer: Medicaid Other | Admitting: *Deleted

## 2012-05-30 NOTE — Progress Notes (Signed)
Discharge summary # [phone]

## 2012-05-31 NOTE — Discharge Summary (Signed)
[name], [name] NO.:  [id number]  MEDICAL RECORD NO.:  [id number]  LOCATION:  [id number]                         FACILITY:  [facility]  PHYSICIAN:  Charlett Blake, M.D.DATE OF BIRTH:  1945/12/17  DATE OF ADMISSION:  05/01/2012 DATE OF DISCHARGE:  05/21/2012                              DISCHARGE SUMMARY   DISCHARGE DIAGNOSES: 1. 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 2. Malignant hypertension. 3. Dyslipidemia. 4. Coronary artery disease. 5. Diabetes mellitus type 2.  HISTORY OF PRESENT ILLNESS:  [name] is a 66 year old right- handed male with history of poorly controlled diabetes mellitus, coronary artery disease with stent in June 2013.  He was admitted on April 28, 2012 with complaints of dizziness and right-sided weakness. MRI and MRA of brain done showed acute infarct in left internal capsule. No flow-limiting stenosis.  A 2D echo done showed EF of 30% with diffuse hypokinesis.  Carotid Doppler showed minimal intimal wall thickening of CCA.  The patient's Effient was changed over to Plavix per input from Cardiology.  The patient had worsening of right-sided weakness past admission, and followup CT of head showed no evidence of hemorrhage or extension. Therapies initiated and CIR was recommended for progression.  PAST MEDICAL HISTORY:  Coronary artery disease with recent Catheterization/stent, history of hypertension, hypertensive crisis, unstable angina, arthritis, DM type 2.  FUNCTIONAL HISTORY:  The patient was independent prior to admission.  FUNCTIONAL STATUS:  The patient was supervision for bed mobility, +2 total assist 80% for sit to stand transfers, +2 total assist 60% for ambulating 75 feet with rolling walker.  He required max assist for upper body bathing.  HOSPITAL COURSE:  Mr. [name] was admitted to rehab on May 01, 2012 for inpatient therapies to consist of PT, OT, and speech therapy at least 3  hours 5 days a week.  Past admission, physiatrist, rehab, RN, and therapy team have worked together to provide customized collaborative interdisciplinary care.  Rehab RN has worked with the patient on bowel and bladder program as well as safety plan.  The patient's blood pressures were monitored on b.i.d. basis and initially was reasonable, however, did trend upwards during this stay.  On the past admission, the patient had a hypotensive episode with extension of stroke.  CT of head done on August 21 showing small area of infarct in posterior limb of internal capsule, unchanged.  However, CTA of head and neck revealed slight extension of previously identified posterior limb internal capsule on left.  The patient did have worsening of his right hemiparesis.  Neurology was consulted and recommended continuing Plavix for now and avoiding hypotension.  The patient's blood pressures did start trending upwards, and Cardiology was consulted for input to assist with management of his blood pressure.  Dr. Burnett Sheng added Aldactone. Additionally recommended recheck of 2D echo to rule out thrombus as cause of his stroke.  A 2D echo with contrast was done on September 10 revealing no evidence of left LV apical thrombus and LVEF markedly improved at 45-50% with mild inferior hypokinesis.  Blood pressure at time of discharge was at 127/83.  The patient's diabetes was monitored with before meals and at bedtime blood sugar  checks.  Blood sugars are well controlled ranging from 80s to occasional high in 140s.  P.o. intake has been good.  During the patient's stay in rehab, weekly team conferences were held to monitor the patient's progress, set goals as well as discuss barriers to discharge.  Speech Therapy evaluated the patient and worked briefly with focus on speech intelligibility and cognition.  The patient was able to utilize increased vocal intensity and was at 100% intelligible.  His memory was  noted to be improved and therefore Speech Therapy signed off on August 28.  PT continued to work with the patient on activity tolerance, balance, as well as mobility.  The patient was showing improvement in right upper and right lower extremity strength and coordination as well as use by time of discharge.  He was at supervision level for transfers, supervision level for ambulating 75 feet in controlled environment with rolling walker.  He was able to navigate 1 flight of stairs with left handrail with min assist.  OT has worked with the patient on self-care tasks.  Neuro re-education was ongoing to incorporate right upper extremity use.  The patient was modified independent for grooming.  He required supervision for upper and lower body bathing.  He was independent for upper body dressing.  He required supervision for lower body dressing.  He was able to perform tub shower transfers with close supervision, and he requires supervision setup to don right shoe with AFO.  Further followup outpatient PT, OT was set up to continue past discharge.  On May 21, 2012, the patient was discharged to home in improved condition.  DISCHARGE MEDICATIONS: 1. Amlodipine 5 mg p.o. b.i.d. 2. Plavix 75 mg per day. 3. NovoLog 3 units subcu t.i.d. with meals. 4. Isordil 40 mg p.o. b.i.d. 5. Aldactone 25 mg p.o. per day. 6. Coreg 12.5 mg b.i.d. 7. Metformin 850 mg p.o. b.i.d. 8. Tylenol 650 mg p.o. q.4 h. p.r.n. pain. 9. Coated aspirin 81 mg a day. 10.Lisinopril 20 mg p.o. per day. 11.Nitroglycerin sublingual p.r.n. chest pain. 12.Zocor 40 mg q.p.m.  DIET:  Carb modified medium.  ACTIVITY LEVEL:  As tolerated with supervision.  SPECIAL INSTRUCTIONS:  Outpatient PT, OT to begin Thursday, May 22, 2012 at [facility].  FOLLOWUP:  The patient to follow up with Dr. Antony Contras in 6 weeks. Follow up with Dr. Alysia Penna, September 30 at 12:30 p.m. for 1 p.m. appointment.  Follow up  with Tarri Fuller, PA, May 26, 2012 at 2 p.m.  Follow up with [facility] next week for posthospital check.     Reesa Chew, P.A.   ______________________________ Charlett Blake, M.D.    PL/MEDQ  D:  05/30/2012  T:  05/31/2012  Job:  [id number]  cc:   Pramod P. Leonie Man, MD Sanda Klein, MD [facility]

## 2012-06-03 ENCOUNTER — Ambulatory Visit: Payer: Medicaid Other | Admitting: Physical Therapy

## 2012-06-03 ENCOUNTER — Ambulatory Visit: Payer: Medicaid Other | Admitting: Occupational Therapy

## 2012-06-05 ENCOUNTER — Ambulatory Visit: Payer: Medicaid Other | Admitting: Physical Therapy

## 2012-06-05 ENCOUNTER — Ambulatory Visit: Payer: Medicaid Other | Admitting: Occupational Therapy

## 2012-06-09 ENCOUNTER — Encounter: Payer: Self-pay | Admitting: Physical Medicine & Rehabilitation

## 2012-06-09 ENCOUNTER — Encounter: Payer: Medicaid Other | Attending: Physical Medicine & Rehabilitation

## 2012-06-09 ENCOUNTER — Ambulatory Visit (HOSPITAL_BASED_OUTPATIENT_CLINIC_OR_DEPARTMENT_OTHER): Payer: Self-pay | Admitting: Physical Medicine & Rehabilitation

## 2012-06-09 VITALS — BP 153/93 | HR 81 | Resp 14 | Ht 68.0 in | Wt 182.0 lb

## 2012-06-09 DIAGNOSIS — I2510 Atherosclerotic heart disease of native coronary artery without angina pectoris: Secondary | ICD-10-CM | POA: Insufficient documentation

## 2012-06-09 DIAGNOSIS — R269 Unspecified abnormalities of gait and mobility: Secondary | ICD-10-CM | POA: Insufficient documentation

## 2012-06-09 DIAGNOSIS — E119 Type 2 diabetes mellitus without complications: Secondary | ICD-10-CM | POA: Insufficient documentation

## 2012-06-09 DIAGNOSIS — R279 Unspecified lack of coordination: Secondary | ICD-10-CM | POA: Insufficient documentation

## 2012-06-09 DIAGNOSIS — I509 Heart failure, unspecified: Secondary | ICD-10-CM

## 2012-06-09 DIAGNOSIS — I10 Essential (primary) hypertension: Secondary | ICD-10-CM | POA: Insufficient documentation

## 2012-06-09 DIAGNOSIS — I69959 Hemiplegia and hemiparesis following unspecified cerebrovascular disease affecting unspecified side: Secondary | ICD-10-CM | POA: Insufficient documentation

## 2012-06-09 DIAGNOSIS — G8110 Spastic hemiplegia affecting unspecified side: Secondary | ICD-10-CM

## 2012-06-09 NOTE — Patient Instructions (Signed)
I have made a referral to [facility] cardiology Dr Sung Amabile Please keep your appointment with neurology Please make an appointment with your primary care physician Continue your home exercises See me in one month No driving

## 2012-06-09 NOTE — Progress Notes (Signed)
Subjective:    Patient ID: [name], male    DOB: 12-29-1945, 66 y.o.   MRN: [id number] Mr. Eryc Keitz is a 66 year old right-  handed male with history of poorly controlled diabetes mellitus,  coronary artery disease with stent in June 2013. He was admitted on  April 28, 2012 with complaints of dizziness and right-sided weakness.  MRI and MRA of brain done showed acute infarct in left internal capsule.  No flow-limiting stenosis. A 2D echo done showed EF of 30% with diffuse  hypokinesis. Carotid Doppler showed minimal intimal wall thickening of  CCA. The patient's Effient was changed over to Plavix per input from  Cardiology. The patient had worsening of right-sided weakness past  admission, and followup CT of head showed no evidence of hemorrhage or  extension.  HPI As neurology appointment ninth of October No appoint in with PCP thus far No appointment with cardiology as far  Attending outpatient neuro rehabilitation receiving PT and OT Pain Inventory Average Pain 0 Pain Right Now 0 My pain is na  In the last 24 hours, has pain interfered with the following? General activity 10 Relation with others 9 Enjoyment of life 10 What TIME of day is your pain at its worst? morning Sleep (in general) Fair  Pain is worse with: walking, bending, sitting and standing Pain improves with: therapy/exercise Relief from Meds: 0  Mobility walk with assistance use a walker how many minutes can you walk? 5 ability to climb steps?  yes do you drive?  no needs help with transfers  Function disabled: date disabled 2009 retired I need assistance with the following:  dressing, bathing and meal prep Do you have any goals in this area?  no  Neuro/Psych weakness numbness tingling trouble walking  Prior Studies Any changes since last visit?  no  Physicians involved in your care Any changes since last visit?  no   History reviewed. No pertinent family  history. History   Social History  . Marital Status: Married    Spouse Name: N/A    Number of Children: N/A  . Years of Education: N/A   Social History Main Topics  . Smoking status: Former Smoker    Quit date: 09/10/1981  . Smokeless tobacco: Never Used  . Alcohol Use: 0.6 oz/week    1 Glasses of wine per week  . Drug Use: No  . Sexually Active:    Other Topics Concern  . None   Social History Narrative  . None   Past Surgical History  Procedure Date  . Cardiac surgery   . Coronary artery bypass graft   . Cardiac catheterization    Past Medical History  Diagnosis Date  . Coronary artery disease   . Diabetes mellitus   . Arthritis   . Hypertension   . Hypertensive crisis 03/07/2012  . Unstable angina 03/07/2012  . DM (diabetes mellitus),poorly controlled 03/07/2012  . CAD (coronary artery disease), with CABG in 2009 after an MI 03/07/2012  . Myocardial infarction    BP 153/93  Pulse 81  Resp 14  Ht 5\' 8"  (1.727 m)  Wt 182 lb (82.555 kg)  BMI 27.67 kg/m2  SpO2 99%     Review of Systems  Musculoskeletal: Positive for myalgias, arthralgias and gait problem.  Neurological: Positive for weakness and numbness.  All other systems reviewed and are negative.       Objective:   Physical Exam  Constitutional: He is oriented to person, place, and time. He appears  well-developed and well-nourished.  HENT:  Head: Normocephalic and atraumatic.  Eyes: Conjunctivae normal and EOM are normal. Pupils are equal, round, and reactive to light.  Neck: Normal range of motion.  Neurological: He is alert and oriented to person, place, and time. He displays abnormal reflex. He displays no atrophy and no tremor. No sensory deficit. He exhibits abnormal muscle tone. He displays no seizure activity. Coordination and gait abnormal.  Reflex Scores:      Tricep reflexes are 3+ on the right side and 2+ on the left side.      Bicep reflexes are 3+ on the right side and 2+ on the left  side.      Brachioradialis reflexes are 3+ on the right side and 2+ on the left side.      Patellar reflexes are 3+ on the right side and 2+ on the left side.      Achilles reflexes are 3+ on the right side and 2+ on the left side.      Motor strength: 3 minus deltoid, biceps, triceps, grip, hip flexor, knee extensor 2 minus ankle dorsiflexor Sensation is intact Increased tone in the flexor muscle groups in the right upper extremity and in the extensor muscle groups in the right lower extremity Gait is using a right AFO as well as a walker no signs of toe drag or knee instability  Psychiatric: He has a normal mood and affect.          Assessment & Plan:  1. 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 She remote her stroke. Progressing with outpatient PT and OT. Recommend continuing home exercise program. 2. Secondary stroke prevention followup with primary care for hypertension as well as diabetes Followup with neurology for secondary stroke prevention Followup with cardiology for CHF, patient and wife request of [facility]

## 2012-06-10 ENCOUNTER — Encounter: Payer: Self-pay | Admitting: Occupational Therapy

## 2012-06-10 ENCOUNTER — Ambulatory Visit: Payer: Medicaid Other | Attending: Physical Medicine & Rehabilitation | Admitting: Occupational Therapy

## 2012-06-10 ENCOUNTER — Ambulatory Visit: Payer: Self-pay | Admitting: Physical Therapy

## 2012-06-10 ENCOUNTER — Ambulatory Visit: Payer: Medicaid Other | Admitting: Physical Therapy

## 2012-06-10 DIAGNOSIS — Z5189 Encounter for other specified aftercare: Secondary | ICD-10-CM | POA: Insufficient documentation

## 2012-06-10 DIAGNOSIS — M629 Disorder of muscle, unspecified: Secondary | ICD-10-CM | POA: Insufficient documentation

## 2012-06-10 DIAGNOSIS — I69998 Other sequelae following unspecified cerebrovascular disease: Secondary | ICD-10-CM | POA: Insufficient documentation

## 2012-06-10 DIAGNOSIS — M6281 Muscle weakness (generalized): Secondary | ICD-10-CM | POA: Insufficient documentation

## 2012-06-10 DIAGNOSIS — I69959 Hemiplegia and hemiparesis following unspecified cerebrovascular disease affecting unspecified side: Secondary | ICD-10-CM | POA: Insufficient documentation

## 2012-06-10 DIAGNOSIS — R269 Unspecified abnormalities of gait and mobility: Secondary | ICD-10-CM | POA: Insufficient documentation

## 2012-06-10 DIAGNOSIS — M2420 Disorder of ligament, unspecified site: Secondary | ICD-10-CM | POA: Insufficient documentation

## 2012-06-10 DIAGNOSIS — R279 Unspecified lack of coordination: Secondary | ICD-10-CM | POA: Insufficient documentation

## 2012-06-13 ENCOUNTER — Ambulatory Visit: Payer: Medicaid Other | Admitting: Occupational Therapy

## 2012-06-13 ENCOUNTER — Ambulatory Visit: Payer: Medicaid Other | Admitting: *Deleted

## 2012-06-17 ENCOUNTER — Ambulatory Visit: Payer: Medicaid Other | Admitting: *Deleted

## 2012-06-17 ENCOUNTER — Ambulatory Visit: Payer: Medicaid Other | Admitting: Occupational Therapy

## 2012-06-19 ENCOUNTER — Ambulatory Visit: Payer: Medicaid Other | Admitting: *Deleted

## 2012-06-24 ENCOUNTER — Ambulatory Visit: Payer: Medicaid Other | Admitting: Physical Therapy

## 2012-06-27 ENCOUNTER — Ambulatory Visit: Payer: Medicaid Other | Admitting: Physical Therapy

## 2012-07-01 ENCOUNTER — Ambulatory Visit: Payer: Medicaid Other | Admitting: *Deleted

## 2012-07-01 ENCOUNTER — Ambulatory Visit: Payer: Medicaid Other | Admitting: Occupational Therapy

## 2012-07-02 ENCOUNTER — Encounter: Payer: Self-pay | Admitting: Occupational Therapy

## 2012-07-02 ENCOUNTER — Ambulatory Visit: Payer: Self-pay | Admitting: *Deleted

## 2012-07-04 ENCOUNTER — Ambulatory Visit (HOSPITAL_BASED_OUTPATIENT_CLINIC_OR_DEPARTMENT_OTHER): Payer: Medicaid Other | Admitting: Physical Medicine & Rehabilitation

## 2012-07-04 ENCOUNTER — Encounter: Payer: Self-pay | Admitting: Physical Medicine & Rehabilitation

## 2012-07-04 ENCOUNTER — Encounter: Payer: Medicaid Other | Attending: Physical Medicine & Rehabilitation

## 2012-07-04 VITALS — BP 123/81 | HR 91 | Resp 14 | Ht 68.0 in | Wt 179.0 lb

## 2012-07-04 DIAGNOSIS — I10 Essential (primary) hypertension: Secondary | ICD-10-CM | POA: Insufficient documentation

## 2012-07-04 DIAGNOSIS — I2510 Atherosclerotic heart disease of native coronary artery without angina pectoris: Secondary | ICD-10-CM | POA: Insufficient documentation

## 2012-07-04 DIAGNOSIS — E119 Type 2 diabetes mellitus without complications: Secondary | ICD-10-CM | POA: Insufficient documentation

## 2012-07-04 DIAGNOSIS — I69959 Hemiplegia and hemiparesis following unspecified cerebrovascular disease affecting unspecified side: Secondary | ICD-10-CM | POA: Insufficient documentation

## 2012-07-04 DIAGNOSIS — G8110 Spastic hemiplegia affecting unspecified side: Secondary | ICD-10-CM | POA: Insufficient documentation

## 2012-07-04 DIAGNOSIS — R279 Unspecified lack of coordination: Secondary | ICD-10-CM | POA: Insufficient documentation

## 2012-07-04 DIAGNOSIS — R269 Unspecified abnormalities of gait and mobility: Secondary | ICD-10-CM | POA: Insufficient documentation

## 2012-07-04 NOTE — Progress Notes (Signed)
Subjective:    Patient ID: [name], male    DOB: 08-31-1946, 66 y.o.   MRN: [id number]  HPI [name] is a 66 year old right-  handed male with history of poorly controlled diabetes mellitus,  coronary artery disease with stent in June 2013. He was admitted on  April 28, 2012 with complaints of dizziness and right-sided weakness.  MRI and MRA of brain done showed acute infarct in left internal capsule.  No flow-limiting stenosis. A 2D echo done showed EF of 30% with diffuse  hypokinesis. Carotid Doppler showed minimal intimal wall thickening of  CCA. The patient's Effient was changed over to Plavix per input from  Cardiology. The patient had worsening of right-sided weakness past  admission, and followup CT of head showed no evidence of hemorrhage or  Extension.  Receiving PT and OT at neuro rehabilitation twice a week Pain Inventory Average Pain 2 Pain Right Now 0 My pain is tingling and aching  In the last 24 hours, has pain interfered with the following? General activity 0 Relation with others 0 Enjoyment of life 0 What TIME of day is your pain at its worst? morning Sleep (in general) Fair  Pain is worse with: walking, bending and standing Pain improves with: rest and medication Relief from Meds: 7  Mobility walk without assistance walk with assistance use a cane use a walker how many minutes can you walk? 5 ability to climb steps?  yes do you drive?  no Do you have any goals in this area?  yes  Function retired I need assistance with the following:  dressing and bathing  Neuro/Psych weakness numbness tingling trouble walking loss of taste or smell  Prior Studies Any changes since last visit?  no  Physicians involved in your care Any changes since last visit?  no   History reviewed. No pertinent family history. History   Social History  . Marital Status: Married    Spouse Name: N/A    Number of Children: N/A  . Years of  Education: N/A   Social History Main Topics  . Smoking status: Former Smoker    Quit date: 09/10/1981  . Smokeless tobacco: Never Used  . Alcohol Use: 0.6 oz/week    1 Glasses of wine per week  . Drug Use: No  . Sexually Active:    Other Topics Concern  . None   Social History Narrative  . None   Past Surgical History  Procedure Date  . Cardiac surgery   . Coronary artery bypass graft   . Cardiac catheterization    Past Medical History  Diagnosis Date  . Coronary artery disease   . Diabetes mellitus   . Arthritis   . Hypertension   . Hypertensive crisis 03/07/2012  . Unstable angina 03/07/2012  . DM (diabetes mellitus),poorly controlled 03/07/2012  . CAD (coronary artery disease), with CABG in 2009 after an MI 03/07/2012  . Myocardial infarction    BP 123/81  Pulse 91  Resp 14  Ht 5\' 8"  (1.727 m)  Wt 179 lb (81.194 kg)  BMI 27.22 kg/m2  SpO2 99%     Review of Systems  Constitutional: Positive for appetite change and unexpected weight change.  Musculoskeletal: Positive for myalgias, arthralgias and gait problem.  Neurological: Positive for weakness and numbness.  All other systems reviewed and are negative.       Objective:   Physical Exam  Constitutional: He is oriented to person, place, and time. He appears well-developed and well-nourished.  Eyes: Conjunctivae normal and EOM are normal. Pupils are equal, round, and reactive to light.  Musculoskeletal:       Right shoulder: He exhibits decreased range of motion. He exhibits no tenderness and no pain.  Neurological: He is alert and oriented to person, place, and time. No sensory deficit. He exhibits abnormal muscle tone. Coordination and gait abnormal.  Reflex Scores:      Tricep reflexes are 3+ on the right side and 2+ on the left side.      Bicep reflexes are 3+ on the right side and 2+ on the left side.      Brachioradialis reflexes are 3+ on the right side and 2+ on the left side.      Patellar  reflexes are 3+ on the right side and 2+ on the left side.      Achilles reflexes are 3+ on the right side and 2+ on the left side. Psychiatric: He has a normal mood and affect.   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 We discussed spasticity medications or injections. At this point will hold off. 2. Heart failure will followup with cardiology

## 2012-07-08 ENCOUNTER — Ambulatory Visit: Payer: Medicaid Other | Admitting: Physical Therapy

## 2012-07-08 ENCOUNTER — Ambulatory Visit: Payer: Medicaid Other | Admitting: Occupational Therapy

## 2012-07-10 ENCOUNTER — Ambulatory Visit: Payer: Medicaid Other | Admitting: *Deleted

## 2012-07-10 ENCOUNTER — Ambulatory Visit: Payer: Medicaid Other | Admitting: Occupational Therapy

## 2012-07-14 ENCOUNTER — Ambulatory Visit: Payer: Self-pay | Admitting: Physical Therapy

## 2012-07-14 ENCOUNTER — Encounter: Payer: Self-pay | Admitting: Occupational Therapy

## 2012-07-17 ENCOUNTER — Encounter: Payer: Self-pay | Admitting: Occupational Therapy

## 2012-07-17 ENCOUNTER — Ambulatory Visit: Payer: Self-pay | Admitting: Physical Therapy

## 2012-07-22 ENCOUNTER — Ambulatory Visit: Payer: Medicaid Other | Admitting: *Deleted

## 2012-07-23 ENCOUNTER — Encounter: Payer: Medicaid Other | Admitting: Occupational Therapy

## 2012-07-24 ENCOUNTER — Ambulatory Visit: Payer: Medicaid Other | Admitting: *Deleted

## 2012-07-30 ENCOUNTER — Encounter: Payer: Medicaid Other | Admitting: Occupational Therapy

## 2012-08-01 ENCOUNTER — Ambulatory Visit: Payer: Medicaid Other | Admitting: Physical Therapy

## 2012-08-04 ENCOUNTER — Encounter: Payer: Medicaid Other | Attending: Physical Medicine & Rehabilitation

## 2012-08-04 ENCOUNTER — Ambulatory Visit (HOSPITAL_BASED_OUTPATIENT_CLINIC_OR_DEPARTMENT_OTHER): Payer: Medicaid Other | Admitting: Physical Medicine & Rehabilitation

## 2012-08-04 ENCOUNTER — Encounter: Payer: Self-pay | Admitting: Physical Medicine & Rehabilitation

## 2012-08-04 VITALS — BP 143/86 | HR 94 | Resp 18 | Ht 68.0 in | Wt 177.4 lb

## 2012-08-04 DIAGNOSIS — IMO0002 Reserved for concepts with insufficient information to code with codable children: Secondary | ICD-10-CM

## 2012-08-04 DIAGNOSIS — I69959 Hemiplegia and hemiparesis following unspecified cerebrovascular disease affecting unspecified side: Secondary | ICD-10-CM | POA: Insufficient documentation

## 2012-08-04 DIAGNOSIS — M75100 Unspecified rotator cuff tear or rupture of unspecified shoulder, not specified as traumatic: Secondary | ICD-10-CM

## 2012-08-04 DIAGNOSIS — E119 Type 2 diabetes mellitus without complications: Secondary | ICD-10-CM | POA: Insufficient documentation

## 2012-08-04 DIAGNOSIS — R269 Unspecified abnormalities of gait and mobility: Secondary | ICD-10-CM | POA: Insufficient documentation

## 2012-08-04 DIAGNOSIS — I10 Essential (primary) hypertension: Secondary | ICD-10-CM | POA: Insufficient documentation

## 2012-08-04 DIAGNOSIS — R279 Unspecified lack of coordination: Secondary | ICD-10-CM | POA: Insufficient documentation

## 2012-08-04 DIAGNOSIS — G8110 Spastic hemiplegia affecting unspecified side: Secondary | ICD-10-CM

## 2012-08-04 DIAGNOSIS — I509 Heart failure, unspecified: Secondary | ICD-10-CM

## 2012-08-04 DIAGNOSIS — M7550 Bursitis of unspecified shoulder: Secondary | ICD-10-CM

## 2012-08-04 DIAGNOSIS — I2510 Atherosclerotic heart disease of native coronary artery without angina pectoris: Secondary | ICD-10-CM | POA: Insufficient documentation

## 2012-08-04 NOTE — Progress Notes (Signed)
Subjective:    Patient ID: [name], male    DOB: 05-26-1946, 66 y.o.   MRN: [id number] [name] is a 66 year old right-  handed male with history of poorly controlled diabetes mellitus,  coronary artery disease with stent in June 2013. He was admitted on  April 28, 2012 with complaints of dizziness and right-sided weakness.  MRI and MRA of brain done showed acute infarct in left internal capsule.  No flow-limiting stenosis. A 2D echo done showed EF of 30% with diffuse  hypokinesis. Carotid Doppler showed minimal intimal wall thickening of  CCA. The patient's Effient was changed over to Plavix per input from  Cardiology. The patient had worsening of right-sided weakness past  admission, and followup CT of head showed no evidence of hemorrhage or  Extension.   HPI Finished PT and OT at neuro rehabilitation twice a week Doing home exercises  Saw PCP increased coreg Pain Inventory Average Pain 8 Pain Right Now 8 My pain is tingling  In the last 24 hours, has pain interfered with the following? General activity 4 Relation with others 4 Enjoyment of life 4 What TIME of day is your pain at its worst? morning Sleep (in general) Good  Pain is worse with: walking and bending Pain improves with: heat/ice and medication Relief from Meds: 6  Mobility walk without assistance use a cane do you drive?  no  Function retired  Neuro/Psych trouble walking  Prior Studies Any changes since last visit?  no  Physicians involved in your care Any changes since last visit?  no   History reviewed. No pertinent family history. History   Social History  . Marital Status: Married    Spouse Name: N/A    Number of Children: N/A  . Years of Education: N/A   Social History Main Topics  . Smoking status: Former Smoker    Quit date: 09/10/1981  . Smokeless tobacco: Never Used  . Alcohol Use: 0.6 oz/week    1 Glasses of wine per week  . Drug Use: No  . Sexually  Active:    Other Topics Concern  . None   Social History Narrative  . None   Past Surgical History  Procedure Date  . Cardiac surgery   . Coronary artery bypass graft   . Cardiac catheterization    Past Medical History  Diagnosis Date  . Coronary artery disease   . Diabetes mellitus   . Arthritis   . Hypertension   . Hypertensive crisis 03/07/2012  . Unstable angina 03/07/2012  . DM (diabetes mellitus),poorly controlled 03/07/2012  . CAD (coronary artery disease), with CABG in 2009 after an MI 03/07/2012  . Myocardial infarction    BP 143/86  Pulse 94  Resp 18  Ht 5\' 8"  (1.727 m)  Wt 177 lb 6.4 oz (80.468 kg)  BMI 26.97 kg/m2  SpO2 99%    Review of Systems  Constitutional: Positive for unexpected weight change.  Musculoskeletal: Positive for myalgias, arthralgias and gait problem.  All other systems reviewed and are negative.       Objective:   Physical Exam  Constitutional: He is oriented to person, place, and time. He appears well-developed and well-nourished.  Eyes: Conjunctivae normal and EOM are normal. Pupils are equal, round, and reactive to light.  Musculoskeletal:       Right shoulder: He exhibits decreased range of motion. He exhibits no tenderness and no pain.  Neurological: He is alert and oriented to person, place, and time.  No sensory deficit. He exhibits abnormal muscle tone. Coordination and gait abnormal.  Reflex Scores:      Tricep reflexes are 3+ on the right side and 2+ on the left side.      Bicep reflexes are 3+ on the right side and 2+ on the left side.      Brachioradialis reflexes are 3+ on the right side and 2+ on the left side.      Patellar reflexes are 3+ on the right side and 2+ on the left side.      Achilles reflexes are 3+ on the right side and 2+ on the left side. Psychiatric: He has a normal mood and affect.         Assessment & Plan:  1. CVA with right hemiparesis now with hemiplegic shoulder pain and positive  impingement sign. Suspect subacromial bursitis we'll inject today and if no improvement may need ultrasound shoulder  Right subacromial bursa injection Indication right subacromial bursitis Informed consent was obtained after describing risks and benefits of the procedure the patient is include bleeding bruising and infection he elects to perceive and has given written consent Patient placed in a seated position lateral approach. Area marked prepped with Betadine and alcohol then entered with 25-gauge inch needle after negative draw back for blood 1 cc of 40 mg Depo-Medrol and 4 cc of 1% lidocaine injected. Patient tolerated procedure well. Post procedure instructions given

## 2012-08-04 NOTE — Patient Instructions (Signed)
Return to clinic in about 3 weeks,Recheck shoulder if no better will do ultrasound to further assess

## 2012-08-25 ENCOUNTER — Encounter: Payer: Self-pay | Admitting: Physical Medicine & Rehabilitation

## 2012-08-25 ENCOUNTER — Ambulatory Visit (HOSPITAL_BASED_OUTPATIENT_CLINIC_OR_DEPARTMENT_OTHER): Payer: Medicaid Other | Admitting: Physical Medicine & Rehabilitation

## 2012-08-25 ENCOUNTER — Encounter: Payer: Medicaid Other | Attending: Physical Medicine & Rehabilitation

## 2012-08-25 VITALS — BP 137/88 | HR 88 | Resp 14 | Ht 68.0 in | Wt 175.0 lb

## 2012-08-25 DIAGNOSIS — R279 Unspecified lack of coordination: Secondary | ICD-10-CM | POA: Insufficient documentation

## 2012-08-25 DIAGNOSIS — R269 Unspecified abnormalities of gait and mobility: Secondary | ICD-10-CM | POA: Insufficient documentation

## 2012-08-25 DIAGNOSIS — I69959 Hemiplegia and hemiparesis following unspecified cerebrovascular disease affecting unspecified side: Secondary | ICD-10-CM | POA: Insufficient documentation

## 2012-08-25 DIAGNOSIS — I10 Essential (primary) hypertension: Secondary | ICD-10-CM | POA: Insufficient documentation

## 2012-08-25 DIAGNOSIS — E119 Type 2 diabetes mellitus without complications: Secondary | ICD-10-CM | POA: Insufficient documentation

## 2012-08-25 DIAGNOSIS — M25519 Pain in unspecified shoulder: Secondary | ICD-10-CM

## 2012-08-25 DIAGNOSIS — I2510 Atherosclerotic heart disease of native coronary artery without angina pectoris: Secondary | ICD-10-CM | POA: Insufficient documentation

## 2012-08-25 DIAGNOSIS — M25511 Pain in right shoulder: Secondary | ICD-10-CM

## 2012-08-25 MED ORDER — BACLOFEN 10 MG PO TABS: 10.0000 mg | ORAL_TABLET | Freq: Three times a day (TID) | ORAL | Status: DC

## 2012-08-25 NOTE — Patient Instructions (Signed)
Adhesive Capsulitis Sometimes the shoulder becomes stiff and is painful to move. Some people say it feels as if the shoulder is frozen in place. Because of this, the condition is called "frozen shoulder." Its medical name is adhesive capsulitis.  The shoulder joint is made up of strong connective tissue that attaches the ball of the humerus to the shallow shoulder socket. This strong connective tissue is called the joint capsule. This tissue can become stiff and swollen. That is when adhesive capsulitis sets in. CAUSES  It is not always clear just what the cause adhesive capsulitis. Possibilities include:  Injury to the shoulder joint.  Strain. This is a repetitive injury brought about by overuse.  Lack of use. Perhaps your arm or hand was otherwise injured. It might have been in a sling for awhile. Or perhaps you were not using it to avoid pain.  Referred pain. This is a sort of trick the body plays. You feel pain in the shoulder. But, the pain actually comes from an injury somewhere else in the body.  Long-standing health problems. Several diseases can cause adhesive capsulitis. They include diabetes, heart disease, stroke, thyroid problems, rheumatoid arthritis and lung disease.  Being a women older than 38. Anyone can develop adhesive capsulitis but it is most common in women in this age group. SYMPTOMS   Pain.  It occurs when the arm is moved.  Parts of the shoulder might hurt if they are touched.  Pain is worse at night or when resting.  Soreness. It might not be strong enough to be called pain. But, the shoulder aches.  The shoulder does not move freely.  Muscle spasms.  Trouble sleeping because of shoulder ache or pain. DIAGNOSIS  To decide if you have adhesive capsulitis, your healthcare provider will probably:  Ask about symptoms you have noticed.  Ask about your history of joint pain and anything that might have caused the pain.  Ask about your overall  health.  Use hands to feel your shoulder and neck.  Ask you to move your shoulder in specific directions. This may indicate the origin of the pain.  Order imaging tests; pictures of the shoulder. They help pinpoint the source of the problem. An X-ray might be used. For more detail, an MRI is often used. An MRI details the tendons, muscles and ligaments as well as the joint. TREATMENT  Adhesive capsulitis can be treated several ways. Most treatments can be done in a clinic or in your healthcare provider's office. Be sure to discuss the different options with your caregiver. They include:  Physical therapy. You will work on specific exercises to get your shoulder moving again. The exercises usually involve stretching. A physical therapist (a caregiver with special training) can show you what to do and what not to do. The exercises will need to be done daily.  Medication.  Over-the-counter medicines may relieve pain and inflammation (the body's way of reacting to injury or infection).  Corticosteroids. These are stronger drugs to reduce pain and inflammation. They are given by injection (shots) into the shoulder joint. Muscle relaxants. Medication may be prescribed to ease muscle spasms.  Treatment of underlying conditions. This means treating another condition that is causing your shoulder problem. This might be a rotator cuff (tendon) problem  Shoulder manipulation. The shoulder will be moved by your healthcare provider. You would be under general anesthesia (given a drug that puts you to sleep). You would not feel anything. Sometimes the joint will be injected with  salt water (saline) at high pressure to break down internal scarring in the joint capsule.  Surgery. This is rarely needed. It may be suggested in advanced cases after all other treatment has failed. PROGNOSIS  In time, most people recover from adhesive capsulitis. Sometimes, however, the pain goes away but full movement of the  shoulder does not return.  HOME CARE INSTRUCTIONS   Take any pain medications recommended by your healthcare provider. Follow the directions carefully.  If you have physical therapy, follow through with the therapist's suggestions. Be sure you understand the exercises you will be doing. You should understand:  How often the exercises should be done.  How many times each exercise should be repeated.  How long they should be done.  What other activities you should do, or not do.  That you should warm up before doing any exercise. Just 5 to 10 minutes will help. Small, gentle movements should get your shoulder ready for more.  Avoid high-demand exercise that involves your shoulder such as throwing. This type of exercise can make pain worse.  Consider using cold packs. Cold may ease swelling and pain. Ask your healthcare provider if a cold pack might help you. If so, get directions on how and when to use them. SEEK MEDICAL CARE IF:   You have any questions about your medications.  Your pain continues to increase. Document Released: 06/24/2009 Document Revised: 11/19/2011 Document Reviewed: 06/24/2009 [facility] Patient Information 2013 [place].

## 2012-08-25 NOTE — Progress Notes (Signed)
Right shoulder complete ultrasound Indication is right shoulder pain post stroke which does not respond to subacromial injection Right biceps tendon the short axis view 3.8 mm no evidence of effusion no evidence of dislocation. Right biceps tendon short axis and long axis views show no evidence of tear Right subscapularis tendon long axis views and short axis views are normal Right supraspinatus tendon long axis views no evidence of tear no evidence of subacromial bursitis. Tendon thickness is 6 mm, short axis views were normal Right infraspinatus tendon long axis view in short axis views were normal Subacromial views with dynamic testing. Limitation in range of motion limited study Right a.c. Joint long axis views normal  Impression 1. Limited study due to range of motion deficits could not perform dynamic testing for impingement. Suspect adhesive capsulitis 2. No evidence of rotator cuff tears

## 2012-09-25 ENCOUNTER — Encounter: Payer: Medicaid Other | Attending: Physical Medicine & Rehabilitation

## 2012-09-25 ENCOUNTER — Encounter: Payer: Self-pay | Admitting: Physical Medicine & Rehabilitation

## 2012-09-25 ENCOUNTER — Ambulatory Visit (HOSPITAL_BASED_OUTPATIENT_CLINIC_OR_DEPARTMENT_OTHER): Payer: Medicaid Other | Admitting: Physical Medicine & Rehabilitation

## 2012-09-25 VITALS — BP 168/97 | HR 89 | Resp 14 | Ht 68.0 in | Wt 175.8 lb

## 2012-09-25 DIAGNOSIS — E119 Type 2 diabetes mellitus without complications: Secondary | ICD-10-CM | POA: Insufficient documentation

## 2012-09-25 DIAGNOSIS — M7501 Adhesive capsulitis of right shoulder: Secondary | ICD-10-CM | POA: Insufficient documentation

## 2012-09-25 DIAGNOSIS — R269 Unspecified abnormalities of gait and mobility: Secondary | ICD-10-CM | POA: Insufficient documentation

## 2012-09-25 DIAGNOSIS — I2510 Atherosclerotic heart disease of native coronary artery without angina pectoris: Secondary | ICD-10-CM | POA: Insufficient documentation

## 2012-09-25 DIAGNOSIS — R279 Unspecified lack of coordination: Secondary | ICD-10-CM | POA: Insufficient documentation

## 2012-09-25 DIAGNOSIS — I10 Essential (primary) hypertension: Secondary | ICD-10-CM | POA: Insufficient documentation

## 2012-09-25 DIAGNOSIS — I69959 Hemiplegia and hemiparesis following unspecified cerebrovascular disease affecting unspecified side: Secondary | ICD-10-CM | POA: Insufficient documentation

## 2012-09-25 DIAGNOSIS — M7500 Adhesive capsulitis of unspecified shoulder: Secondary | ICD-10-CM

## 2012-09-25 NOTE — Patient Instructions (Signed)
Joint Injection  Care After  Refer to this sheet in the next few days. These instructions provide you with information on caring for yourself after you have had a joint injection. Your caregiver also may give you more specific instructions. Your treatment has been planned according to current medical practices, but problems sometimes occur. Call your caregiver if you have any problems or questions after your procedure.  After any type of joint injection, it is not uncommon to experience:  · Soreness, swelling, or bruising around the injection site.  · Mild numbness, tingling, or weakness around the injection site caused by the numbing medicine used before or with the injection.  It also is possible to experience the following effects associated with the specific agent after injection:  · Iodine-based contrast agents:  · Allergic reaction (itching, hives, widespread redness, and swelling beyond the injection site).  · Corticosteroids (These effects are rare.):  · Allergic reaction.  · Increased blood sugar levels (If you have diabetes and you notice that your blood sugar levels have increased, notify your caregiver).  · Increased blood pressure levels.  · Mood swings.  · Hyaluronic acid in the use of viscosupplementation.  · Temporary heat or redness.  · Temporary rash and itching.  · Increased fluid accumulation in the injected joint.  These effects all should resolve within a day after your procedure.   HOME CARE INSTRUCTIONS  · Limit yourself to light activity the day of your procedure. Avoid lifting heavy objects, bending, stooping, or twisting.  · Take prescription or over-the-counter pain medication as directed by your caregiver.  · You may apply ice to your injection site to reduce pain and swelling the day of your procedure. Ice may be applied 3 to 4 times:  · Put ice in a plastic bag.  · Place a towel between your skin and the bag.  · Leave the ice on for no longer than 15 to 20 minutes each time.  SEEK  IMMEDIATE MEDICAL CARE IF:   · Pain and swelling get worse rather than better or extend beyond the injection site.  · Numbness does not go away.  · Blood or fluid continues to leak from the injection site.  · You have chest pain.  · You have swelling of your face or tongue.  · You have trouble breathing or you become dizzy.  · You develop a fever, chills, or severe tenderness at the injection site that last longer than 1 day.  MAKE SURE YOU:  · Understand these instructions.  · Watch your condition.  · Get help right away if you are not doing well or if you get worse.  Document Released: 05/10/2011 Document Revised: 11/19/2011 Document Reviewed: 05/10/2011  [employer]® Patient Information ©2013 [employer].

## 2012-09-25 NOTE — Progress Notes (Signed)
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 Informed consent was obtained after describing risks and benefits of the procedure with the patient these include bleeding bruising and infection he elects to proceed and has given written consent Patient placed in a sitting position posterior lateral approach utilized. The glenohumeral joint was visualized the area was marked and prepped with Betadine. Skin and subcutaneous tissues were were infiltrated with 1% lidocaine x3 cc. 22-gauge echo block needle was inserted under ultrasound guidance. This was directed into the glenoid humeral joint. Then 1 cc of 40 mg Depo-Medrol and 4 cc 1% lidocaine were injected. Patient tolerated procedure well Post procedure instructions given

## 2012-10-24 ENCOUNTER — Ambulatory Visit: Payer: Self-pay | Admitting: Physical Medicine & Rehabilitation

## 2012-11-12 ENCOUNTER — Encounter: Payer: Self-pay | Admitting: Physician Assistant

## 2012-12-05 ENCOUNTER — Encounter: Payer: Self-pay | Admitting: Cardiovascular Disease

## 2013-08-10 ENCOUNTER — Other Ambulatory Visit (HOSPITAL_COMMUNITY): Payer: Self-pay | Admitting: Cardiology

## 2013-08-10 DIAGNOSIS — I209 Angina pectoris, unspecified: Secondary | ICD-10-CM

## 2013-08-19 ENCOUNTER — Other Ambulatory Visit: Payer: Self-pay

## 2013-08-19 ENCOUNTER — Encounter (HOSPITAL_COMMUNITY)
Admission: RE | Admit: 2013-08-19 | Discharge: 2013-08-19 | Disposition: A | Discharge status: Home / Self Care | Payer: Medicare Other | Source: Ambulatory Visit | Attending: Cardiology | Admitting: Cardiology

## 2013-08-19 DIAGNOSIS — I209 Angina pectoris, unspecified: Secondary | ICD-10-CM | POA: Insufficient documentation

## 2013-08-19 MED ORDER — TECHNETIUM TC 99M SESTAMIBI GENERIC - CARDIOLITE
10.0000 | Freq: Once | INTRAVENOUS | Status: AC | PRN
  Administered 2013-08-19: 10 via INTRAVENOUS

## 2013-08-19 MED ORDER — TECHNETIUM TC 99M SESTAMIBI GENERIC - CARDIOLITE
30.0000 | Freq: Once | INTRAVENOUS | Status: AC | PRN
  Administered 2013-08-19: 30 via INTRAVENOUS

## 2013-08-19 MED ORDER — REGADENOSON 0.4 MG/5ML IV SOLN
INTRAVENOUS | Status: AC
  Administered 2013-08-19: 0.4 mg via INTRAVENOUS
  Filled 2013-08-19: qty 5

## 2014-03-01 HISTORY — PX: PROSTATE BIOPSY: SHX241

## 2014-03-05 ENCOUNTER — Other Ambulatory Visit: Payer: Self-pay | Admitting: Urology

## 2014-03-05 DIAGNOSIS — C61 Malignant neoplasm of prostate: Secondary | ICD-10-CM

## 2014-03-25 ENCOUNTER — Encounter (HOSPITAL_COMMUNITY)
Admission: RE | Admit: 2014-03-25 | Discharge: 2014-03-25 | Disposition: A | Discharge status: Home / Self Care | Payer: Medicare Other | Source: Ambulatory Visit | Attending: Urology | Admitting: Urology

## 2014-03-25 DIAGNOSIS — C61 Malignant neoplasm of prostate: Secondary | ICD-10-CM | POA: Insufficient documentation

## 2014-03-25 MED ORDER — TECHNETIUM TC 99M MEDRONATE IV KIT
26.0000 | PACK | Freq: Once | INTRAVENOUS | Status: AC | PRN
  Administered 2014-03-25: 26 via INTRAVENOUS

## 2014-04-07 ENCOUNTER — Encounter: Payer: Self-pay | Admitting: Radiation Oncology

## 2014-04-07 NOTE — Progress Notes (Signed)
GU Location of Tumor / Histology: prostate cancer  If Prostate Cancer, Gleason Score is (4 + 4=8) and PSA is (27.2 from 4/15) and prostate volume of 50 ml.  [name] presented with an elevated PSA - 27.2 in May of 2015 up from 17 on 1/14  Biopsies revealed:  03/01/2014   Past/Anticipated interventions by urology, if any: prostate biopsy on 03/01/14.  Plan to start 2 years of androgen ablation.  Started on frimagon.  Past/Anticipated interventions by medical oncology, if any: none  Weight changes, if any: no  Bowel/Bladder complaints, if any: no bowel complaints.  IPSS score of 17 - reports urinary frequency and trouble emptying his bladder.  Nausea/Vomiting, if any: no  Pain issues, if any:  Has soreness in his abdomen from injection last week (7/21).  SAFETY ISSUES:  Prior radiation? no  Pacemaker/ICD? no  Possible current pregnancy? no  Is the patient on methotrexate? no  Current Complaints / other details:  Patient is here with his wife and daughter.

## 2014-04-08 ENCOUNTER — Ambulatory Visit
Admission: RE | Admit: 2014-04-08 | Discharge: 2014-04-08 | Disposition: A | Discharge status: Home / Self Care | Payer: Medicare Other | Source: Ambulatory Visit | Attending: Radiation Oncology | Admitting: Radiation Oncology

## 2014-04-08 ENCOUNTER — Encounter: Payer: Self-pay | Admitting: Radiation Oncology

## 2014-04-08 VITALS — BP 173/100 | HR 73 | Temp 97.6°F | Ht 68.0 in | Wt 176.0 lb

## 2014-04-08 DIAGNOSIS — C61 Malignant neoplasm of prostate: Secondary | ICD-10-CM | POA: Diagnosis not present

## 2014-04-08 DIAGNOSIS — I2510 Atherosclerotic heart disease of native coronary artery without angina pectoris: Secondary | ICD-10-CM | POA: Diagnosis not present

## 2014-04-08 DIAGNOSIS — Z9861 Coronary angioplasty status: Secondary | ICD-10-CM | POA: Diagnosis not present

## 2014-04-08 DIAGNOSIS — E119 Type 2 diabetes mellitus without complications: Secondary | ICD-10-CM | POA: Diagnosis not present

## 2014-04-08 DIAGNOSIS — Z7982 Long term (current) use of aspirin: Secondary | ICD-10-CM | POA: Insufficient documentation

## 2014-04-08 DIAGNOSIS — I10 Essential (primary) hypertension: Secondary | ICD-10-CM | POA: Insufficient documentation

## 2014-04-08 DIAGNOSIS — R29898 Other symptoms and signs involving the musculoskeletal system: Secondary | ICD-10-CM | POA: Diagnosis not present

## 2014-04-08 DIAGNOSIS — I252 Old myocardial infarction: Secondary | ICD-10-CM | POA: Insufficient documentation

## 2014-04-08 DIAGNOSIS — Z510 Encounter for antineoplastic radiation therapy: Secondary | ICD-10-CM | POA: Diagnosis present

## 2014-04-08 DIAGNOSIS — Z7902 Long term (current) use of antithrombotics/antiplatelets: Secondary | ICD-10-CM | POA: Diagnosis not present

## 2014-04-08 DIAGNOSIS — I69998 Other sequelae following unspecified cerebrovascular disease: Secondary | ICD-10-CM | POA: Diagnosis not present

## 2014-04-08 DIAGNOSIS — Z87891 Personal history of nicotine dependence: Secondary | ICD-10-CM | POA: Diagnosis not present

## 2014-04-08 DIAGNOSIS — N529 Male erectile dysfunction, unspecified: Secondary | ICD-10-CM | POA: Diagnosis not present

## 2014-04-08 DIAGNOSIS — E785 Hyperlipidemia, unspecified: Secondary | ICD-10-CM | POA: Diagnosis not present

## 2014-04-08 DIAGNOSIS — Z951 Presence of aortocoronary bypass graft: Secondary | ICD-10-CM | POA: Diagnosis not present

## 2014-04-08 HISTORY — DX: Cerebral infarction, unspecified: I63.9

## 2014-04-08 HISTORY — DX: Zoster without complications: B02.9

## 2014-04-08 NOTE — Progress Notes (Signed)
Please see the Nurse Progress Note in the MD Initial Consult Encounter for this patient. 

## 2014-04-08 NOTE — Progress Notes (Signed)
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 male who is seen out courtesy of Dr. [name] for an opinion concerning radiation therapy as part of management of patient's recently diagnosed high-risk prostate cancer. Patient presented earlier this year with an elevated PSA of 27.2. This was discovered by Dr. Kelton Pillar in January at 2014 when the PSA was 17 however the patient did not followup with initial referral at that time. Patient proceeded to undergo digital examination by Dr Jeffie Pollock which revealed a 3+ prostate with a palpable nodule in the right apex. Patient proceeded to undergo transrectal ultrasound of the prostate. On ultrasound the prostate volume was 50 cubic centimeters. The patient was noted to have hypoechoic lesions within the prostate  gland and some distortion of the capsule suspicious for extracapsular extension. The seminal vesicles appeared normal in size. All 12 biopsies of the prostate gland showed adenocarcinoma with the bilateral base area showing Gleason's 8 prostate cancer in addition the bilateral apex area showed Gleason's 8. Bone scan as well as CT scan of the pelvis showed no evidence of metastasis.  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    PAST SURGICAL HISTORY: Past Surgical History  Procedure Laterality Date  . Cardiac surgery    . Coronary artery bypass graft  2009    LIMA to LAD, SVG to diagonal, SVG to Circ, SVG to distal RCA.  . Cardiac catheterization  03/07/2012    Three Promus DE stents to the SVG to RCA, PTCA atherectomy of RPDA lesion-Angiosculpt ahterectomy balloon  . 2d echocardiogram-limited  05/20/2012    Definity contrast-EF 45-50%,No LV apical thrombus  . 2d echocardiogram  04/28/2012    EF 30-35%, Diffuse hypokinesis, grade one diastolic dysfunction. Mild MR  . Renal artery dopplers  09/08/08    Normal renal duplex.  No diameter reduction.   . Prostate biopsy  03/01/14    FAMILY HISTORY: family history is not on file.  SOCIAL HISTORY:  reports that he quit smoking about 32 years ago. His smoking use included Cigarettes. He has a .75 pack-year smoking history. He has never used smokeless tobacco. He reports that he does not drink alcohol or use illicit drugs.  ALLERGIES: Review of patient's allergies indicates no known allergies.  MEDICATIONS:  Current Outpatient Prescriptions  Medication Sig Dispense Refill  . acetaminophen (TYLENOL) 325 MG tablet Take 650 mg by mouth every 4 (four) hours as needed. For pain/headache      . amLODipine (NORVASC) 10 MG tablet Take 10 mg by mouth daily.      Marland Kitchen aspirin EC 81 MG tablet Take 81 mg by mouth daily.      . carvedilol (COREG) 12.5  MG tablet Take 12.5 mg by mouth 3 (three) times daily after meals.      . cloNIDine (CATAPRES) 0.2 MG tablet Take 0.2 mg by mouth at bedtime.      . clopidogrel (PLAVIX) 75 MG tablet Take 75 mg by mouth daily with breakfast.      . lisinopril (PRINIVIL,ZESTRIL) 20 MG tablet Take 10 mg by mouth daily.       .  metFORMIN (GLUCOPHAGE) 850 MG tablet Take 1 tablet (850 mg total) by mouth 2 (two) times daily with a meal.  60 tablet  1  . simvastatin (ZOCOR) 80 MG tablet Take 80 mg by mouth every evening.       Marland Kitchen spironolactone (ALDACTONE) 25 MG tablet Take 1 tablet (25 mg total) by mouth daily.  30 tablet  1  . baclofen (LIORESAL) 10 MG tablet Take 1 tablet (10 mg total) by mouth 3 (three) times daily.  90 each  0  . furosemide (LASIX) 20 MG tablet Take 20 mg by mouth 2 (two) times daily.      Marland Kitchen glipiZIDE (GLUCOTROL) 10 MG tablet Take 10 mg by mouth daily.      . isosorbide mononitrate (IMDUR) 30 MG 24 hr tablet Take 30 mg by mouth daily.      . nitroGLYCERIN (NITROSTAT) 0.4 MG SL tablet Place 0.4 mg under the tongue every 5 (five) minutes as needed. For chest pain      . prasugrel (EFFIENT) 10 MG TABS Take by mouth.      . traMADol (ULTRAM) 50 MG tablet Take 50 mg by mouth every 12 (twelve) hours as needed.       No current facility-administered medications for this encounter.    REVIEW OF SYSTEMS:  A 15 point review of systems is documented in the electronic medical record. This was obtained by the nursing staff. However, I reviewed this with the patient to discuss relevant findings and make appropriate changes. The patient completed the international prostate symptom score with total score of 17 representing high moderate symptomatology. Patient does have erectile dysfunction is unable to maintain an erection long enough for intercourse. He denies any new bony pain. Patient has right-sided weakness from a prior CVA and ambulates with the assistance of a cane.   PHYSICAL EXAM:  height is 5\' 8"  (1.727 m) and weight is 176 lb (79.833 kg). His oral temperature is 97.6 F (36.4 C). His blood pressure is 173/100 and his pulse is 73.   BP 173/100  Pulse 73  Temp(Src) 97.6 F (36.4 C) (Oral)  Ht 5\' 8"  (1.727 m)  Wt 176 lb (79.833 kg)  BMI 26.77 kg/m2  General Appearance:    Alert, cooperative, no distress,  appears stated age, accompanied by wife and daughter on evaluation today   Head:    Normocephalic, without obvious abnormality, atraumatic  Eyes:    PERRL, conjunctiva/corneas clear, EOM's intact, fundi    benign, both eyes       Ears:    Normal TM's and external ear canals, both ears  Nose:   Nares normal, septum midline, mucosa normal, no drainage    or sinus tenderness  Throat:   Lips, mucosa, and tongue normal; teeth and gums normal  Neck:   Supple, symmetrical, trachea midline, no adenopathy;       thyroid:  No enlargement/tenderness/nodules; no carotid   bruit or JVD  Back:     Symmetric, no curvature, ROM normal, no CVA tenderness  Lungs:  Clear to auscultation bilaterally, respirations unlabored  Chest wall:    No tenderness or deformity  Heart:    Regular rate and rhythm, S1 and S2 normal, no murmur, rub   or gallop  Abdomen:     Soft, non-tender, bowel sounds active all four quadrants,    no masses, no organomegaly  Genitalia:    Normal male without lesion, discharge or tenderness, circumcised   Rectal:    Normal tone, prostate enlarged  2-3+, quite firm with palpation throughout. Palpable nodule is present in the right base    Extremities:   Extremities normal, atraumatic, no cyanosis or edema  Pulses:   2+ and symmetric all extremities  Skin:   Skin color, texture, turgor normal, no rashes or lesions  Lymph nodes:   Cervical, supraclavicular, and axillary nodes normal  Neurologic:    right-sided weakness from CVA, sensation and reflexes      throughout     ECOG = 1    1 - Symptomatic but completely ambulatory (Restricted in physically strenuous activity but ambulatory and able to carry out work of a light or sedentary nature. For example, light housework, office work)  LABORATORY DATA:  Lab Results  Component Value Date   WBC 8.5 05/02/2012   HGB 15.1 05/02/2012   HCT 43.7 05/02/2012   MCV 79.0 05/02/2012   PLT 172 05/02/2012   NEUTROABS 5.9 05/02/2012   Lab  Results  Component Value Date   NA 139 05/21/2012   K 3.6 05/21/2012   CL 104 05/21/2012   CO2 23 05/21/2012   GLUCOSE 110* 05/21/2012   CREATININE 1.18 05/21/2012   CALCIUM 10.3 05/21/2012      RADIOGRAPHY: Nm Bone Scan Whole Body  03/25/2014   CLINICAL DATA:  Prostate cancer.  EXAM: NUCLEAR MEDICINE WHOLE BODY BONE SCAN  TECHNIQUE: Whole body anterior and posterior images were obtained approximately 3 hours after intravenous injection of radiopharmaceutical.  RADIOPHARMACEUTICALS:  26.0 mCi Technetium-99 MDP  COMPARISON:  CT 03/25/2014.  FINDINGS: Bilateral renal function and excretion. Minimal increase activity noted about the cervical spine and left knee, most likely degenerative. Exam otherwise unremarkable. No evidence of multifocal uptake noted to suggest metastatic disease.  IMPRESSION: Findings suggesting mild degenerative change cervical spine and left knee. Exam otherwise unremarkable.   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 I would agree with Dr. Ralene Muskrat recommendations for androgen ablation and radiation therapy for local regional control. In light of the patient's history of coronary artery disease, diabetes mellitus as well as prior CVA, I would not recommend a seed boost for his radiation therapy treatment in light of the potential risks with general anesthesia. He would be a good candidate for 8 weeks of intensity modulated radiation therapy. I discussed the treatment course side effects and potential toxicities of radiation therapy in this situation with the patient and his family. Patient appears to and wishes to proceed with planned course of treatment.   PLAN: The patient will proceed with androgen ablation  for the next ~ 3 months. Once the patient's urinary  symptoms are improved he will proceed with  fiducial marker placement followed by  referral back to radiation oncology for planning and treatment.   I spent 60 minutes minutes face to face with the patient and more than 50% of that time was spent in counseling and/or  coordination of care.   ------------------------------------------------  Blair Promise, PhD, MD

## 2014-04-30 ENCOUNTER — Other Ambulatory Visit: Payer: Self-pay | Admitting: Urology

## 2014-04-30 DIAGNOSIS — Z79899 Other long term (current) drug therapy: Secondary | ICD-10-CM

## 2014-05-03 ENCOUNTER — Other Ambulatory Visit: Payer: Self-pay | Admitting: Urology

## 2014-05-03 DIAGNOSIS — Z79899 Other long term (current) drug therapy: Secondary | ICD-10-CM

## 2014-07-27 ENCOUNTER — Ambulatory Visit
Admission: RE | Admit: 2014-07-27 | Discharge: 2014-07-27 | Disposition: A | Discharge status: Home / Self Care | Payer: Medicare Other | Source: Ambulatory Visit | Attending: Urology | Admitting: Urology

## 2014-07-27 DIAGNOSIS — Z79899 Other long term (current) drug therapy: Secondary | ICD-10-CM

## 2014-08-19 ENCOUNTER — Encounter (HOSPITAL_COMMUNITY): Payer: Self-pay | Admitting: Cardiology

## 2014-11-05 NOTE — Progress Notes (Signed)
GU Location of Tumor / Histology: prostate cancer  If Prostate Cancer, Gleason Score is (4 + 4=8) and PSA is (27.2 from 4/15) and prostate volume of 50 ml.  [name] presented with an elevated PSA - 27.2 in May of 2015 up from 17 on 1/14.  PSA on 11/15 was 0.71.  Biopsies revealed:  03/01/2014   Past/Anticipated interventions by urology, if any: prostate biopsy on 03/01/14. Plan to start 2 years of androgen ablation. Started on frimagon.  Had a dose of Trelstar 22.5 mg on 8/15.  Past/Anticipated interventions by medical oncology, if any: none  Weight changes, if any: no  Bowel/Bladder complaints, if any:   Nausea/Vomiting, if any: no  Pain issues, if any:   SAFETY ISSUES:  Prior radiation? no  Pacemaker/ICD? no  Possible current pregnancy? no  Is the patient on methotrexate? no  Current Complaints / other details:

## 2014-11-09 ENCOUNTER — Telehealth: Payer: Self-pay | Admitting: *Deleted

## 2014-11-09 NOTE — Telephone Encounter (Signed)
Called patient to cancel tomorrow's appt. With Dr. Sondra Come, per Dr. Sondra Come request, informed of sim appt. On 12-01-14 @ 10 am , spoke with patient and he is aware of this appt.

## 2014-11-09 NOTE — Telephone Encounter (Signed)
Called patient to inform of gold seed placement on 11-29-14- arrival time - 2 pm @ Dr. Ralene Muskrat Office, spoke with patient and he is aware of this appt.

## 2014-11-10 ENCOUNTER — Ambulatory Visit: Payer: Medicare Other

## 2014-11-10 ENCOUNTER — Ambulatory Visit
Admission: RE | Admit: 2014-11-10 | Discharge: 2014-11-10 | Disposition: A | Discharge status: Home / Self Care | Payer: Medicare Other | Source: Ambulatory Visit | Attending: Radiation Oncology | Admitting: Radiation Oncology

## 2014-11-10 DIAGNOSIS — Z510 Encounter for antineoplastic radiation therapy: Secondary | ICD-10-CM | POA: Insufficient documentation

## 2014-11-10 DIAGNOSIS — C61 Malignant neoplasm of prostate: Secondary | ICD-10-CM | POA: Insufficient documentation

## 2014-12-01 ENCOUNTER — Ambulatory Visit
Admission: RE | Admit: 2014-12-01 | Discharge: 2014-12-01 | Disposition: A | Discharge status: Home / Self Care | Payer: Medicare Other | Source: Ambulatory Visit | Attending: Radiation Oncology | Admitting: Radiation Oncology

## 2014-12-01 DIAGNOSIS — Z510 Encounter for antineoplastic radiation therapy: Secondary | ICD-10-CM | POA: Diagnosis not present

## 2014-12-01 DIAGNOSIS — C61 Malignant neoplasm of prostate: Secondary | ICD-10-CM | POA: Diagnosis not present

## 2014-12-07 NOTE — Progress Notes (Signed)
  Radiation Oncology         [phone] ________________________________  Name: [name] MRN: [id number]  Date: 12/01/2014  DOB: Oct 29, 1945  SIMULATION AND TREATMENT PLANNING NOTE    ICD-9-CM ICD-10-CM   1. 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  Identity was confirmed.  All relevant records and images related to the planned course of therapy were reviewed.  The patient freely provided informed written consent to proceed with treatment after reviewing the details related to the planned course of therapy. The consent form was witnessed and verified by the simulation staff.  Then, the patient was set-up in a stable reproducible  supine position for radiation therapy.  CT images were obtained.  Surface markings were placed.  The CT images were loaded into the planning software.  Then the target and avoidance structures were contoured.  Treatment planning then occurred.  The radiation prescription was entered and confirmed.  Then, I designed and supervised the construction of a total of 1 medically necessary complex treatment devices.  I have requested : Intensity Modulated Radiotherapy (IMRT) is medically necessary for this case for the following reason:  Rectal sparing..  I have ordered:dose calc.  PLAN:  The patient will receive ~78 Gy in 40 fractions directed at the prostate gland. The nodal regions will receive 56 gray in light of the high risk disease and risk for lymph node metastasis  ________________________________  -----------------------------------  Blair Promise, PhD, MD

## 2014-12-08 DIAGNOSIS — Z510 Encounter for antineoplastic radiation therapy: Secondary | ICD-10-CM | POA: Diagnosis not present

## 2014-12-09 DIAGNOSIS — Z510 Encounter for antineoplastic radiation therapy: Secondary | ICD-10-CM | POA: Diagnosis not present

## 2014-12-13 ENCOUNTER — Ambulatory Visit
Admission: RE | Admit: 2014-12-13 | Discharge: 2014-12-13 | Disposition: A | Discharge status: Home / Self Care | Payer: Medicare Other | Source: Ambulatory Visit | Attending: Radiation Oncology | Admitting: Radiation Oncology

## 2014-12-13 DIAGNOSIS — C61 Malignant neoplasm of prostate: Secondary | ICD-10-CM

## 2014-12-13 DIAGNOSIS — Z510 Encounter for antineoplastic radiation therapy: Secondary | ICD-10-CM | POA: Diagnosis not present

## 2014-12-13 NOTE — Progress Notes (Signed)
Pt here for patient teaching.  Pt given Radiation and You booklet. Reviewed areas of pertinence such as diarrhea, fatigue, skin changes and urinary and bladder changes . Pt able to give teach back of to pat skin, use unscented/gentle soap, use baby wipes, have Imodium on hand and drink plenty of water,avoid applying anything to skin within 4 hours of treatment. Pt demonstrated understanding and verbalizes understanding of information given and will contact nursing with any questions or concerns.

## 2014-12-14 ENCOUNTER — Ambulatory Visit
Admission: RE | Admit: 2014-12-14 | Discharge: 2014-12-14 | Disposition: A | Discharge status: Home / Self Care | Payer: Medicare Other | Source: Ambulatory Visit | Attending: Radiation Oncology | Admitting: Radiation Oncology

## 2014-12-14 ENCOUNTER — Encounter: Payer: Self-pay | Admitting: Radiation Oncology

## 2014-12-14 VITALS — BP 161/94 | HR 87 | Temp 98.1°F | Resp 16 | Ht 68.0 in | Wt 188.0 lb

## 2014-12-14 DIAGNOSIS — C61 Malignant neoplasm of prostate: Secondary | ICD-10-CM

## 2014-12-14 DIAGNOSIS — Z510 Encounter for antineoplastic radiation therapy: Secondary | ICD-10-CM | POA: Diagnosis not present

## 2014-12-14 NOTE — Progress Notes (Signed)
[name] has completed 2 fractions.  He denies pain.  He reports urinary frequency.  He denies nocturia.  He does not have any other complaints today.  BP 161/94 mmHg  Pulse 87  Temp(Src) 98.1 F (36.7 C) (Oral)  Resp 16  Ht 5\' 8"  (1.727 m)  Wt 188 lb (85.276 kg)  BMI 28.59 kg/m2

## 2014-12-14 NOTE — Progress Notes (Signed)
  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 . . . . . . . The patient presents for routine under treatment assessment.                                   The patient is without complaint.                                 Set-up films were reviewed.                                 The chart was checked. Physical Findings. . .  height is 5\' 8"  (1.727 m) and weight is 188 lb (85.276 kg). His oral temperature is 98.1 F (36.7 C). His blood pressure is 161/94 and his pulse is 87. His respiration is 16. . Weight essentially stable.  No significant changes. The lungs are clear. The heart has a regular rhythm and rate. The abdomen is soft and nontender with normal bowel sounds. Impression . . . . . . . The patient is tolerating radiation. Plan . . . . . . . . . . . . Continue treatment as planned.  ________________________________   Blair Promise, PhD, MD

## 2014-12-15 ENCOUNTER — Ambulatory Visit
Admission: RE | Admit: 2014-12-15 | Discharge: 2014-12-15 | Disposition: A | Discharge status: Home / Self Care | Payer: Medicare Other | Source: Ambulatory Visit | Attending: Radiation Oncology | Admitting: Radiation Oncology

## 2014-12-15 DIAGNOSIS — Z510 Encounter for antineoplastic radiation therapy: Secondary | ICD-10-CM | POA: Diagnosis not present

## 2014-12-16 ENCOUNTER — Ambulatory Visit
Admission: RE | Admit: 2014-12-16 | Discharge: 2014-12-16 | Disposition: A | Discharge status: Home / Self Care | Payer: Medicare Other | Source: Ambulatory Visit | Attending: Radiation Oncology | Admitting: Radiation Oncology

## 2014-12-16 DIAGNOSIS — Z510 Encounter for antineoplastic radiation therapy: Secondary | ICD-10-CM | POA: Diagnosis not present

## 2014-12-17 ENCOUNTER — Ambulatory Visit
Admission: RE | Admit: 2014-12-17 | Discharge: 2014-12-17 | Disposition: A | Discharge status: Home / Self Care | Payer: Medicare Other | Source: Ambulatory Visit | Attending: Radiation Oncology | Admitting: Radiation Oncology

## 2014-12-17 DIAGNOSIS — Z510 Encounter for antineoplastic radiation therapy: Secondary | ICD-10-CM | POA: Diagnosis not present

## 2014-12-20 ENCOUNTER — Ambulatory Visit
Admission: RE | Admit: 2014-12-20 | Discharge: 2014-12-20 | Disposition: A | Discharge status: Home / Self Care | Payer: Medicare Other | Source: Ambulatory Visit | Attending: Radiation Oncology | Admitting: Radiation Oncology

## 2014-12-20 DIAGNOSIS — Z510 Encounter for antineoplastic radiation therapy: Secondary | ICD-10-CM | POA: Diagnosis not present

## 2014-12-21 ENCOUNTER — Ambulatory Visit
Admission: RE | Admit: 2014-12-21 | Discharge: 2014-12-21 | Disposition: A | Discharge status: Home / Self Care | Payer: Medicare Other | Source: Ambulatory Visit | Attending: Radiation Oncology | Admitting: Radiation Oncology

## 2014-12-21 ENCOUNTER — Encounter: Payer: Self-pay | Admitting: Radiation Oncology

## 2014-12-21 VITALS — BP 153/86 | HR 88 | Temp 97.7°F | Resp 12 | Ht 68.0 in | Wt 186.5 lb

## 2014-12-21 DIAGNOSIS — C61 Malignant neoplasm of prostate: Secondary | ICD-10-CM

## 2014-12-21 DIAGNOSIS — Z510 Encounter for antineoplastic radiation therapy: Secondary | ICD-10-CM | POA: Diagnosis not present

## 2014-12-21 NOTE — Progress Notes (Signed)
[name] has completed 7 fractions to his prostate.  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  He denies and increase in urinary frequency, dysuria and hematuria.  He continues to get up once per night to urinate.  He denies having diarrhea and skin irritation.  He reports more fatigue in the evenings.  BP 153/86 mmHg  Pulse 88  Temp(Src) 97.7 F (36.5 C) (Oral)  Resp 12  Ht 5\' 8"  (1.727 m)  Wt 186 lb 8 oz (84.596 kg)  BMI 28.36 kg/m2

## 2014-12-21 NOTE — Progress Notes (Signed)
  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 . . . . . . . The patient presents for routine under treatment assessment.                                   The patient is without complaint. He does note some mild fatigue late in the afternoon. No change in bowel habits or urinary symptoms.                                 Set-up films were reviewed.                                 The chart was checked. Physical Findings. . .  height is 5\' 8"  (1.727 m) and weight is 186 lb 8 oz (84.596 kg). His oral temperature is 97.7 F (36.5 C). His blood pressure is 153/86 and his pulse is 88. His respiration is 12. . Weight essentially stable.  No significant changes. Impression . . . . . . . The patient is tolerating radiation. Plan . . . . . . . . . . . . Continue treatment as planned.  ________________________________   Blair Promise, PhD, MD

## 2014-12-22 ENCOUNTER — Ambulatory Visit
Admission: RE | Admit: 2014-12-22 | Discharge: 2014-12-22 | Disposition: A | Discharge status: Home / Self Care | Payer: Medicare Other | Source: Ambulatory Visit | Attending: Radiation Oncology | Admitting: Radiation Oncology

## 2014-12-22 DIAGNOSIS — Z510 Encounter for antineoplastic radiation therapy: Secondary | ICD-10-CM | POA: Diagnosis not present

## 2014-12-23 ENCOUNTER — Ambulatory Visit
Admission: RE | Admit: 2014-12-23 | Discharge: 2014-12-23 | Disposition: A | Discharge status: Home / Self Care | Payer: Medicare Other | Source: Ambulatory Visit | Attending: Radiation Oncology | Admitting: Radiation Oncology

## 2014-12-23 DIAGNOSIS — Z510 Encounter for antineoplastic radiation therapy: Secondary | ICD-10-CM | POA: Diagnosis not present

## 2014-12-24 ENCOUNTER — Ambulatory Visit
Admission: RE | Admit: 2014-12-24 | Discharge: 2014-12-24 | Disposition: A | Discharge status: Home / Self Care | Payer: Medicare Other | Source: Ambulatory Visit | Attending: Radiation Oncology | Admitting: Radiation Oncology

## 2014-12-24 DIAGNOSIS — Z510 Encounter for antineoplastic radiation therapy: Secondary | ICD-10-CM | POA: Diagnosis not present

## 2014-12-27 ENCOUNTER — Ambulatory Visit
Admission: RE | Admit: 2014-12-27 | Discharge: 2014-12-27 | Disposition: A | Discharge status: Home / Self Care | Payer: Medicare Other | Source: Ambulatory Visit | Attending: Radiation Oncology | Admitting: Radiation Oncology

## 2014-12-27 DIAGNOSIS — Z510 Encounter for antineoplastic radiation therapy: Secondary | ICD-10-CM | POA: Diagnosis not present

## 2014-12-28 ENCOUNTER — Ambulatory Visit
Admission: RE | Admit: 2014-12-28 | Discharge: 2014-12-28 | Disposition: A | Discharge status: Home / Self Care | Payer: Medicare Other | Source: Ambulatory Visit | Attending: Radiation Oncology | Admitting: Radiation Oncology

## 2014-12-28 ENCOUNTER — Encounter: Payer: Self-pay | Admitting: Radiation Oncology

## 2014-12-28 VITALS — BP 149/85 | HR 98 | Temp 98.2°F | Resp 16 | Ht 68.0 in | Wt 185.4 lb

## 2014-12-28 DIAGNOSIS — Z510 Encounter for antineoplastic radiation therapy: Secondary | ICD-10-CM | POA: Diagnosis not present

## 2014-12-28 DIAGNOSIS — C61 Malignant neoplasm of prostate: Secondary | ICD-10-CM

## 2014-12-28 NOTE — Progress Notes (Signed)
  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 . . . . . . . The patient presents for routine under treatment assessment.                                   The patient is without complaint. No real symptoms yet                                 Set-up films were reviewed.                                 The chart was checked. Physical Findings. . .  height is 5\' 8"  (1.727 m) and weight is 185 lb 6.4 oz (84.097 kg). His oral temperature is 98.2 F (36.8 C). His blood pressure is 149/85 and his pulse is 98. His respiration is 16. . Weight essentially stable.  No significant changes. Impression . . . . . . . The patient is tolerating radiation. Plan . . . . . . . . . . . . Continue treatment as planned.  ________________________________   Blair Promise, PhD, MD

## 2014-12-28 NOTE — Progress Notes (Signed)
Mak Sneeringer has completed 12 fractions to his prostate.  He denies pain, dysuria, hematuria, nocturia and urinary frequency.  He also denies bowel issues and fatigue.  BP 149/85 mmHg  Pulse 98  Temp(Src) 98.2 F (36.8 C) (Oral)  Resp 16  Ht 5\' 8"  (1.727 m)  Wt 185 lb 6.4 oz (84.097 kg)  BMI 28.20 kg/m2

## 2014-12-29 ENCOUNTER — Ambulatory Visit
Admission: RE | Admit: 2014-12-29 | Discharge: 2014-12-29 | Disposition: A | Discharge status: Home / Self Care | Payer: Medicare Other | Source: Ambulatory Visit | Attending: Radiation Oncology | Admitting: Radiation Oncology

## 2014-12-29 DIAGNOSIS — Z510 Encounter for antineoplastic radiation therapy: Secondary | ICD-10-CM | POA: Diagnosis not present

## 2014-12-30 ENCOUNTER — Ambulatory Visit
Admission: RE | Admit: 2014-12-30 | Discharge: 2014-12-30 | Disposition: A | Discharge status: Home / Self Care | Payer: Medicare Other | Source: Ambulatory Visit | Attending: Radiation Oncology | Admitting: Radiation Oncology

## 2014-12-30 ENCOUNTER — Ambulatory Visit: Admission: RE | Admit: 2014-12-30 | Payer: Medicare Other | Source: Ambulatory Visit

## 2014-12-31 ENCOUNTER — Ambulatory Visit
Admission: RE | Admit: 2014-12-31 | Discharge: 2014-12-31 | Disposition: A | Discharge status: Home / Self Care | Payer: Medicare Other | Source: Ambulatory Visit | Attending: Radiation Oncology | Admitting: Radiation Oncology

## 2014-12-31 DIAGNOSIS — Z510 Encounter for antineoplastic radiation therapy: Secondary | ICD-10-CM | POA: Diagnosis not present

## 2015-01-03 ENCOUNTER — Ambulatory Visit
Admission: RE | Admit: 2015-01-03 | Discharge: 2015-01-03 | Disposition: A | Discharge status: Home / Self Care | Payer: Medicare Other | Source: Ambulatory Visit | Attending: Radiation Oncology | Admitting: Radiation Oncology

## 2015-01-03 DIAGNOSIS — Z510 Encounter for antineoplastic radiation therapy: Secondary | ICD-10-CM | POA: Diagnosis not present

## 2015-01-04 ENCOUNTER — Encounter: Payer: Self-pay | Admitting: Radiation Oncology

## 2015-01-04 ENCOUNTER — Ambulatory Visit
Admission: RE | Admit: 2015-01-04 | Discharge: 2015-01-04 | Disposition: A | Discharge status: Home / Self Care | Payer: Medicare Other | Source: Ambulatory Visit | Attending: Radiation Oncology | Admitting: Radiation Oncology

## 2015-01-04 VITALS — BP 144/83 | HR 69 | Temp 98.2°F | Resp 12 | Ht 68.0 in | Wt 185.3 lb

## 2015-01-04 DIAGNOSIS — Z510 Encounter for antineoplastic radiation therapy: Secondary | ICD-10-CM | POA: Diagnosis not present

## 2015-01-04 DIAGNOSIS — C61 Malignant neoplasm of prostate: Secondary | ICD-10-CM

## 2015-01-04 NOTE — Progress Notes (Signed)
  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 . . . . . . . The patient presents for routine under treatment assessment.                                   The patient is without complaint. He continues to have no side effects                                 Set-up films were reviewed.                                 The chart was checked. Physical Findings. . .  height is 5\' 8"  (1.727 m) and weight is 185 lb 4.8 oz (84.052 kg). His oral temperature is 98.2 F (36.8 C). His blood pressure is 144/83 and his pulse is 69. His respiration is 12. .  The lungs are clear. The heart has a regular rhythm and rate. The abdomen is soft and nontender with normal bowel sounds. Impression . . . . . . . The patient is tolerating radiation. Plan . . . . . . . . . . . . Continue treatment as planned.  ________________________________   Blair Promise, PhD, MD  k

## 2015-01-04 NOTE — Progress Notes (Signed)
[name] has completed 16 fractions to his prostate.  He denies pain, dysuria, hematuria and an increase in urinary frequency.  He reports getting up once per night to urinate. He reports fatigue in the late afternoon.  BP 144/83 mmHg  Pulse 69  Temp(Src) 98.2 F (36.8 C) (Oral)  Resp 12  Ht 5\' 8"  (1.727 m)  Wt 185 lb 4.8 oz (84.052 kg)  BMI 28.18 kg/m2

## 2015-01-05 ENCOUNTER — Ambulatory Visit
Admission: RE | Admit: 2015-01-05 | Discharge: 2015-01-05 | Disposition: A | Discharge status: Home / Self Care | Payer: Medicare Other | Source: Ambulatory Visit | Attending: Radiation Oncology | Admitting: Radiation Oncology

## 2015-01-05 DIAGNOSIS — Z510 Encounter for antineoplastic radiation therapy: Secondary | ICD-10-CM | POA: Diagnosis not present

## 2015-01-06 ENCOUNTER — Ambulatory Visit
Admission: RE | Admit: 2015-01-06 | Discharge: 2015-01-06 | Disposition: A | Discharge status: Home / Self Care | Payer: Medicare Other | Source: Ambulatory Visit | Attending: Radiation Oncology | Admitting: Radiation Oncology

## 2015-01-06 DIAGNOSIS — Z510 Encounter for antineoplastic radiation therapy: Secondary | ICD-10-CM | POA: Diagnosis not present

## 2015-01-07 ENCOUNTER — Ambulatory Visit
Admission: RE | Admit: 2015-01-07 | Discharge: 2015-01-07 | Disposition: A | Discharge status: Home / Self Care | Payer: Medicare Other | Source: Ambulatory Visit | Attending: Radiation Oncology | Admitting: Radiation Oncology

## 2015-01-07 DIAGNOSIS — Z510 Encounter for antineoplastic radiation therapy: Secondary | ICD-10-CM | POA: Diagnosis not present

## 2015-01-10 ENCOUNTER — Ambulatory Visit
Admission: RE | Admit: 2015-01-10 | Discharge: 2015-01-10 | Disposition: A | Discharge status: Home / Self Care | Payer: Medicare Other | Source: Ambulatory Visit | Attending: Radiation Oncology | Admitting: Radiation Oncology

## 2015-01-10 DIAGNOSIS — Z510 Encounter for antineoplastic radiation therapy: Secondary | ICD-10-CM | POA: Diagnosis not present

## 2015-01-11 ENCOUNTER — Ambulatory Visit
Admission: RE | Admit: 2015-01-11 | Discharge: 2015-01-11 | Disposition: A | Discharge status: Home / Self Care | Payer: Medicare Other | Source: Ambulatory Visit | Attending: Radiation Oncology | Admitting: Radiation Oncology

## 2015-01-11 DIAGNOSIS — Z510 Encounter for antineoplastic radiation therapy: Secondary | ICD-10-CM | POA: Diagnosis not present

## 2015-01-12 ENCOUNTER — Ambulatory Visit
Admission: RE | Admit: 2015-01-12 | Discharge: 2015-01-12 | Disposition: A | Discharge status: Home / Self Care | Payer: Medicare Other | Source: Ambulatory Visit | Attending: Radiation Oncology | Admitting: Radiation Oncology

## 2015-01-12 ENCOUNTER — Encounter: Payer: Self-pay | Admitting: Radiation Oncology

## 2015-01-12 VITALS — BP 143/92 | HR 78 | Temp 98.2°F | Resp 16 | Ht 68.0 in | Wt 183.7 lb

## 2015-01-12 DIAGNOSIS — C61 Malignant neoplasm of prostate: Secondary | ICD-10-CM

## 2015-01-12 DIAGNOSIS — Z510 Encounter for antineoplastic radiation therapy: Secondary | ICD-10-CM | POA: Diagnosis not present

## 2015-01-12 NOTE — Progress Notes (Signed)
[name] has completed 22 fractions to his prostate.  He denies pain.  He reports urinating more frequently.  He denies dysuria.  He reports seeing a small amount of blood on his underwear yesterday.  He thinks it came from his urine.  He reports getting up once per night to urinate.  He denies diarrhea and skin irritation.  He reports slight fatigue.  BP 143/92 mmHg  Pulse 78  Temp(Src) 98.2 F (36.8 C) (Oral)  Resp 16  Ht 5\' 8"  (1.727 m)  Wt 183 lb 11.2 oz (83.326 kg)  BMI 27.94 kg/m2

## 2015-01-12 NOTE — Progress Notes (Signed)
Weekly Management Note Current Dose:  42.9 Gy  Projected Dose: 78 Gy   Narrative:  The patient presents for routine under treatment assessment.  CBCT/MVCT images/Port film x-rays were reviewed.  The chart was checked. He has stable nocturia and no dysuria, no diarrhea. He had a small amount of blood on his underwear yesterday which has been happening since his biopsy.  Physical Findings: Pleasant male in no distress.  Impression:  The patient is tolerating radiation.  Plan:  Continue treatment as planned.  This document serves as a record of services personally performed by Thea Silversmith, MD. It was created on her behalf by Arlyce Harman, a trained medical scribe. The creation of this record is based on the scribe's personal observations and the provider's statements to them. This document has been checked and approved by the attending provider.

## 2015-01-13 ENCOUNTER — Ambulatory Visit
Admission: RE | Admit: 2015-01-13 | Discharge: 2015-01-13 | Disposition: A | Discharge status: Home / Self Care | Payer: Medicare Other | Source: Ambulatory Visit | Attending: Radiation Oncology | Admitting: Radiation Oncology

## 2015-01-13 DIAGNOSIS — Z510 Encounter for antineoplastic radiation therapy: Secondary | ICD-10-CM | POA: Diagnosis not present

## 2015-01-14 ENCOUNTER — Ambulatory Visit
Admission: RE | Admit: 2015-01-14 | Discharge: 2015-01-14 | Disposition: A | Discharge status: Home / Self Care | Payer: Medicare Other | Source: Ambulatory Visit | Attending: Radiation Oncology | Admitting: Radiation Oncology

## 2015-01-14 DIAGNOSIS — Z510 Encounter for antineoplastic radiation therapy: Secondary | ICD-10-CM | POA: Diagnosis not present

## 2015-01-17 ENCOUNTER — Ambulatory Visit
Admission: RE | Admit: 2015-01-17 | Discharge: 2015-01-17 | Disposition: A | Discharge status: Home / Self Care | Payer: Medicare Other | Source: Ambulatory Visit | Attending: Radiation Oncology | Admitting: Radiation Oncology

## 2015-01-17 DIAGNOSIS — Z510 Encounter for antineoplastic radiation therapy: Secondary | ICD-10-CM | POA: Diagnosis not present

## 2015-01-18 ENCOUNTER — Encounter: Payer: Self-pay | Admitting: Radiation Oncology

## 2015-01-18 ENCOUNTER — Ambulatory Visit
Admission: RE | Admit: 2015-01-18 | Discharge: 2015-01-18 | Disposition: A | Discharge status: Home / Self Care | Payer: Medicare Other | Source: Ambulatory Visit | Attending: Radiation Oncology | Admitting: Radiation Oncology

## 2015-01-18 VITALS — BP 157/87 | HR 81 | Temp 98.2°F | Resp 12 | Ht 68.0 in | Wt 184.2 lb

## 2015-01-18 DIAGNOSIS — C61 Malignant neoplasm of prostate: Secondary | ICD-10-CM

## 2015-01-18 DIAGNOSIS — Z510 Encounter for antineoplastic radiation therapy: Secondary | ICD-10-CM | POA: Diagnosis not present

## 2015-01-18 NOTE — Progress Notes (Signed)
[name] has completed 26 fractions to his prostate.  He denies pain, urinary frequency, dysuria, hematuria, diarrhea, skin irritation and fatigue.  He continues to get up once per night to urinate.  BP 157/87 mmHg  Pulse 81  Temp(Src) 98.2 F (36.8 C) (Oral)  Resp 12  Ht 5\' 8"  (1.727 m)  Wt 184 lb 3.2 oz (83.553 kg)  BMI 28.01 kg/m2

## 2015-01-18 NOTE — Progress Notes (Signed)
  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 . . . . . . . The patient presents for routine under treatment assessment.                                   The patient is without complaint.  No appreciable side effects with this treatment                             Set-up films were reviewed.                                 The chart was checked. Physical Findings. . .  height is 5\' 8"  (1.727 m) and weight is 184 lb 3.2 oz (83.553 kg). His oral temperature is 98.2 F (36.8 C). His blood pressure is 157/87 and his pulse is 81. His respiration is 12. . Weight essentially stable.  No significant changes.   the lungs are clear. The heart has regular rhythm and rate. The abdomen is soft and nontender with normal bowel sounds.  Impression . . . . . . . The patient is tolerating radiation. Plan . . . . . . . . . . . . Continue treatment as planned.  ________________________________   Blair Promise, PhD, MD

## 2015-01-19 ENCOUNTER — Ambulatory Visit
Admission: RE | Admit: 2015-01-19 | Discharge: 2015-01-19 | Disposition: A | Discharge status: Home / Self Care | Payer: Medicare Other | Source: Ambulatory Visit | Attending: Radiation Oncology | Admitting: Radiation Oncology

## 2015-01-19 DIAGNOSIS — Z510 Encounter for antineoplastic radiation therapy: Secondary | ICD-10-CM | POA: Diagnosis not present

## 2015-01-20 ENCOUNTER — Ambulatory Visit
Admission: RE | Admit: 2015-01-20 | Discharge: 2015-01-20 | Disposition: A | Discharge status: Home / Self Care | Payer: Medicare Other | Source: Ambulatory Visit | Attending: Radiation Oncology | Admitting: Radiation Oncology

## 2015-01-20 DIAGNOSIS — Z510 Encounter for antineoplastic radiation therapy: Secondary | ICD-10-CM | POA: Diagnosis not present

## 2015-01-21 ENCOUNTER — Ambulatory Visit
Admission: RE | Admit: 2015-01-21 | Discharge: 2015-01-21 | Disposition: A | Discharge status: Home / Self Care | Payer: Medicare Other | Source: Ambulatory Visit | Attending: Radiation Oncology | Admitting: Radiation Oncology

## 2015-01-21 DIAGNOSIS — Z510 Encounter for antineoplastic radiation therapy: Secondary | ICD-10-CM | POA: Diagnosis not present

## 2015-01-24 ENCOUNTER — Ambulatory Visit
Admission: RE | Admit: 2015-01-24 | Discharge: 2015-01-24 | Disposition: A | Discharge status: Home / Self Care | Payer: Medicare Other | Source: Ambulatory Visit | Attending: Radiation Oncology | Admitting: Radiation Oncology

## 2015-01-24 DIAGNOSIS — Z510 Encounter for antineoplastic radiation therapy: Secondary | ICD-10-CM | POA: Diagnosis not present

## 2015-01-25 ENCOUNTER — Encounter: Payer: Self-pay | Admitting: Radiation Oncology

## 2015-01-25 ENCOUNTER — Ambulatory Visit
Admission: RE | Admit: 2015-01-25 | Discharge: 2015-01-25 | Disposition: A | Discharge status: Home / Self Care | Payer: Medicare Other | Source: Ambulatory Visit | Attending: Radiation Oncology | Admitting: Radiation Oncology

## 2015-01-25 VITALS — BP 150/88 | HR 72 | Temp 98.5°F | Resp 16 | Ht 68.0 in | Wt 183.8 lb

## 2015-01-25 DIAGNOSIS — C61 Malignant neoplasm of prostate: Secondary | ICD-10-CM

## 2015-01-25 DIAGNOSIS — Z510 Encounter for antineoplastic radiation therapy: Secondary | ICD-10-CM | POA: Diagnosis not present

## 2015-01-25 NOTE — Progress Notes (Signed)
  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 . . . . . . . The patient presents for routine under treatment assessment.                                   The patient is without complaint. He continues to report no side effects                                 Set-up films were reviewed.                                 The chart was checked. Physical Findings. . .  height is 5\' 8"  (1.727 m) and weight is 183 lb 12.8 oz (83.371 kg). His oral temperature is 98.5 F (36.9 C). His blood pressure is 150/88 and his pulse is 72. His respiration is 16. . Weight essentially stable.  No significant changes.  The lungs are clear. The heart has a regular rhythm and rate. The abdomen is soft and nontender with normal bowel sounds Impression . . . . . . . The patient is tolerating radiation. Plan . . . . . . . . . . . . Continue treatment as planned.  ________________________________   Blair Promise, PhD, MD

## 2015-01-25 NOTE — Progress Notes (Signed)
[name] has completed 31 fractions to his prostate.  He denies pain.  He denies urinary urgency, dysuria and hematuria.  He reports getting up 1-2 times per night to urinate.  He denies having diarrhea and fatigue.  BP 150/88 mmHg  Pulse 72  Temp(Src) 98.5 F (36.9 C) (Oral)  Resp 16  Ht 5\' 8"  (1.727 m)  Wt 183 lb 12.8 oz (83.371 kg)  BMI 27.95 kg/m2

## 2015-01-26 ENCOUNTER — Ambulatory Visit
Admission: RE | Admit: 2015-01-26 | Discharge: 2015-01-26 | Disposition: A | Discharge status: Home / Self Care | Payer: Medicare Other | Source: Ambulatory Visit | Attending: Radiation Oncology | Admitting: Radiation Oncology

## 2015-01-26 DIAGNOSIS — Z510 Encounter for antineoplastic radiation therapy: Secondary | ICD-10-CM | POA: Diagnosis not present

## 2015-01-27 ENCOUNTER — Ambulatory Visit
Admission: RE | Admit: 2015-01-27 | Discharge: 2015-01-27 | Disposition: A | Discharge status: Home / Self Care | Payer: Medicare Other | Source: Ambulatory Visit | Attending: Radiation Oncology | Admitting: Radiation Oncology

## 2015-01-27 DIAGNOSIS — Z510 Encounter for antineoplastic radiation therapy: Secondary | ICD-10-CM | POA: Diagnosis not present

## 2015-01-28 ENCOUNTER — Ambulatory Visit
Admission: RE | Admit: 2015-01-28 | Discharge: 2015-01-28 | Disposition: A | Discharge status: Home / Self Care | Payer: Medicare Other | Source: Ambulatory Visit | Attending: Radiation Oncology | Admitting: Radiation Oncology

## 2015-01-28 DIAGNOSIS — Z510 Encounter for antineoplastic radiation therapy: Secondary | ICD-10-CM | POA: Diagnosis not present

## 2015-01-31 ENCOUNTER — Ambulatory Visit
Admission: RE | Admit: 2015-01-31 | Discharge: 2015-01-31 | Disposition: A | Discharge status: Home / Self Care | Payer: Medicare Other | Source: Ambulatory Visit | Attending: Radiation Oncology | Admitting: Radiation Oncology

## 2015-01-31 DIAGNOSIS — Z510 Encounter for antineoplastic radiation therapy: Secondary | ICD-10-CM | POA: Diagnosis not present

## 2015-02-01 ENCOUNTER — Encounter: Payer: Self-pay | Admitting: Radiation Oncology

## 2015-02-01 ENCOUNTER — Ambulatory Visit
Admission: RE | Admit: 2015-02-01 | Discharge: 2015-02-01 | Disposition: A | Discharge status: Home / Self Care | Payer: Medicare Other | Source: Ambulatory Visit | Attending: Radiation Oncology | Admitting: Radiation Oncology

## 2015-02-01 VITALS — BP 150/83 | HR 66 | Temp 98.5°F | Resp 16 | Ht 68.0 in | Wt 186.2 lb

## 2015-02-01 DIAGNOSIS — C61 Malignant neoplasm of prostate: Secondary | ICD-10-CM

## 2015-02-01 DIAGNOSIS — Z510 Encounter for antineoplastic radiation therapy: Secondary | ICD-10-CM | POA: Diagnosis not present

## 2015-02-01 NOTE — Progress Notes (Signed)
[name] has completed 26 fractions to his prostate.  He reports pain in his left hip from arthritis when standing.  He denies urinary frequency.  He reports getting up 2 times per night to urinate.  He denies hematuria, dysuria, bowel changes and fatigue.  BP 150/83 mmHg  Pulse 66  Temp(Src) 98.5 F (36.9 C) (Oral)  Resp 16  Ht 5\' 8"  (1.727 m)  Wt 186 lb 3.2 oz (84.46 kg)  BMI 28.32 kg/m2

## 2015-02-01 NOTE — Progress Notes (Signed)
  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 . . . . . . . The patient presents for routine under treatment assessment.                                   The patient is without complaint. He denies urinary frequency. He reports getting up 2 times per night to urinate. He denies hematuria, dysuria, bowel changes and fatigue                                 Set-up films were reviewed.                                 The chart was checked. Physical Findings. . .  height is 5\' 8"  (1.727 m) and weight is 186 lb 3.2 oz (84.46 kg). His oral temperature is 98.5 F (36.9 C). His blood pressure is 150/83 and his pulse is 66. His respiration is 16. . Weight essentially stable.  No significant changes. The lungs are clear. The heart has a regular rhythm and rate. The abdomen is soft and nontender with normal bowel sounds. Impression . . . . . . . The patient is tolerating radiation. Plan . . . . . . . . . . . . Continue treatment as planned.  ________________________________   Blair Promise, PhD, MD

## 2015-02-02 ENCOUNTER — Ambulatory Visit
Admission: RE | Admit: 2015-02-02 | Discharge: 2015-02-02 | Disposition: A | Discharge status: Home / Self Care | Payer: Medicare Other | Source: Ambulatory Visit | Attending: Radiation Oncology | Admitting: Radiation Oncology

## 2015-02-02 DIAGNOSIS — Z510 Encounter for antineoplastic radiation therapy: Secondary | ICD-10-CM | POA: Diagnosis not present

## 2015-02-03 ENCOUNTER — Ambulatory Visit
Admission: RE | Admit: 2015-02-03 | Discharge: 2015-02-03 | Disposition: A | Discharge status: Home / Self Care | Payer: Medicare Other | Source: Ambulatory Visit | Attending: Radiation Oncology | Admitting: Radiation Oncology

## 2015-02-03 DIAGNOSIS — Z510 Encounter for antineoplastic radiation therapy: Secondary | ICD-10-CM | POA: Diagnosis not present

## 2015-02-04 ENCOUNTER — Ambulatory Visit
Admission: RE | Admit: 2015-02-04 | Discharge: 2015-02-04 | Disposition: A | Discharge status: Home / Self Care | Payer: Medicare Other | Source: Ambulatory Visit | Attending: Radiation Oncology | Admitting: Radiation Oncology

## 2015-02-04 ENCOUNTER — Ambulatory Visit: Payer: Medicare Other

## 2015-02-04 DIAGNOSIS — Z510 Encounter for antineoplastic radiation therapy: Secondary | ICD-10-CM | POA: Diagnosis not present

## 2015-02-08 ENCOUNTER — Ambulatory Visit: Payer: Medicare Other

## 2015-02-08 ENCOUNTER — Encounter: Payer: Self-pay | Admitting: Radiation Oncology

## 2015-02-08 ENCOUNTER — Ambulatory Visit
Admission: RE | Admit: 2015-02-08 | Discharge: 2015-02-08 | Disposition: A | Discharge status: Home / Self Care | Payer: Medicare Other | Source: Ambulatory Visit | Attending: Radiation Oncology | Admitting: Radiation Oncology

## 2015-02-08 VITALS — BP 161/104 | HR 90 | Temp 98.0°F | Resp 20 | Ht 68.0 in | Wt 184.2 lb

## 2015-02-08 DIAGNOSIS — C61 Malignant neoplasm of prostate: Secondary | ICD-10-CM

## 2015-02-08 DIAGNOSIS — Z510 Encounter for antineoplastic radiation therapy: Secondary | ICD-10-CM | POA: Diagnosis not present

## 2015-02-08 DIAGNOSIS — IMO0001 Reserved for inherently not codable concepts without codable children: Secondary | ICD-10-CM

## 2015-02-08 HISTORY — DX: Reserved for inherently not codable concepts without codable children: IMO0001

## 2015-02-08 NOTE — Progress Notes (Signed)
  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 . . . . . . . The patient presents for routine under treatment assessment.                                   The patient is without complaint. . He denies pain, dysuria and urinary frequency. He is getting up 2 times per night to urinate. He denies diarrhea and skin irritation. He reports fatigue in the afternoons.                                 Set-up films were reviewed.                                 The chart was checked. Physical Findings. . .  height is 5\' 8"  (1.727 m) and weight is 184 lb 3.2 oz (83.553 kg). His oral temperature is 98 F (36.7 C). His blood pressure is 161/104 and his pulse is 90. His respiration is 20. . Weight essentially stable.  No significant changes. Impression . . . . . . . The patient is tolerating radiation. Plan . . . . . . . . . . . . Continue treatment as planned.  ________________________________   Blair Promise, PhD, MD

## 2015-02-08 NOTE — Progress Notes (Addendum)
[name] has completed treatment with 40 fractions to his prostate.  He denies pain, dysuria and urinary frequency.  He is getting up 2 times per night to urinate.  He denies diarrhea and skin irritation.  He reports fatigue in the afternoons.  His bp was elevated today at 161/104.  He has been given a one month follow up appointment card.  BP 161/104 mmHg  Pulse 90  Temp(Src) 98 F (36.7 C) (Oral)  Resp 20  Ht 5\' 8"  (1.727 m)  Wt 184 lb 3.2 oz (83.553 kg)  BMI 28.01 kg/m2

## 2015-02-09 ENCOUNTER — Ambulatory Visit: Payer: Medicare Other

## 2015-02-10 NOTE — Progress Notes (Signed)
  Radiation Oncology         (336) [phone] ________________________________  Name: RACE HIDER MRN: [id number]  Date: 02/08/2015  DOB: 05-30-1946  End of Treatment Note   ICD-9-CM ICD-10-CM    1. 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   He had minimal urinary symptoms and fatigue course of his treatment. He denied any significant bowel problems.  Plan: The patient has completed radiation treatment. The patient will return to radiation oncology clinic for routine followup in one month. I advised them to call or return sooner if they have any questions or concerns related to their recovery or treatment.  -----------------------------------  Blair Promise, PhD, MD

## 2015-02-16 ENCOUNTER — Ambulatory Visit
Admission: RE | Admit: 2015-02-16 | Discharge: 2015-02-16 | Disposition: A | Discharge status: Home / Self Care | Payer: Medicare Other | Source: Ambulatory Visit | Attending: Cardiology | Admitting: Cardiology

## 2015-02-16 ENCOUNTER — Other Ambulatory Visit: Payer: Self-pay | Admitting: Cardiology

## 2015-02-16 DIAGNOSIS — M25551 Pain in right hip: Secondary | ICD-10-CM

## 2015-03-15 ENCOUNTER — Encounter: Payer: Self-pay | Admitting: Oncology

## 2015-03-16 ENCOUNTER — Encounter: Payer: Self-pay | Admitting: Radiation Oncology

## 2015-03-16 ENCOUNTER — Ambulatory Visit
Admission: RE | Admit: 2015-03-16 | Discharge: 2015-03-16 | Disposition: A | Discharge status: Home / Self Care | Payer: Medicare Other | Source: Ambulatory Visit | Attending: Radiation Oncology | Admitting: Radiation Oncology

## 2015-03-16 VITALS — BP 130/90 | HR 79 | Temp 98.2°F | Resp 16 | Ht 68.0 in | Wt 186.9 lb

## 2015-03-16 DIAGNOSIS — C61 Malignant neoplasm of prostate: Secondary | ICD-10-CM

## 2015-03-16 NOTE — Progress Notes (Signed)
Radiation Oncology         ([phone] ________________________________  Name: [name] MRN: [id number]  Date: 03/16/2015  DOB: 09/28/1945  Follow-Up Visit Note  CC: Default, Provider, MD  No ref. provider found    ICD-9-CM ICD-10-CM   1. 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 [name] here for follow up after treatment to his prostate. He reports having arthritis pain in his lower back and upper legs. He is taking norco for this. He denies dysuria, hematuria, increase in urinary frequency and change in urinary stream. He reports nocturia x 2. He denies diarrhea, skin irritation and fatigue. He will be making an appointment with his urologist for September.  ALLERGIES:  has No Known Allergies.  Meds: Current Outpatient Prescriptions  Medication Sig Dispense Refill  . acetaminophen (TYLENOL) 325 MG tablet Take 650 mg by mouth every 4 (four) hours as needed. For pain/headache    . amLODipine (NORVASC) 10 MG tablet Take 10 mg by mouth daily.    Marland Kitchen aspirin EC 81 MG tablet Take 81 mg by mouth daily.    . carvedilol (COREG) 25 MG tablet   0  . cloNIDine (CATAPRES) 0.2 MG tablet Take 0.2 mg by mouth at bedtime.    . clopidogrel (PLAVIX) 75 MG tablet Take 75 mg by mouth daily with breakfast.    . furosemide (LASIX) 20 MG tablet Take 20 mg by mouth 2 (two) times daily.    Marland Kitchen glipiZIDE (GLUCOTROL) 10 MG tablet Take 10 mg by mouth daily.    Marland Kitchen HYDROcodone-acetaminophen (NORCO) 7.5-325 MG per tablet   0  . isosorbide mononitrate (IMDUR) 30 MG 24 hr tablet Take 30 mg by mouth daily.    Marland Kitchen JANUVIA 100 MG tablet   0  . lisinopril (PRINIVIL,ZESTRIL) 20 MG tablet Take 10 mg by mouth daily.     . metFORMIN (GLUCOPHAGE) 500 MG tablet   0  . simvastatin (ZOCOR) 80 MG  tablet Take 80 mg by mouth every evening.     . traMADol (ULTRAM) 50 MG tablet Take 50 mg by mouth every 12 (twelve) hours as needed.    . baclofen (LIORESAL) 10 MG tablet Take 1 tablet (10 mg total) by mouth 3 (three) times daily. (Patient not taking: Reported on 12/21/2014) 90 each 0  . nitroGLYCERIN (NITROSTAT) 0.4 MG SL tablet Place 0.4 mg under the tongue every 5 (five) minutes as needed. For chest pain    . spironolactone (ALDACTONE) 25 MG tablet Take 1 tablet (25 mg total) by mouth daily. (Patient not taking: Reported on 02/08/2015) 30 tablet 1   No current facility-administered medications for this encounter.    Physical Findings: The patient is in no acute distress. Patient is alert and oriented.  height is 5\' 8"  (1.727 m) and weight is 186 lb 14.4 oz (84.777 kg). His oral temperature is 98.2 F (36.8 C). His blood pressure is 130/90 and his pulse is 79. His respiration is 16. .  No significant changes. General: Well-developed, in no acute distress HEENT: Normocephalic, atraumatic Cardiovascular: Regular rate and rhythm Respiratory: Clear to auscultation bilaterally No palpable cervical, supraclavicular or axillary lymphoadenopathy   Lab Findings: Lab Results  Component Value Date   WBC 8.5 05/02/2012   HGB 15.1 05/02/2012  HCT 43.7 05/02/2012   MCV 79.0 05/02/2012   PLT 172 05/02/2012    Radiographic Findings: Dg Hip Unilat With Pelvis 2-3 Views Right  02/16/2015   CLINICAL DATA:  Right hip pain laterally and posteriorly for 3 weeks, no injury  EXAM: RIGHT HIP (WITH PELVIS) 2-3 VIEWS  COMPARISON:  None.  FINDINGS: There is mild degenerative joint disease of the hips with some loss of joint space and mild spurring. No acute fracture is seen. The pelvic rami are intact. The SI joints are corticated. There is some degenerative change at L5-S1.  IMPRESSION: 1. 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 No acute abnormality. 2. Degenerative change at L5-S1.   Electronically  Signed   By: Ivar Drape M.D.   On: 02/16/2015 12:07    Impression:  The patient is recovering well from the effects of radiation.    Plan:  Follow-up in 6 months.   -----------------------------------  Blair Promise, PhD, MD  This document serves as a record of services personally performed by Gery Pray, MD. It was created on his behalf by Derek Mound, a trained medical scribe. The creation of this record is based on the scribe's personal observations and the provider's statements to them. This document has been checked and approved by the attending provider.

## 2015-03-16 NOTE — Progress Notes (Signed)
[name] here for follow up after treatment to his prostate.  He reports having arthritis pain in his lower back and upper legs.  He is taking norco for this.  He denies dysuria, hematuria, increase in urinary frequency and change in urinary stream.  He reports nocturia x 2.  He denies diarrhea, skin irritation and fatigue.  He will be making an appointment with his urologist for September.  BP 130/90 mmHg  Pulse 79  Temp(Src) 98.2 F (36.8 C) (Oral)  Resp 16  Ht 5\' 8"  (1.727 m)  Wt 186 lb 14.4 oz (84.777 kg)  BMI 28.42 kg/m2

## 2015-09-15 ENCOUNTER — Encounter: Payer: Self-pay | Admitting: Radiation Oncology

## 2015-09-15 ENCOUNTER — Ambulatory Visit
Admission: RE | Admit: 2015-09-15 | Discharge: 2015-09-15 | Disposition: A | Discharge status: Home / Self Care | Payer: Medicare Other | Source: Ambulatory Visit | Attending: Radiation Oncology | Admitting: Radiation Oncology

## 2015-09-15 VITALS — BP 145/87 | HR 75 | Temp 97.7°F | Resp 18 | Ht 68.0 in | Wt 190.8 lb

## 2015-09-15 DIAGNOSIS — C61 Malignant neoplasm of prostate: Secondary | ICD-10-CM

## 2015-09-15 NOTE — Progress Notes (Signed)
Radiation Oncology         [phone] ________________________________  Name: [name] MRN: [id number]  Date: 09/15/2015  DOB: 08/06/1946    Follow-Up Visit Note  CC: Default, Provider, MD  [name], MD  No diagnosis found.  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 He denies having any pain, urinary frequency, dysuria, hematuria. He reports getting up 0-1 times per night to urinate. He denies having any bowel issues or fatigue. He resumed having Trelstar injections yesterday. Patient saw Dr. Jeffie Pollock yesterday. Patient has no complaints at this time. The patient reports his PSA less than 1.   ALLERGIES:  has No Known Allergies.  Meds: Current Outpatient Prescriptions  Medication Sig Dispense Refill  . acetaminophen (TYLENOL) 325 MG tablet Take 650 mg by mouth every 4 (four) hours as needed. For pain/headache    . amLODipine (NORVASC) 10 MG tablet Take 10 mg by mouth daily.    Marland Kitchen aspirin EC 81 MG tablet Take 81 mg by mouth daily.    . carvedilol (COREG) 25 MG tablet   0  . clopidogrel (PLAVIX) 75 MG tablet Take 75 mg by mouth daily with breakfast.    . metFORMIN (GLUCOPHAGE) 500 MG tablet   0  . spironolactone (ALDACTONE) 25 MG tablet Take 1 tablet (25 mg total) by mouth daily. 30 tablet 1  . valsartan (DIOVAN) 320 MG tablet Take 320 mg by mouth daily.    . baclofen (LIORESAL) 10 MG tablet Take 1 tablet (10 mg total) by mouth 3 (three) times daily. (Patient not taking: Reported on 12/21/2014) 90 each 0  . nitroGLYCERIN (NITROSTAT) 0.4 MG SL tablet Place 0.4 mg under the tongue every 5 (five) minutes as needed. For chest pain    . simvastatin (ZOCOR) 80 MG tablet Take 80 mg by mouth every evening.      No current facility-administered medications for this encounter.    Physical Findings: The patient  is in no acute distress. Patient is alert and oriented.  height is 5\' 8"  (1.727 m) and weight is 190 lb 12.8 oz (86.546 kg). His oral temperature is 97.7 F (36.5 C). His blood pressure is 145/87 and his pulse is 75. His respiration is 18. .  No significant changes. General: Well-developed, in no acute distress HEENT: Normocephalic, atraumatic Cardiovascular: Regular rate and rhythm Respiratory: Clear to auscultation bilaterally Adenopathy: No palpable cervical, supraclavicular or axillary lymphoadenopathy   Lab Findings: Lab Results  Component Value Date   WBC 8.5 05/02/2012   HGB 15.1 05/02/2012   HCT 43.7 05/02/2012   MCV 79.0 05/02/2012   PLT 172 05/02/2012    Radiographic Findings: No results found.  Impression:  The patient is recovering well from the effects of radiation.    Plan:  The patient will continue to follow closely with urology and will return to radiation oncology as needed. We have requested recent PSA levels from [facility].  -----------------------------------  Blair Promise, PhD, MD  This document serves as a record of services personally performed by Gery Pray, MD. It was created on his behalf by Arlyce Harman, a trained medical scribe. The creation of this record is based on the scribe's personal observations and the provider's statements to them. This document has been checked and approved by the attending provider.

## 2015-09-15 NOTE — Progress Notes (Signed)
[name] is here for follow up.  He denies having any pain, urinary frequency, dysuria, hematuria.  He reports getting up 0-1 times per night to urinate.  He denies having any bowel issues or fatigue.  He resumed having Trelstar injections yesterday.  BP 145/87 mmHg  Pulse 75  Temp(Src) 97.7 F (36.5 C) (Oral)  Resp 18  Ht 5\' 8"  (1.727 m)  Wt 190 lb 12.8 oz (86.546 kg)  BMI 29.02 kg/m2

## 2016-01-03 DIAGNOSIS — I10 Essential (primary) hypertension: Secondary | ICD-10-CM | POA: Diagnosis not present

## 2016-01-03 DIAGNOSIS — C61 Malignant neoplasm of prostate: Secondary | ICD-10-CM | POA: Diagnosis not present

## 2016-02-02 DIAGNOSIS — I69393 Ataxia following cerebral infarction: Secondary | ICD-10-CM | POA: Diagnosis not present

## 2016-02-02 DIAGNOSIS — Z7984 Long term (current) use of oral hypoglycemic drugs: Secondary | ICD-10-CM | POA: Diagnosis not present

## 2016-02-02 DIAGNOSIS — I25810 Atherosclerosis of coronary artery bypass graft(s) without angina pectoris: Secondary | ICD-10-CM | POA: Diagnosis not present

## 2016-02-02 DIAGNOSIS — E1121 Type 2 diabetes mellitus with diabetic nephropathy: Secondary | ICD-10-CM | POA: Diagnosis not present

## 2016-02-02 DIAGNOSIS — I10 Essential (primary) hypertension: Secondary | ICD-10-CM | POA: Diagnosis not present

## 2016-03-21 DIAGNOSIS — C61 Malignant neoplasm of prostate: Secondary | ICD-10-CM | POA: Diagnosis not present

## 2016-03-28 DIAGNOSIS — E291 Testicular hypofunction: Secondary | ICD-10-CM | POA: Diagnosis not present

## 2016-03-28 DIAGNOSIS — Z8546 Personal history of malignant neoplasm of prostate: Secondary | ICD-10-CM | POA: Diagnosis not present

## 2016-03-28 DIAGNOSIS — N5201 Erectile dysfunction due to arterial insufficiency: Secondary | ICD-10-CM | POA: Diagnosis not present

## 2016-05-08 DIAGNOSIS — C61 Malignant neoplasm of prostate: Secondary | ICD-10-CM | POA: Diagnosis not present

## 2016-05-08 DIAGNOSIS — I10 Essential (primary) hypertension: Secondary | ICD-10-CM | POA: Diagnosis not present

## 2016-05-10 DIAGNOSIS — E119 Type 2 diabetes mellitus without complications: Secondary | ICD-10-CM | POA: Diagnosis not present

## 2016-05-10 DIAGNOSIS — I2510 Atherosclerotic heart disease of native coronary artery without angina pectoris: Secondary | ICD-10-CM | POA: Diagnosis not present

## 2016-07-21 ENCOUNTER — Emergency Department (HOSPITAL_COMMUNITY): Payer: Medicare Other

## 2016-07-21 ENCOUNTER — Emergency Department (HOSPITAL_COMMUNITY)
Admission: EM | Admit: 2016-07-21 | Discharge: 2016-07-21 | Disposition: A | Discharge status: Home / Self Care | Payer: Medicare Other | Attending: Emergency Medicine | Admitting: Emergency Medicine

## 2016-07-21 ENCOUNTER — Encounter (HOSPITAL_COMMUNITY): Payer: Self-pay | Admitting: Adult Health

## 2016-07-21 DIAGNOSIS — Z8546 Personal history of malignant neoplasm of prostate: Secondary | ICD-10-CM | POA: Diagnosis not present

## 2016-07-21 DIAGNOSIS — M79601 Pain in right arm: Secondary | ICD-10-CM | POA: Diagnosis not present

## 2016-07-21 DIAGNOSIS — M4802 Spinal stenosis, cervical region: Secondary | ICD-10-CM | POA: Diagnosis not present

## 2016-07-21 DIAGNOSIS — Z8673 Personal history of transient ischemic attack (TIA), and cerebral infarction without residual deficits: Secondary | ICD-10-CM | POA: Insufficient documentation

## 2016-07-21 DIAGNOSIS — I2510 Atherosclerotic heart disease of native coronary artery without angina pectoris: Secondary | ICD-10-CM | POA: Insufficient documentation

## 2016-07-21 DIAGNOSIS — M25511 Pain in right shoulder: Secondary | ICD-10-CM | POA: Diagnosis not present

## 2016-07-21 DIAGNOSIS — Z951 Presence of aortocoronary bypass graft: Secondary | ICD-10-CM | POA: Diagnosis not present

## 2016-07-21 DIAGNOSIS — M25512 Pain in left shoulder: Secondary | ICD-10-CM | POA: Diagnosis not present

## 2016-07-21 DIAGNOSIS — E119 Type 2 diabetes mellitus without complications: Secondary | ICD-10-CM | POA: Diagnosis not present

## 2016-07-21 DIAGNOSIS — Z7984 Long term (current) use of oral hypoglycemic drugs: Secondary | ICD-10-CM | POA: Insufficient documentation

## 2016-07-21 DIAGNOSIS — R200 Anesthesia of skin: Secondary | ICD-10-CM | POA: Diagnosis not present

## 2016-07-21 DIAGNOSIS — Z87891 Personal history of nicotine dependence: Secondary | ICD-10-CM | POA: Diagnosis not present

## 2016-07-21 DIAGNOSIS — M5412 Radiculopathy, cervical region: Secondary | ICD-10-CM | POA: Diagnosis not present

## 2016-07-21 DIAGNOSIS — I10 Essential (primary) hypertension: Secondary | ICD-10-CM | POA: Insufficient documentation

## 2016-07-21 DIAGNOSIS — Z7982 Long term (current) use of aspirin: Secondary | ICD-10-CM | POA: Insufficient documentation

## 2016-07-21 DIAGNOSIS — I252 Old myocardial infarction: Secondary | ICD-10-CM | POA: Insufficient documentation

## 2016-07-21 DIAGNOSIS — M79602 Pain in left arm: Secondary | ICD-10-CM | POA: Diagnosis not present

## 2016-07-21 LAB — BASIC METABOLIC PANEL
Anion gap: 12 (ref 5–15)
BUN: 17 mg/dL (ref 6–20)
CO2: 23 mmol/L (ref 22–32)
Calcium: 10.3 mg/dL (ref 8.9–10.3)
Chloride: 103 mmol/L (ref 101–111)
Creatinine, Ser: 1.44 mg/dL — ABNORMAL HIGH (ref 0.61–1.24)
GFR calc Af Amer: 56 mL/min — ABNORMAL LOW (ref >60)
GFR, EST NON AFRICAN AMERICAN: 48 mL/min — AB (ref >60)
GLUCOSE: 203 mg/dL — AB (ref 65–99)
POTASSIUM: 3.7 mmol/L (ref 3.5–5.1)
SODIUM: 138 mmol/L (ref 135–145)

## 2016-07-21 LAB — URINALYSIS, ROUTINE W REFLEX MICROSCOPIC
Bilirubin Urine: NEGATIVE
Glucose, UA: 250 mg/dL — AB
Hgb urine dipstick: NEGATIVE
Ketones, ur: NEGATIVE mg/dL
LEUKOCYTES UA: NEGATIVE
Nitrite: NEGATIVE
PROTEIN: 30 mg/dL — AB
SPECIFIC GRAVITY, URINE: 1.011 (ref 1.005–1.030)
pH: 7 (ref 5.0–8.0)

## 2016-07-21 LAB — CBC
HEMATOCRIT: 40.9 % (ref 39.0–52.0)
Hemoglobin: 13.6 g/dL (ref 13.0–17.0)
MCH: 26.8 pg (ref 26.0–34.0)
MCHC: 33.3 g/dL (ref 30.0–36.0)
MCV: 80.7 fL (ref 78.0–100.0)
Platelets: 171 10*3/uL (ref 150–400)
RBC: 5.07 MIL/uL (ref 4.22–5.81)
RDW: 13.9 % (ref 11.5–15.5)
WBC: 6.3 10*3/uL (ref 4.0–10.5)

## 2016-07-21 LAB — I-STAT TROPONIN, ED
TROPONIN I, POC: 0.01 ng/mL (ref 0.00–0.08)
Troponin i, poc: 0.01 ng/mL (ref 0.00–0.08)

## 2016-07-21 LAB — URINE MICROSCOPIC-ADD ON
BACTERIA UA: NONE SEEN
RBC / HPF: NONE SEEN RBC/hpf (ref 0–5)
SQUAMOUS EPITHELIAL / LPF: NONE SEEN
WBC, UA: NONE SEEN WBC/hpf (ref 0–5)

## 2016-07-21 MED ORDER — PREDNISONE 20 MG PO TABS
60.0000 mg | ORAL_TABLET | Freq: Once | ORAL | Status: DC
  Filled 2016-07-21: qty 3

## 2016-07-21 MED ORDER — SODIUM CHLORIDE 0.9 % IV BOLUS (SEPSIS)
500.0000 mL | Freq: Once | INTRAVENOUS | Status: AC
  Administered 2016-07-21: 500 mL via INTRAVENOUS

## 2016-07-21 MED ORDER — DEXAMETHASONE SODIUM PHOSPHATE 10 MG/ML IJ SOLN
4.0000 mg | Freq: Once | INTRAMUSCULAR | Status: AC
  Administered 2016-07-21: 4 mg via INTRAVENOUS
  Filled 2016-07-21: qty 1

## 2016-07-21 NOTE — ED Notes (Signed)
Pt returned from MRI. Placed back on monitor.

## 2016-07-21 NOTE — ED Triage Notes (Addendum)
Presents with left shoulder pain began last week associated with arm numbness at times. This week the pain is also in right shoulderr with intermittent right arm numbness too. Endorses diarrhea that began last night, 3 episodes since last night-described as black and dark stool. Denies abdominal pain. Pt takes plavix.  Endorses SOB at times when right shoulder hurts.

## 2016-07-21 NOTE — ED Provider Notes (Signed)
[facility] DEPT Provider Note  CSN: [id number] Arrival date & time: 07/21/16  1417  History   Chief Complaint Chief Complaint  Patient presents with  . Shoulder Pain   HPI [name] is a 70 y.o. male.   Shoulder Pain   This is a new problem. The current episode started more than 1 week ago. The problem occurs daily. The problem has been gradually worsening. The pain is present in the right shoulder and left shoulder. The quality of the pain is described as sharp. The pain is at a severity of 6/10. The pain is moderate. Associated symptoms include numbness and tingling. There has been no history of extremity trauma.   Past Medical History:  Diagnosis Date  . Arthritis   . CAD (coronary artery disease), with CABG in 2009 after an MI 03/07/2012  . Coronary artery disease   . CVA (cerebral infarction)   . Diabetes mellitus   . DM (diabetes mellitus),poorly controlled 03/07/2012  . Dyslipidemia   . Herpes zoster   . Hypertension   . Hypertensive crisis 03/07/2012  . Myocardial infarction   . Radiation 02/08/15   prostate  . Unstable angina ([place]) 03/07/2012    Patient Active Problem List   Diagnosis Date Noted  . Malignant neoplasm of prostate ([place]) 04/08/2014  . 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 Spastic hemiplegia affecting dominant side ([place]) 07/04/2012  . Ischemic cardiomyopathy, EF 30-35% 2D 04/28/12 05/19/2012  . HTN (hypertension), poor control 05/19/2012  . CVA exstension 05/02/12 by CT 05/19/2012  . Diabetes mellitus ([place]) 05/19/2012  . Acute ischemic stroke. 04/28/12 04/28/2012  . Hypertensive emergency 04/28/2012  . Hyperglycemia 04/28/2012  . Dyslipidemia, (HDL 25) 03/10/2012  . NSTEMI -03/07/12 SVG-OM BMS with staged SVG-RCA DES and PDA PCI 03/10/12 03/08/2012  . DM (diabetes mellitus),poorly controlled 03/07/2012  . CAD (coronary artery disease), with CABG in 2009 after an MI 03/07/2012   Past Surgical History:  Procedure Laterality Date   . 2D Echocardiogram  04/28/2012   EF 30-35%, Diffuse hypokinesis, grade one diastolic dysfunction. Mild MR  . 2D Echocardiogram-Limited  05/20/2012   Definity contrast-EF 45-50%,No LV apical thrombus  . CARDIAC CATHETERIZATION  03/07/2012   Three Promus DE stents to the SVG to RCA, PTCA atherectomy of RPDA lesion-Angiosculpt ahterectomy balloon  . CARDIAC SURGERY    . CORONARY ARTERY BYPASS GRAFT  2009   LIMA to LAD, SVG to diagonal, SVG to Circ, SVG to distal RCA.  Marland Kitchen LEFT HEART CATHETERIZATION WITH CORONARY ANGIOGRAM N/A 03/07/2012   Procedure: LEFT HEART CATHETERIZATION WITH CORONARY ANGIOGRAM;  Surgeon: Leonie Man, MD;  Location: [facility] CATH LAB;  Service: Cardiovascular;  Laterality: N/A;  . PERCUTANEOUS CORONARY STENT INTERVENTION (PCI-S) Right 03/07/2012   Procedure: PERCUTANEOUS CORONARY STENT INTERVENTION (PCI-S);  Surgeon: Leonie Man, MD;  Location: [facility] CATH LAB;  Service: Cardiovascular;  Laterality: Right;  . PERCUTANEOUS CORONARY STENT INTERVENTION (PCI-S) N/A 03/10/2012   Procedure: PERCUTANEOUS CORONARY STENT INTERVENTION (PCI-S);  Surgeon: Leonie Man, MD;  Location: [facility] CATH LAB;  Service: Cardiovascular;  Laterality: N/A;  . PROSTATE BIOPSY  03/01/14  . Renal Artery Dopplers  09/08/08   Normal renal duplex.  No diameter reduction.      Home Medications    Prior to Admission medications   Medication Sig Start Date End Date Taking? Authorizing Provider  acetaminophen (TYLENOL) 325 MG tablet Take 650 mg by mouth every 4 (four) hours as needed. For pain/headache    Historical Provider, MD  amLODipine (NORVASC) 10 MG tablet Take 10 mg by mouth daily.    Historical Provider, MD  aspirin EC 81 MG tablet Take 81 mg by mouth daily.    Historical Provider, MD  baclofen (LIORESAL) 10 MG tablet Take 1 tablet (10 mg total) by mouth 3 (three) times daily. Patient not taking: Reported on 12/21/2014 08/25/12   Charlett Blake, MD  carvedilol (COREG) 25 MG tablet  01/13/15    Historical Provider, MD  clopidogrel (PLAVIX) 75 MG tablet Take 75 mg by mouth daily with breakfast.    Historical Provider, MD  metFORMIN (GLUCOPHAGE) 500 MG tablet  01/13/15   Historical Provider, MD  nitroGLYCERIN (NITROSTAT) 0.4 MG SL tablet Place 0.4 mg under the tongue every 5 (five) minutes as needed. For chest pain 03/11/12 01/25/15  Erlene Quan, PA-C  simvastatin (ZOCOR) 80 MG tablet Take 80 mg by mouth every evening.  03/11/12 03/16/15  Erlene Quan, PA-C  spironolactone (ALDACTONE) 25 MG tablet Take 1 tablet (25 mg total) by mouth daily. 05/21/12 09/15/15  Lavon Paganini Angiulli, PA-C  valsartan (DIOVAN) 320 MG tablet Take 320 mg by mouth daily.    Historical Provider, MD   Family History History reviewed. No pertinent family history.  Social History Social History  Substance Use Topics  . Smoking status: Former Smoker    Packs/day: 0.25    Years: 3.00    Types: Cigarettes    Quit date: 09/10/1981  . Smokeless tobacco: Never Used  . Alcohol use No   Allergies   Patient has no known allergies.  Review of Systems Review of Systems  Constitutional: Positive for diaphoresis. Negative for fever.  Respiratory: Negative for cough, chest tightness and shortness of breath.   Cardiovascular: Negative for chest pain.  Gastrointestinal: Negative for abdominal pain, blood in stool, diarrhea, nausea and vomiting.  Musculoskeletal: Negative for back pain, neck pain and neck stiffness.  Neurological: Positive for tingling and numbness.  All other systems reviewed and are negative.  Physical Exam Updated Vital Signs BP 157/94   Pulse 73   Temp 98 F (36.7 C) (Oral)   Resp 14   SpO2 100%   Physical Exam  Constitutional: He is oriented to person, place, and time. He appears well-developed and well-nourished. No distress.  HENT:  Head: Normocephalic.  Eyes: EOM are normal.  Cardiovascular: Normal rate.   Pulmonary/Chest: Effort normal and breath sounds normal. No respiratory distress. He has  no wheezes.  Abdominal: Soft. He exhibits no distension. There is no tenderness. There is no guarding.  Musculoskeletal: He exhibits no edema, tenderness or deformity.  No tenderness to palpation of shoulders. Limited ROM of right shoulder secondary to previous CVA.  5/5 left intrinsic hand, bicep flexion, tricep extension.  4/5 right intrinsic hand, bicep flexion, tricep extension  5/5 left plantar flexion, dorsiflexion and hip flexion  4/5 right plantar flexion, dorsiflexion and hip flexion.  Neurological: He is alert and oriented to person, place, and time. No cranial nerve deficit.  Skin: Skin is warm. Capillary refill takes less than 2 seconds. He is not diaphoretic.  Psychiatric: His behavior is normal.  Nursing note and vitals reviewed.    ED Treatments / Results  Labs (all labs ordered are listed, but only abnormal results are displayed) Labs Reviewed  BASIC METABOLIC PANEL - Abnormal; Notable for the following:       Result Value   Glucose, Bld 203 (*)    Creatinine, Ser 1.44 (*)    GFR calc  non Af Amer 48 (*)    GFR calc Af Amer 56 (*)    All other components within normal limits  URINALYSIS, ROUTINE W REFLEX MICROSCOPIC (NOT AT [facility]) - Abnormal; Notable for the following:    Glucose, UA 250 (*)    Protein, ur 30 (*)    All other components within normal limits  CBC  URINE MICROSCOPIC-ADD ON  Randolm Idol, ED  I-STAT TROPOININ, ED    EKG  EKG Interpretation  Date/Time:  Saturday July 21 2016 14:29:17 EST Ventricular Rate:  81 PR Interval:  172 QRS Duration: 94 QT Interval:  378 QTC Calculation: 439 R Axis:   -57 Text Interpretation:  Normal sinus rhythm Possible Left atrial enlargement Left axis deviation Inferior-posterior infarct , age undetermined Anterolateral infarct , age undetermined Abnormal ECG No significant change since last tracing Confirmed by YAO  MD, DAVID ([id number]) on 07/21/2016 3:06:54 PM       Radiology No results  found.  Procedures Procedures (including critical care time)  Medications Ordered in ED Medications - No data to display   Initial Impression / Assessment and Plan / ED Course  I have reviewed the triage vital signs and the nursing notes.  Pertinent labs & imaging results that were available during my care of the patient were reviewed by me and considered in my medical decision making (see chart for details).  Clinical Course    Patient is a pleasant 70 year old male who presents emergency department today with right and left shoulder pain for 1 week.   Patient endorses alternating right and left shoulder pain and he points to his paraspinal region and states that the pain and numbness radiating all the way down his arm. He states that has been progressively worsening but is not associated with movement or exertion. He does state that whenever he has this numbness that if he makes quick movements,  both arms become numb. These episodes are transient.  He denies any shortness of breath, nausea, diaphoresis with these associated symptoms and is nonexertional. At this time I do not believe is to be secondary to ACS. Patient states "this is nothing like my previous MI."  Patient appears to have possible radiculopathy versus central cordlike syndrome given his primary arm numbness and weakness.  EKG is unremarkable for significant changes from baseline. Troponin is negative. Given past medical history was obtained US troponins. At this time believe cervical MRI is warranted especially given his PMH of prostate cancer.  Currently on plavix  MRI of cervical region shows some stenosis but no additional acute findings.   Given history of coronary artery disease, delta troponins were obtained and negative. Patient will follow-up with neurosurgery regarding his intermittent and bilateral arm numbness.  Final Clinical Impressions(s) / ED Diagnoses   Final diagnoses:  Spinal stenosis of  cervical region  Acute pain of both shoulders  Cervical radiculopathy      Roberto Scales, MD 07/22/16 0112    Drenda Freeze, MD 07/22/16 1556

## 2016-08-15 DIAGNOSIS — Z23 Encounter for immunization: Secondary | ICD-10-CM | POA: Diagnosis not present

## 2016-08-15 DIAGNOSIS — C61 Malignant neoplasm of prostate: Secondary | ICD-10-CM | POA: Diagnosis not present

## 2016-08-15 DIAGNOSIS — I10 Essential (primary) hypertension: Secondary | ICD-10-CM | POA: Diagnosis not present

## 2016-08-23 DIAGNOSIS — G4730 Sleep apnea, unspecified: Secondary | ICD-10-CM | POA: Diagnosis not present

## 2016-09-17 DIAGNOSIS — M4802 Spinal stenosis, cervical region: Secondary | ICD-10-CM | POA: Diagnosis not present

## 2016-09-17 DIAGNOSIS — M4722 Other spondylosis with radiculopathy, cervical region: Secondary | ICD-10-CM | POA: Diagnosis not present

## 2016-09-17 DIAGNOSIS — Z6830 Body mass index (BMI) 30.0-30.9, adult: Secondary | ICD-10-CM | POA: Diagnosis not present

## 2016-09-17 DIAGNOSIS — I10 Essential (primary) hypertension: Secondary | ICD-10-CM | POA: Diagnosis not present

## 2016-09-25 ENCOUNTER — Ambulatory Visit: Payer: Medicare Other

## 2016-10-31 DIAGNOSIS — E119 Type 2 diabetes mellitus without complications: Secondary | ICD-10-CM | POA: Diagnosis not present

## 2016-10-31 DIAGNOSIS — I10 Essential (primary) hypertension: Secondary | ICD-10-CM | POA: Diagnosis not present

## 2016-12-09 DIAGNOSIS — M4800 Spinal stenosis, site unspecified: Secondary | ICD-10-CM

## 2016-12-09 HISTORY — DX: Spinal stenosis, site unspecified: M48.00

## 2017-02-28 ENCOUNTER — Ambulatory Visit (INDEPENDENT_AMBULATORY_CARE_PROVIDER_SITE_OTHER): Payer: Medicare Other | Admitting: Physician Assistant

## 2017-02-28 VITALS — BP 130/88 | HR 81 | Temp 98.0°F | Resp 18 | Ht 69.0 in | Wt 198.6 lb

## 2017-02-28 DIAGNOSIS — Z0000 Encounter for general adult medical examination without abnormal findings: Secondary | ICD-10-CM

## 2017-02-28 DIAGNOSIS — I639 Cerebral infarction, unspecified: Secondary | ICD-10-CM

## 2017-02-28 DIAGNOSIS — I255 Ischemic cardiomyopathy: Secondary | ICD-10-CM | POA: Diagnosis not present

## 2017-02-28 DIAGNOSIS — E785 Hyperlipidemia, unspecified: Secondary | ICD-10-CM

## 2017-02-28 DIAGNOSIS — I214 Non-ST elevation (NSTEMI) myocardial infarction: Secondary | ICD-10-CM

## 2017-02-28 DIAGNOSIS — I10 Essential (primary) hypertension: Secondary | ICD-10-CM

## 2017-02-28 DIAGNOSIS — E119 Type 2 diabetes mellitus without complications: Secondary | ICD-10-CM | POA: Diagnosis not present

## 2017-02-28 DIAGNOSIS — I2510 Atherosclerotic heart disease of native coronary artery without angina pectoris: Secondary | ICD-10-CM

## 2017-02-28 DIAGNOSIS — C61 Malignant neoplasm of prostate: Secondary | ICD-10-CM

## 2017-02-28 NOTE — Progress Notes (Signed)
Patient ID: [name] MRN: [id number], DOB: 03-Jun-1946 71 y.o. Date of Encounter: 02/28/2017, 3:29 PM    Chief Complaint: Physical (CPE)  HPI: 71 y.o. y/o male here for CPE.   He is here as a new patient to establish care. He says that he actually saw me remotely back sometime prior to 2011. Also I do see his wife as the patient. She accompanies him for visit today.  He states that he was seeing Dr. Montez Morita until he retired in May. States that for the past 2-3 years he was seeing just Dr. Montez Morita no other specialist etc. Says that he was seeing Dr. Montez Morita for internal medicine as well as cardiology.  Reviewed his cardiac  history with him and discussed that we'll need to refer him to get set up with a new cardiologist and he is agreeable.  He has no specific concerns to address today. Has been feeling well and feels that everything is stable/controlled.  Review of Systems: Consitutional: No fever, chills, fatigue, night sweats, lymphadenopathy, or weight changes. Eyes: No visual changes, eye redness, or discharge. ENT/Mouth: Ears: No otalgia, tinnitus, hearing loss, discharge. Nose: No congestion, rhinorrhea, sinus pain, or epistaxis. Throat: No sore throat, post nasal drip, or teeth pain. Cardiovascular: No CP, palpitations, diaphoresis, DOE, edema, orthopnea, PND. Respiratory: No cough, hemoptysis, SOB, or wheezing. Gastrointestinal: No anorexia, dysphagia, reflux, pain, nausea, vomiting, hematemesis, diarrhea, constipation, BRBPR, or melena. Genitourinary: No dysuria, frequency, urgency, hematuria, incontinence, nocturia, decreased urinary stream, discharge, impotence, or testicular pain/masses. Musculoskeletal: No decreased ROM, myalgias, stiffness, joint swelling, or weakness. Skin: No rash, erythema, lesion changes, pain, warmth, jaundice, or pruritis. Neurological: No headache, dizziness, syncope, seizures, tremors, memory loss, coordination problems, or  paresthesias. Psychological: No anxiety, depression, hallucinations, SI/HI. Endocrine: No fatigue, polydipsia, polyphagia, polyuria, or known diabetes. All other systems were reviewed and are otherwise negative.  Past Medical History:  Diagnosis Date  . Arthritis   . CAD (coronary artery disease), with CABG in 2009 after an MI 03/07/2012  . Coronary artery disease   . CVA (cerebral infarction)   . Diabetes mellitus   . DM (diabetes mellitus),poorly controlled 03/07/2012  . Dyslipidemia   . Herpes zoster   . Hypertension   . Hypertensive crisis 03/07/2012  . Myocardial infarction ([place])   . Radiation 02/08/15   prostate  . Spinal stenosis 12/2016  . Unstable angina ([place]) 03/07/2012     Past Surgical History:  Procedure Laterality Date  . 2D Echocardiogram  04/28/2012   EF 30-35%, Diffuse hypokinesis, grade one diastolic dysfunction. Mild MR  . 2D Echocardiogram-Limited  05/20/2012   Definity contrast-EF 45-50%,No LV apical thrombus  . CARDIAC CATHETERIZATION  03/07/2012   Three Promus DE stents to the SVG to RCA, PTCA atherectomy of RPDA lesion-Angiosculpt ahterectomy balloon  . CARDIAC SURGERY    . CORONARY ARTERY BYPASS GRAFT  2009   LIMA to LAD, SVG to diagonal, SVG to Circ, SVG to distal RCA.  Marland Kitchen LEFT HEART CATHETERIZATION WITH CORONARY ANGIOGRAM N/A 03/07/2012   Procedure: LEFT HEART CATHETERIZATION WITH CORONARY ANGIOGRAM;  Surgeon: Leonie Man, MD;  Location: [facility] CATH LAB;  Service: Cardiovascular;  Laterality: N/A;  . PERCUTANEOUS CORONARY STENT INTERVENTION (PCI-S) Right 03/07/2012   Procedure: PERCUTANEOUS CORONARY STENT INTERVENTION (PCI-S);  Surgeon: Leonie Man, MD;  Location: [facility] CATH LAB;  Service: Cardiovascular;  Laterality: Right;  . PERCUTANEOUS CORONARY STENT INTERVENTION (PCI-S) N/A 03/10/2012   Procedure: PERCUTANEOUS CORONARY STENT INTERVENTION (PCI-S);  Surgeon: Leonie Man,  MD;  Location: [facility] CATH LAB;  Service: Cardiovascular;  Laterality: N/A;  . PROSTATE  BIOPSY  03/01/14  . Renal Artery Dopplers  09/08/08   Normal renal duplex.  No diameter reduction.     Home Meds:  Outpatient Medications Prior to Visit  Medication Sig Dispense Refill  . acetaminophen (TYLENOL) 325 MG tablet Take 650 mg by mouth every 4 (four) hours as needed. For pain/headache    . amLODipine (NORVASC) 10 MG tablet Take 10 mg by mouth daily.    Marland Kitchen aspirin EC 81 MG tablet Take 81 mg by mouth daily.    . carvedilol (COREG) 25 MG tablet Take 25 mg by mouth daily.   0  . clopidogrel (PLAVIX) 75 MG tablet Take 75 mg by mouth daily with breakfast.    . simvastatin (ZOCOR) 80 MG tablet Take 80 mg by mouth every evening.    Marland Kitchen spironolactone (ALDACTONE) 25 MG tablet Take 25 mg by mouth daily.    . valsartan (DIOVAN) 320 MG tablet Take 320 mg by mouth daily.    . nitroGLYCERIN (NITROSTAT) 0.4 MG SL tablet Place 0.4 mg under the tongue every 5 (five) minutes as needed. For chest pain    . simvastatin (ZOCOR) 80 MG tablet Take 80 mg by mouth every evening.     Marland Kitchen spironolactone (ALDACTONE) 25 MG tablet Take 1 tablet (25 mg total) by mouth daily. 30 tablet 1  . baclofen (LIORESAL) 10 MG tablet Take 1 tablet (10 mg total) by mouth 3 (three) times daily. (Patient not taking: Reported on 12/21/2014) 90 each 0  . metFORMIN (GLUCOPHAGE) 500 MG tablet   0  . metFORMIN (GLUCOPHAGE) 850 MG tablet Take 850 mg by mouth 2 (two) times daily with a meal.     No facility-administered medications prior to visit.     Allergies: No Known Allergies  Social History   Social History  . Marital status: Married    Spouse name: N/A  . Number of children: 2  . Years of education: N/A   Occupational History  . retired    Social History Main Topics  . Smoking status: Former Smoker    Packs/day: 0.25    Years: 3.00    Types: Cigarettes    Quit date: 09/10/1981  . Smokeless tobacco: Never Used  . Alcohol use No  . Drug use: No  . Sexual activity: Not on file   Other Topics Concern  . Not on  file   Social History Narrative  . No narrative on file    No family history on file.  Physical Exam: Blood pressure 130/88, pulse 81, temperature 98 F (36.7 C), temperature source Oral, resp. rate 18, height 5\' 9"  (1.753 m), weight 198 lb 9.6 oz (90.1 kg), SpO2 98 %.  General: Well developed, well nourished AAM. Appears in no acute distress. HEENT: Normocephalic, atraumatic. Conjunctiva pink, sclera non-icteric. Pupils 2 mm constricting to 1 mm, round, regular, and equally reactive to light and accomodation. EOMI. Internal auditory canal clear. TMs with good cone of light and without pathology. Nasal mucosa pink. Nares are without discharge. No sinus tenderness. Oral mucosa pink. Neck: Supple. Trachea midline. No thyromegaly. Full ROM. No lymphadenopathy. No carotid bruits. Lungs: Clear to auscultation bilaterally without wheezes, rales, or rhonchi. Breathing is of normal effort and unlabored. Cardiovascular: RRR with S1 S2. No murmurs, rubs, or gallops.  Abdomen: Soft, non-tender, non-distended with normoactive bowel sounds. No hepatosplenomegaly or masses. No rebound/guarding. No CVA tenderness. No hernias. Musculoskeletal:  Full range of motion and 5/5 strength throughout.  Skin: Warm and moist without erythema, ecchymosis, wounds, or rash. Neuro: A+Ox3. CN II-XII grossly intact. Moves all extremities spontaneously. Full sensation throughout. Normal gait.  Psych:  Responds to questions appropriately with a normal affect.   Assessment/Plan:  71 y.o. y/o  male here for CPE  -1. Encounter for medical examination to establish care 2. Encounter for preventive health examination 3. Medicare annual wellness visit, subsequent  A. Screening Labs: - CBC with Differential/Platelet - COMPLETE METABOLIC PANEL WITH GFR - Lipid panel - TSH   B. Screening For Prostate Cancer: --He has h/o Prostate Cancer---see specialists for this  [place]:  He reports he had one  colonsocpoy but it was very long time ago--- ~ 20 years ago.  Discussed recommendations, risks/benefits--he is agreeable to see GI for another colonoscopy - Ambulatory referral to Gastroenterology  D. Immunizations: Flu--------------------N/A Tetanus--------------Says he had Tetanus Vaccine and Pneumonia Vaccine at Opp to Dr. Curlene Dolphin get records Pneumococcal Zostaax    4. Coronary artery disease involving native coronary artery of native heart without angina pectoris - Ambulatory referral to Cardiology  5. NSTEMI -03/07/12 SVG-OM BMS with staged SVG-RCA DES and PDA PCI 03/10/12 - Ambulatory referral to Cardiology  6. Ischemic cardiomyopathy, EF 30-35% 2D 04/28/12 - Ambulatory referral to Cardiology  7. Essential hypertension BP at goal. Cont current meds. Check lab to monitor. - COMPLETE METABOLIC PANEL WITH GFR - Ambulatory referral to Cardiology  8. H/O Acute ischemic stroke. 04/28/12   9. Type 2 diabetes mellitus without complication, without long-term current use of insulin (HCC) Check labs to monitor.  Will have f/u OV 3 months--- Will discuss foot care, eye exam, etc at that [place] GFR - Hemoglobin A1c - Microalbumin, urine - Ambulatory referral to Cardiology  10. Malignant neoplasm of prostate ([place]) --managed by specialist  11. Dyslipidemia, (HDL 25) Fasting. Check labs now. - COMPLETE METABOLIC PANEL WITH GFR - Lipid panel - Ambulatory referral to Cardiology    Subjective:   Patient presents for Medicare Annual/Subsequent preventive examination.   Review Past Medical/Family/Social: Reviewed today  Risk Factors  Current exercise habits: will furhter discuss at next ov Dietary issues discussed: cardiac, diabetic diet  Cardiac risk factors: known CAD  Depression Screen  (Note: if answer to either of the following is "Yes", a more complete depression screening is indicated)  Over the past two weeks, have you felt  down, depressed or hopeless? No Over the past two weeks, have you felt little interest or pleasure in doing things? No Have you lost interest or pleasure in daily life? No Do you often feel hopeless? No Do you cry easily over simple problems? No   Activities of Daily Living  In your present state of health, do you have any difficulty performing the following activities?:  Driving? No  Managing money? No  Feeding yourself? No  Getting from bed to chair? No  Climbing a flight of stairs? No  Preparing food and eating?: No  Bathing or showering? No  Getting dressed: No  Getting to the toilet? No  Using the toilet:No  Moving around from place to place: No  In the past year have you fallen or had a near fall?:No  Are you sexually active? No  Do you have more than one partner? No   Hearing Difficulties: No  Do you often ask people to speak up or repeat themselves? No  Do you experience ringing  or noises in your ears? No Do you have difficulty understanding soft or whispered voices? No  Do you feel that you have a problem with memory? No Do you often misplace items? No  Do you feel safe at home? Yes  Cognitive Testing  Alert? Yes Normal Appearance?Yes  Oriented to person? Yes Place? Yes  Time? Yes  Recall of three objects? Yes  Can perform simple calculations? Yes  Displays appropriate judgment?Yes  Can read the correct time from a watch face?Yes   List the Names of Other Physician/Practitioners you currently use:  Referral to cardiology and GI today  Indicate any recent Medical Services you may have received from other than [facility] providers in the past year (date may be approximate).   Screening Tests / Date--see above Colonoscopy                     Zostavax  Mammogram  Influenza Vaccine  Tetanus/tdap    Assessment:    Annual wellness medicare exam   Plan:    During the course of the visit the patient was educated and counseled about appropriate screening and  preventive services including:  Screening mammography  Colorectal cancer screening  Shingles vaccine. Prescription given to that she can get the vaccine at the pharmacy or Medicare part D.  Screen + for depression. PHQ- 9 score of 12 (moderate depression). We discussed the options of counseling versus possibly a medication. I encouraged her strongly think about the counseling. She is going through some medical problems currently and her husband is as well Mrs. been very stressful for her. She says she will think about it. She does have Xanax to use as needed. Though she may benefit from an SSRI for her more depressive type symptoms but she wants to hold off at this time.  I aksed her to please have her cardioloist send records since we have none on file.  Diet review for nutrition referral? Yes ____ Not Indicated __x__  Patient Instructions (the written plan) was given to the patient.  Medicare Attestation  I have personally reviewed:  The patient's medical and social history  Their use of alcohol, tobacco or illicit drugs  Their current medications and supplements  The patient's functional ability including ADLs,fall risks, home safety risks, cognitive, and hearing and visual impairment  Diet and physical activities  Evidence for depression or mood disorders  The patient's weight, height, BMI, and visual acuity have been recorded in the chart. I have made referrals, counseling, and provided education to the patient based on review of the above and I have provided the patient with a written personalized care plan for preventive services.       Signed:   [address] [place], [place]  02/28/2017 3:29 PM

## 2017-03-01 LAB — CBC WITH DIFFERENTIAL/PLATELET
BASOS PCT: 1 %
Basophils Absolute: 56 cells/uL (ref 0–200)
EOS PCT: 2 %
Eosinophils Absolute: 112 cells/uL (ref 15–500)
HEMATOCRIT: 43 % (ref 38.5–50.0)
HEMOGLOBIN: 14.3 g/dL (ref 13.0–17.0)
LYMPHS ABS: 1064 {cells}/uL (ref 850–3900)
Lymphocytes Relative: 19 %
MCH: 27.4 pg (ref 27.0–33.0)
MCHC: 33.3 g/dL (ref 32.0–36.0)
MCV: 82.5 fL (ref 80.0–100.0)
MONO ABS: 448 {cells}/uL (ref 200–950)
MPV: 9.4 fL (ref 7.5–12.5)
Monocytes Relative: 8 %
NEUTROS ABS: 3920 {cells}/uL (ref 1500–7800)
NEUTROS PCT: 70 %
Platelets: 182 10*3/uL (ref 140–400)
RBC: 5.21 MIL/uL (ref 4.20–5.80)
RDW: 14.9 % (ref 11.0–15.0)
WBC: 5.6 10*3/uL (ref 3.8–10.8)

## 2017-03-01 LAB — HEMOGLOBIN A1C
HEMOGLOBIN A1C: 6.8 % — AB (ref <5.7)
MEAN PLASMA GLUCOSE: 148 mg/dL

## 2017-03-01 LAB — COMPLETE METABOLIC PANEL WITH GFR
ALBUMIN: 4.5 g/dL (ref 3.6–5.1)
ALT: 9 U/L (ref 9–46)
AST: 10 U/L (ref 10–35)
Alkaline Phosphatase: 62 U/L (ref 40–115)
BUN: 17 mg/dL (ref 7–25)
CALCIUM: 10 mg/dL (ref 8.6–10.3)
CHLORIDE: 103 mmol/L (ref 98–110)
CO2: 21 mmol/L (ref 20–31)
CREATININE: 1.36 mg/dL — AB (ref 0.70–1.25)
GFR, Est African American: 61 mL/min (ref >=60)
GFR, Est Non African American: 53 mL/min — ABNORMAL LOW (ref >=60)
GLUCOSE: 116 mg/dL — AB (ref 70–99)
POTASSIUM: 3.9 mmol/L (ref 3.5–5.3)
SODIUM: 138 mmol/L (ref 135–146)
Total Bilirubin: 0.6 mg/dL (ref 0.2–1.2)
Total Protein: 8 g/dL (ref 6.1–8.1)

## 2017-03-01 LAB — TSH: TSH: 1.06 m[IU]/L (ref 0.40–4.50)

## 2017-03-01 LAB — LIPID PANEL
CHOL/HDL RATIO: 4.2 ratio (ref <5.0)
Cholesterol: 147 mg/dL (ref <200)
HDL: 35 mg/dL — ABNORMAL LOW (ref >40)
LDL Cholesterol: 91 mg/dL (ref <100)
Triglycerides: 104 mg/dL (ref <150)
VLDL: 21 mg/dL (ref <30)

## 2017-03-07 ENCOUNTER — Encounter: Payer: Self-pay | Admitting: Gastroenterology

## 2017-04-18 ENCOUNTER — Telehealth: Payer: Self-pay | Admitting: Physician Assistant

## 2017-04-18 MED ORDER — LOSARTAN POTASSIUM 100 MG PO TABS: 100.0000 mg | ORAL_TABLET | Freq: Every day | ORAL | 0 refills | Status: DC

## 2017-04-18 NOTE — Telephone Encounter (Signed)
RX has been changed patient is aware and appointment has been made

## 2017-04-18 NOTE — Telephone Encounter (Signed)
Remove the valsartan from medication list. Tell patient to stop this. Start losartan 100 mg 1 by mouth daily #30+0. Have patient come in for office visit in 2 weeks so can recheck blood pressure and lab after med change.

## 2017-04-18 NOTE — Telephone Encounter (Signed)
New Message  Pt c/o medication issue:  1. Name of Medication: valsartan (Diovan) 320 mg tablet once daily  2. How are you currently taking this medication (dosage and times per day)? See above  3. Are you having a reaction (difficulty breathing--STAT)? N/A  4. What is your medication issue? Pts wife voiced this medication is discontinued and needs another medication to take its place.  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

## 2017-04-18 NOTE — Telephone Encounter (Signed)
Please advise 

## 2017-04-26 ENCOUNTER — Ambulatory Visit: Payer: Medicare Other | Admitting: Gastroenterology

## 2017-04-30 ENCOUNTER — Ambulatory Visit: Payer: Medicare Other | Admitting: Cardiology

## 2017-05-02 ENCOUNTER — Ambulatory Visit (INDEPENDENT_AMBULATORY_CARE_PROVIDER_SITE_OTHER): Payer: Medicare Other | Admitting: Physician Assistant

## 2017-05-02 ENCOUNTER — Encounter: Payer: Self-pay | Admitting: Physician Assistant

## 2017-05-02 VITALS — BP 132/84 | HR 83 | Temp 98.0°F | Resp 16 | Ht 69.0 in | Wt 199.2 lb

## 2017-05-02 DIAGNOSIS — I10 Essential (primary) hypertension: Secondary | ICD-10-CM

## 2017-05-02 NOTE — Progress Notes (Signed)
Patient ID: KARELL TUKES MRN: [id number], DOB: 1946/07/04, 71 y.o. Date of Encounter: 05/02/2017, 11:19 AM    Chief Complaint:  Chief Complaint  Patient presents with  . recheck blood pressure with new  medication     HPI: 71 y.o. year old male presents for above.   Valsartan was recently recalled. He had been taking valsartan 320 mg. On 04/18/17 we informed patient to stop the valsartan and change to losartan 100 mg daily. Also recommended for him to come in for follow-up visit in 2 weeks to recheck blood pressure and lab after med change.  Today he reports that he has stopped the valsartan and has been taking that losartan 100 mg every day. He is having no lightheadedness or other adverse effects.  No other concerns to address today.     Home Meds:   Outpatient Medications Prior to Visit  Medication Sig Dispense Refill  . acetaminophen (TYLENOL) 325 MG tablet Take 650 mg by mouth every 4 (four) hours as needed. For pain/headache    . amLODipine (NORVASC) 10 MG tablet Take 10 mg by mouth daily.    Marland Kitchen aspirin EC 81 MG tablet Take 81 mg by mouth daily.    . carvedilol (COREG) 25 MG tablet Take 25 mg by mouth daily.   0  . clopidogrel (PLAVIX) 75 MG tablet Take 75 mg by mouth daily with breakfast.    . losartan (COZAAR) 100 MG tablet Take 1 tablet (100 mg total) by mouth daily. 30 tablet 0  . metFORMIN (GLUCOPHAGE) 1000 MG tablet Take 1,000 mg by mouth 2 (two) times daily with a meal.    . nitroGLYCERIN (NITROSTAT) 0.4 MG SL tablet Place 0.4 mg under the tongue every 5 (five) minutes as needed. For chest pain    . simvastatin (ZOCOR) 80 MG tablet Take 80 mg by mouth every evening.     Marland Kitchen spironolactone (ALDACTONE) 25 MG tablet Take 1 tablet (25 mg total) by mouth daily. 30 tablet 1  . simvastatin (ZOCOR) 80 MG tablet Take 80 mg by mouth every evening.    Marland Kitchen spironolactone (ALDACTONE) 25 MG tablet Take 25 mg by mouth daily.    . valsartan (DIOVAN) 320 MG tablet Take 320 mg by  mouth daily.     No facility-administered medications prior to visit.     Allergies: No Known Allergies    Review of Systems: See HPI for pertinent ROS. All other ROS negative.    Physical Exam: Blood pressure 132/84, pulse 83, temperature 98 F (36.7 C), temperature source Oral, resp. rate 16, height 5\' 9"  (1.753 m), weight 199 lb 3.2 oz (90.4 kg), SpO2 98 %., Body mass index is 29.42 kg/m. General:  WNWD AAM. Appears in no acute distress. Neck: Supple. No thyromegaly. No lymphadenopathy. Lungs: Clear bilaterally to auscultation without wheezes, rales, or rhonchi. Breathing is unlabored. Heart: Regular rhythm. No murmurs, rubs, or gallops. Msk:  Strength and tone normal for age. Extremities/Skin: Warm and dry.  Neuro: Alert and oriented X 3. Moves all extremities spontaneously. Gait is normal. CNII-XII grossly in tact. Psych:  Responds to questions appropriately with a normal affect.     ASSESSMENT AND PLAN:  71 y.o. year old male with  1. Essential hypertension Blood pressure is controlled. Continue current medications. Will check lab to follow up lab after medication change. - BASIC METABOLIC PANEL WITH GFR  Last routine visit was with me on 02/28/17. At that time we had planned for follow-up for our  OV 3 months and this is due at the end of September.  Signed, [address] [place], [place], [place] 05/02/2017 11:19 AM

## 2017-05-03 LAB — BASIC METABOLIC PANEL WITH GFR
BUN: 17 mg/dL (ref 7–25)
CALCIUM: 10.1 mg/dL (ref 8.6–10.3)
CO2: 18 mmol/L — ABNORMAL LOW (ref 20–32)
CREATININE: 1.41 mg/dL — AB (ref 0.70–1.25)
Chloride: 104 mmol/L (ref 98–110)
GFR, EST AFRICAN AMERICAN: 59 mL/min — AB (ref >=60)
GFR, Est Non African American: 51 mL/min — ABNORMAL LOW (ref >=60)
GLUCOSE: 157 mg/dL — AB (ref 70–99)
Potassium: 4.2 mmol/L (ref 3.5–5.3)
Sodium: 140 mmol/L (ref 135–146)

## 2017-05-17 ENCOUNTER — Other Ambulatory Visit: Payer: Self-pay | Admitting: Physician Assistant

## 2017-05-30 ENCOUNTER — Ambulatory Visit: Payer: Self-pay | Admitting: Physician Assistant

## 2017-06-03 DIAGNOSIS — I10 Essential (primary) hypertension: Secondary | ICD-10-CM | POA: Diagnosis not present

## 2017-06-03 DIAGNOSIS — E785 Hyperlipidemia, unspecified: Secondary | ICD-10-CM | POA: Diagnosis not present

## 2017-06-03 DIAGNOSIS — E1121 Type 2 diabetes mellitus with diabetic nephropathy: Secondary | ICD-10-CM | POA: Diagnosis not present

## 2017-06-03 DIAGNOSIS — I25810 Atherosclerosis of coronary artery bypass graft(s) without angina pectoris: Secondary | ICD-10-CM | POA: Diagnosis not present

## 2017-06-17 ENCOUNTER — Encounter: Payer: Self-pay | Admitting: Physician Assistant

## 2017-07-09 ENCOUNTER — Encounter (INDEPENDENT_AMBULATORY_CARE_PROVIDER_SITE_OTHER): Payer: Self-pay

## 2017-07-09 ENCOUNTER — Encounter: Payer: Self-pay | Admitting: Interventional Cardiology

## 2017-07-09 ENCOUNTER — Ambulatory Visit (INDEPENDENT_AMBULATORY_CARE_PROVIDER_SITE_OTHER): Payer: Medicare Other | Admitting: Interventional Cardiology

## 2017-07-09 VITALS — BP 162/98 | HR 85 | Ht 69.0 in | Wt 195.6 lb

## 2017-07-09 DIAGNOSIS — I10 Essential (primary) hypertension: Secondary | ICD-10-CM

## 2017-07-09 DIAGNOSIS — E1159 Type 2 diabetes mellitus with other circulatory complications: Secondary | ICD-10-CM

## 2017-07-09 DIAGNOSIS — N183 Chronic kidney disease, stage 3 unspecified: Secondary | ICD-10-CM | POA: Insufficient documentation

## 2017-07-09 DIAGNOSIS — I2510 Atherosclerotic heart disease of native coronary artery without angina pectoris: Secondary | ICD-10-CM | POA: Diagnosis not present

## 2017-07-09 MED ORDER — HYDRALAZINE HCL 10 MG PO TABS: 10.0000 mg | ORAL_TABLET | Freq: Three times a day (TID) | ORAL | 11 refills | Status: DC

## 2017-07-09 NOTE — Progress Notes (Signed)
Cardiology Office Note   Date:  07/09/2017   ID:  [name], DOB 1945/11/19, MRN [id number]  PCP:  Leeroy Cha, MD    No chief complaint on file.  CAD  Wt Readings from Last 3 Encounters:  07/09/17 195 lb 9.6 oz (88.7 kg)  05/02/17 199 lb 3.2 oz (90.4 kg)  02/28/17 198 lb 9.6 oz (90.1 kg)       History of Present Illness: [name] is a 71 y.o. male with a history of coronary artery disease and non-STEMI.  He has had hypertension and diabetes as well.  He had coronary artery bypass surgery in 2009 with LIMA to LAD, SVG to OM, SVG to diagonal and SVG to RCA.  Prior angina was a heavy pressure on the chest.  Last cardiac cath was done in 2013.  At that time, bare-metal stent was placed at the anastomosis of the SVG to OM.  2.5 x 15 vision bare-metal stent.  At that time, he also had a stroke.    Denies : Chest pain. Dizziness. Leg edema. Nitroglycerin use. Orthopnea. Palpitations. Paroxysmal nocturnal dyspnea. Shortness of breath. Syncope.   No regular exercise.  He does yard work.  No problems when he does yard work.    BP at home are in the 134/ 70 range.       Past Medical History:  Diagnosis Date  . Arthritis   . CAD (coronary artery disease), with CABG in 2009 after an MI 03/07/2012  . Cancer of prostate ([place])   . CVA (cerebral infarction)   . DM (diabetes mellitus),poorly controlled 03/07/2012  . Dyslipidemia   . Herpes zoster   . Hypertension   . Hypertensive crisis 03/07/2012  . Myocardial infarction ([place])   . Radiation 02/08/15   prostate  . Spinal stenosis 12/2016  . Unstable angina ([place]) 03/07/2012    Past Surgical History:  Procedure Laterality Date  . 2D Echocardiogram  04/28/2012   EF 30-35%, Diffuse hypokinesis, grade one diastolic dysfunction. Mild MR  . 2D Echocardiogram-Limited  05/20/2012   Definity contrast-EF 45-50%,No LV apical thrombus  . CARDIAC CATHETERIZATION  03/07/2012   Three Promus DE stents to the SVG to  RCA, PTCA atherectomy of RPDA lesion-Angiosculpt ahterectomy balloon  . CARDIAC SURGERY    . CORONARY ARTERY BYPASS GRAFT  2009   LIMA to LAD, SVG to diagonal, SVG to Circ, SVG to distal RCA.  Marland Kitchen LEFT HEART CATHETERIZATION WITH CORONARY ANGIOGRAM N/A 03/07/2012   Procedure: LEFT HEART CATHETERIZATION WITH CORONARY ANGIOGRAM;  Surgeon: Leonie Man, MD;  Location: [facility] CATH LAB;  Service: Cardiovascular;  Laterality: N/A;  . PERCUTANEOUS CORONARY STENT INTERVENTION (PCI-S) Right 03/07/2012   Procedure: PERCUTANEOUS CORONARY STENT INTERVENTION (PCI-S);  Surgeon: Leonie Man, MD;  Location: [facility] CATH LAB;  Service: Cardiovascular;  Laterality: Right;  . PERCUTANEOUS CORONARY STENT INTERVENTION (PCI-S) N/A 03/10/2012   Procedure: PERCUTANEOUS CORONARY STENT INTERVENTION (PCI-S);  Surgeon: Leonie Man, MD;  Location: [facility] CATH LAB;  Service: Cardiovascular;  Laterality: N/A;  . PROSTATE BIOPSY  03/01/14  . Renal Artery Dopplers  09/08/08   Normal renal duplex.  No diameter reduction.      Current Outpatient Prescriptions  Medication Sig Dispense Refill  . acetaminophen (TYLENOL) 325 MG tablet Take 650 mg by mouth every 4 (four) hours as needed. For pain/headache    . amLODipine (NORVASC) 10 MG tablet Take 10 mg by mouth daily.    Marland Kitchen aspirin EC 81 MG tablet Take  81 mg by mouth daily.    . carvedilol (COREG) 25 MG tablet Take 25 mg by mouth daily.   0  . clopidogrel (PLAVIX) 75 MG tablet Take 75 mg by mouth daily with breakfast.    . losartan (COZAAR) 100 MG tablet TAKE 1 TABLET BY MOUTH ONCE DAILY 30 tablet 3  . metFORMIN (GLUCOPHAGE) 500 MG tablet Take 500 mg by mouth 2 (two) times daily with a meal.     . nitroGLYCERIN (NITROSTAT) 0.4 MG SL tablet Place 0.4 mg under the tongue every 5 (five) minutes as needed. For chest pain    . simvastatin (ZOCOR) 80 MG tablet Take 80 mg by mouth every evening.     Marland Kitchen spironolactone (ALDACTONE) 25 MG tablet Take 1 tablet (25 mg total) by mouth daily. 30 tablet  1   No current facility-administered medications for this visit.     Allergies:   Patient has no known allergies.    Social History:  The patient  reports that he quit smoking about 35 years ago. His smoking use included Cigarettes. He has a 0.75 pack-year smoking history. He has never used smokeless tobacco. He reports that he does not drink alcohol or use drugs.   Family History:  The patient's family history includes Heart attack in his father.    ROS:  Please see the history of present illness.   Otherwise, review of systems are positive for back pain.   All other systems are reviewed and negative.    PHYSICAL EXAM: VS:  BP (!) 162/98 (BP Location: Right Arm, Patient Position: Sitting, Cuff Size: Normal)   Pulse 85   Ht 5\' 9"  (1.753 m)   Wt 195 lb 9.6 oz (88.7 kg)   SpO2 98%   BMI 28.89 kg/m  , BMI Body mass index is 28.89 kg/m. GEN: Well nourished, well developed, in no acute distress  HEENT: normal  Neck: no JVD, carotid bruits, or masses Cardiac: RRR; no murmurs, rubs, or gallops,no edema  Respiratory:  clear to auscultation bilaterally, normal work of breathing GI: soft, nontender, nondistended, + BS MS: no deformity or atrophy  Skin: warm and dry, no rash Neuro:  Slow gait, unilateral weakness Psych: euthymic mood, full affect   EKG:   The ekg ordered today demonstrates NSR, inf Q waves, anterior ST-T wave changes- unchanged from prior   Recent Labs: 02/28/2017: ALT 9; Hemoglobin 14.3; Platelets 182; TSH 1.06 05/02/2017: BUN 17; Creat 1.41; Potassium 4.2; Sodium 140   Lipid Panel    Component Value Date/Time   CHOL 147 02/28/2017 1528   TRIG 104 02/28/2017 1528   HDL 35 (L) 02/28/2017 1528   CHOLHDL 4.2 02/28/2017 1528   VLDL 21 02/28/2017 1528   LDLCALC 91 02/28/2017 1528     Other studies Reviewed: Additional studies/ records that were reviewed today with results demonstrating: Prior cath report reviewed.   ASSESSMENT AND PLAN:  1. CAD: No angina  on medical therapy.  Continue aggressive secondary prevention. 2. HTN: Blood pressure elevated today.  Still elevated on my recheck at 160/84.  Will start hydralazine 10 mg p.o. 3 times daily.  He will follow-up in our hypertension clinic.  I asked him to check readings with his home monitor and bring that monitor and at the next visit. 3. DM: Continue aggressive preventive therapy. 4. CRI: Creatinine 1.4.  Avoid nephrotoxins.   Current medicines are reviewed at length with the patient today.  The patient concerns regarding his medicines were addressed.  The  following changes have been made:  No change  Labs/ tests ordered today include:  No orders of the defined types were placed in this encounter.   Recommend 150 minutes/week of aerobic exercise Low fat, low carb, high fiber diet recommended  Disposition:   FU in 6 months   Signed, Larae Grooms, MD  07/09/2017 1:54 PM    [facility] [address], [place], [place]  [address] Phone: [phone]; Fax: [phone]

## 2017-07-09 NOTE — Patient Instructions (Signed)
Medication Instructions:  Your physician has recommended you make the following change in your medication:   START: Hydralazine 10 mg THREE times a day  Labwork: None ordered  Testing/Procedures: None ordered  Follow-Up: Your physician recommends that you schedule a follow-up appointment in: the Hypertension Clinic for Blood Pressure Management in 2-3 weeks or first available.    Your physician wants you to follow-up in: 6 months with Dr. Irish Lack. You will receive a reminder letter in the mail two months in advance. If you don't receive a letter, please call our office to schedule the follow-up appointment.   Any Other Special Instructions Will Be Listed Below (If Applicable).     If you need a refill on your cardiac medications before your next appointment, please call your pharmacy.

## 2017-07-15 ENCOUNTER — Encounter: Payer: Self-pay | Admitting: Internal Medicine

## 2017-07-30 NOTE — Progress Notes (Signed)
Patient ID: [name]                 DOB: [birth year]                      MRN: [id number]     HPI: [name] is a 71 y.o. male referred by Dr. Irish Lack to HTN clinic. PMH is significant for HTN, HLD, CAD with CABG in 2009 and PCI in 2013, MI, CVA, DM, prostate CA, and arthritis. Pt was taking valsartan and was switched to losartan on 04/18/17 due to valsartan recall. At last office visit on 07/09/17, hydralazine 10mg  TID was added due to elevated BP.  Patient arrives to clinic in good spirits today. Denies headache, dizziness, lightheadedness, or falls. Pt brought in home cuff today for [place] to check, our office BP readings were 138/88, 148/96, and 158/98; and his cuff measured 169/103. Pt has had low readings at home with SBP occasionally in the 90s and low 100s, but hasn't had any symptoms when his BP was measuring low.  Of note, pt has not had any low BP readings in clinic historically. Instructed pt to recheck his BP when he gets low readings and continue keeping a BP log. He also brought in all of his bottles of medication for our review and reports adherence to all. Discussed decreasing salt from diet by rinsing canned vegetables and eating less lunch meat, as well as decreasing the amount of coffee he drinks. Pt has taken his BP medications 4 hours before visit.  Current HTN meds: amlodipine 10mg  daily, carvedilol 25mg  daily, hydralazine 10mg  TID, losartan 100mg  daily, spironolactone 25mg  daily Previously tried: lisinopril 20mg  daily, valsartan 320mg  daily (recalled) BP goal: < 130/80 mmHg  Family History: MI in father.  Social History: 0.75 pack-year smoking history, quit 35 years ago. Denies alcohol or other illicit substances.   Diet:  Gets a hamburger/fast food once a week. Breakfast: boiled egg or 2 Lunch: sandwich with lunch meat and light mayo Dinner: canned greens, corn, baked chicken (pours water out of can) Doesn't add salt to food.  Drinks: water, two 8oz cups of  coffee in the morning, will rarely drink 7-UP or apple juice  Exercise: Does yard work and walks around his yard every day for 15-20 minutes.   Home BP readings: Highest: 144/82, Lowest: 90/54, 110/67 and 104/61 (07/19/17), Avg 130s/70s  Wt Readings from Last 3 Encounters:  07/09/17 195 lb 9.6 oz (88.7 kg)  05/02/17 199 lb 3.2 oz (90.4 kg)  02/28/17 198 lb 9.6 oz (90.1 kg)   BP Readings from Last 3 Encounters:  07/09/17 (!) 162/98  05/02/17 132/84  02/28/17 130/88   Pulse Readings from Last 3 Encounters:  07/09/17 85  05/02/17 83  02/28/17 81    Renal function: CrCl cannot be calculated (Patient's most recent lab result is older than the maximum 21 days allowed.).  Past Medical History:  Diagnosis Date  . Arthritis   . CAD (coronary artery disease), with CABG in 2009 after an MI 03/07/2012  . Cancer of prostate ([name])   . CVA (cerebral infarction)   . DM (diabetes mellitus),poorly controlled 03/07/2012  . Dyslipidemia   . Herpes zoster   . Hypertension   . Hypertensive crisis 03/07/2012  . Myocardial infarction ([name])   . Radiation 02/08/15   prostate  . Spinal stenosis 12/2016  . Unstable angina ([name]) 03/07/2012    Current Outpatient Medications on File Prior to Visit  Medication  Sig Dispense Refill  . acetaminophen (TYLENOL) 325 MG tablet Take 650 mg by mouth every 4 (four) hours as needed. For pain/headache    . amLODipine (NORVASC) 10 MG tablet Take 10 mg by mouth daily.    Marland Kitchen aspirin EC 81 MG tablet Take 81 mg by mouth daily.    . carvedilol (COREG) 25 MG tablet Take 25 mg by mouth daily.   0  . clopidogrel (PLAVIX) 75 MG tablet Take 75 mg by mouth daily with breakfast.    . hydrALAZINE (APRESOLINE) 10 MG tablet Take 1 tablet (10 mg total) by mouth 3 (three) times daily. 90 tablet 11  . losartan (COZAAR) 100 MG tablet TAKE 1 TABLET BY MOUTH ONCE DAILY 30 tablet 3  . metFORMIN (GLUCOPHAGE) 500 MG tablet Take 500 mg by mouth 2 (two) times daily with a meal.     .  nitroGLYCERIN (NITROSTAT) 0.4 MG SL tablet Place 0.4 mg under the tongue every 5 (five) minutes as needed. For chest pain    . simvastatin (ZOCOR) 80 MG tablet Take 80 mg by mouth every evening.     Marland Kitchen spironolactone (ALDACTONE) 25 MG tablet Take 1 tablet (25 mg total) by mouth daily. 30 tablet 1   No current facility-administered medications on file prior to visit.     No Known Allergies   Assessment/Plan:  1. Hypertension - Pt BP in office today is 148/96 which is above his goal of < 130/80 mmHg. Discussed decreasing salt and caffeine. Will increase hydralazine to 25mg  TID. Continue all other BP medications. Follow up in 4 weeks in HTN clinic.  Patient seen with Levonne Dixon, PharmD Candidate

## 2017-07-31 ENCOUNTER — Ambulatory Visit (INDEPENDENT_AMBULATORY_CARE_PROVIDER_SITE_OTHER): Payer: Medicare Other | Admitting: Pharmacist

## 2017-07-31 VITALS — BP 158/98 | HR 71

## 2017-07-31 DIAGNOSIS — I10 Essential (primary) hypertension: Secondary | ICD-10-CM | POA: Diagnosis not present

## 2017-07-31 MED ORDER — HYDRALAZINE HCL 25 MG PO TABS: 25.0000 mg | ORAL_TABLET | Freq: Three times a day (TID) | ORAL | 11 refills | Status: DC

## 2017-07-31 NOTE — Patient Instructions (Signed)
It was great to see you today!  1. Start taking 2 tablets of hydralazine 10mg  three times a day until you run out, then pick up your hydralazine 25mg  tablets from the pharmacy and take 1 tablet three times a day.  2. Continue taking all of your other blood pressure medications  3. Continue checking your blood pressure at home and keep a log  Your goal blood pressure is less than 130/80  4. Follow up in 4 weeks in clinic

## 2017-08-23 ENCOUNTER — Encounter: Payer: Self-pay | Admitting: Internal Medicine

## 2017-08-27 NOTE — Progress Notes (Unsigned)
Patient ID: [name]                 DOB: Jan 18, 1946                      MRN: [id number]     HPI: JAYMARION TROMBLY is a 71 y.o. male patient of Dr. Irish Lack who presents today for hypertension follow up. PMH includes HTN, HLD, CAD with CABG in 2009 and PCI in 2013, MI, CVA, DM, prostate CA, and arthritis. Pt was taking valsartan and was switched to losartan on 04/18/17 due to valsartan recall. At last HTN clinic visit hydralazine was increased to 25mg  TID.   He presents today for follow up blood pressure check.   Current HTN meds: amlodipine 10mg  daily, carvedilol 25mg  daily, hydralazine 25mg  TID, losartan 100mg  daily, spironolactone 25mg  daily Previously tried: lisinopril 20mg  daily, valsartan 320mg  daily (recalled) BP goal: < 130/80 mmHg  Family History: MI in father.  Social History: 0.75 pack-year smoking history, quit 35 years ago. Denies alcohol or other illicit substances.   Diet:  Gets a hamburger/fast food once a week. Breakfast: boiled egg or 2 Lunch: sandwich with lunch meat and light mayo Dinner: canned greens, corn, baked chicken (pours water out of can) Doesn't add salt to food.  Drinks: water, two 8oz cups of coffee in the morning, will rarely drink 7-UP or apple juice  Exercise: Does yard work and walks around his yard every day for 15-20 minutes.   Home BP readings:   Wt Readings from Last 3 Encounters:  07/09/17 195 lb 9.6 oz (88.7 kg)  05/02/17 199 lb 3.2 oz (90.4 kg)  02/28/17 198 lb 9.6 oz (90.1 kg)   BP Readings from Last 3 Encounters:  07/31/17 (!) 158/98  07/09/17 (!) 162/98  05/02/17 132/84   Pulse Readings from Last 3 Encounters:  07/31/17 71  07/09/17 85  05/02/17 83    Renal function: CrCl cannot be calculated (Patient's most recent lab result is older than the maximum 21 days allowed.).  Past Medical History:  Diagnosis Date  . Arthritis   . CAD (coronary artery disease), with CABG in 2009 after an MI 03/07/2012  . Cancer of  prostate ([place])   . CVA (cerebral infarction)   . DM (diabetes mellitus),poorly controlled 03/07/2012  . Dyslipidemia   . Herpes zoster   . Hypertension   . Hypertensive crisis 03/07/2012  . Myocardial infarction ([place])   . Radiation 02/08/15   prostate  . Spinal stenosis 12/2016  . Unstable angina ([place]) 03/07/2012    Current Outpatient Medications on File Prior to Visit  Medication Sig Dispense Refill  . acetaminophen (TYLENOL) 325 MG tablet Take 650 mg by mouth every 4 (four) hours as needed. For pain/headache    . amLODipine (NORVASC) 10 MG tablet Take 10 mg by mouth daily.    Marland Kitchen aspirin EC 81 MG tablet Take 81 mg by mouth daily.    . carvedilol (COREG) 25 MG tablet Take 25 mg by mouth daily.   0  . clopidogrel (PLAVIX) 75 MG tablet Take 75 mg by mouth daily with breakfast.    . hydrALAZINE (APRESOLINE) 25 MG tablet Take 1 tablet (25 mg total) by mouth 3 (three) times daily. 90 tablet 11  . losartan (COZAAR) 100 MG tablet TAKE 1 TABLET BY MOUTH ONCE DAILY 30 tablet 3  . metFORMIN (GLUCOPHAGE) 500 MG tablet Take 500 mg by mouth 2 (two) times daily with a meal.     .  nitroGLYCERIN (NITROSTAT) 0.4 MG SL tablet Place 0.4 mg under the tongue every 5 (five) minutes as needed. For chest pain    . simvastatin (ZOCOR) 80 MG tablet Take 80 mg by mouth every evening.     Marland Kitchen spironolactone (ALDACTONE) 25 MG tablet Take 1 tablet (25 mg total) by mouth daily. 30 tablet 1   No current facility-administered medications on file prior to visit.     No Known Allergies  There were no vitals taken for this visit.   Assessment/Plan: Hypertension:    Thank you, Lelan Pons. Patterson Hammersmith, [facility]  08/27/2017 10:39 PM

## 2017-08-28 ENCOUNTER — Ambulatory Visit: Payer: Medicare Other

## 2017-10-08 DIAGNOSIS — I10 Essential (primary) hypertension: Secondary | ICD-10-CM | POA: Diagnosis not present

## 2017-10-08 DIAGNOSIS — N183 Chronic kidney disease, stage 3 (moderate): Secondary | ICD-10-CM | POA: Diagnosis not present

## 2017-10-08 DIAGNOSIS — E785 Hyperlipidemia, unspecified: Secondary | ICD-10-CM | POA: Diagnosis not present

## 2017-10-08 DIAGNOSIS — E1121 Type 2 diabetes mellitus with diabetic nephropathy: Secondary | ICD-10-CM | POA: Diagnosis not present

## 2017-10-17 DIAGNOSIS — Z7984 Long term (current) use of oral hypoglycemic drugs: Secondary | ICD-10-CM | POA: Diagnosis not present

## 2017-10-17 DIAGNOSIS — H524 Presbyopia: Secondary | ICD-10-CM | POA: Diagnosis not present

## 2017-10-17 DIAGNOSIS — H25013 Cortical age-related cataract, bilateral: Secondary | ICD-10-CM | POA: Diagnosis not present

## 2017-10-17 DIAGNOSIS — H2513 Age-related nuclear cataract, bilateral: Secondary | ICD-10-CM | POA: Diagnosis not present

## 2017-10-17 DIAGNOSIS — E119 Type 2 diabetes mellitus without complications: Secondary | ICD-10-CM | POA: Diagnosis not present

## 2018-03-03 DIAGNOSIS — I10 Essential (primary) hypertension: Secondary | ICD-10-CM | POA: Diagnosis not present

## 2018-03-03 DIAGNOSIS — Z1389 Encounter for screening for other disorder: Secondary | ICD-10-CM | POA: Diagnosis not present

## 2018-03-03 DIAGNOSIS — Z1211 Encounter for screening for malignant neoplasm of colon: Secondary | ICD-10-CM | POA: Diagnosis not present

## 2018-03-03 DIAGNOSIS — Z125 Encounter for screening for malignant neoplasm of prostate: Secondary | ICD-10-CM | POA: Diagnosis not present

## 2018-03-03 DIAGNOSIS — E1121 Type 2 diabetes mellitus with diabetic nephropathy: Secondary | ICD-10-CM | POA: Diagnosis not present

## 2018-03-03 DIAGNOSIS — Z13220 Encounter for screening for lipoid disorders: Secondary | ICD-10-CM | POA: Diagnosis not present

## 2018-03-03 DIAGNOSIS — I25810 Atherosclerosis of coronary artery bypass graft(s) without angina pectoris: Secondary | ICD-10-CM | POA: Diagnosis not present

## 2018-03-03 DIAGNOSIS — N183 Chronic kidney disease, stage 3 (moderate): Secondary | ICD-10-CM | POA: Diagnosis not present

## 2018-03-03 DIAGNOSIS — Z0000 Encounter for general adult medical examination without abnormal findings: Secondary | ICD-10-CM | POA: Diagnosis not present

## 2018-03-05 NOTE — Progress Notes (Signed)
Cardiology Office Note   Date:  03/06/2018   ID:  [name], DOB 11/16/1945, MRN [id number]  PCP:  Leeroy Cha, MD    No chief complaint on file.  CAD  Wt Readings from Last 3 Encounters:  03/06/18 199 lb (90.3 kg)  07/09/17 195 lb 9.6 oz (88.7 kg)  05/02/17 199 lb 3.2 oz (90.4 kg)       History of Present Illness: [name] is a 72 y.o. male  with a history of coronary artery disease and non-STEMI.  He has had hypertension and diabetes as well.  He had coronary artery bypass surgery in 2009 with LIMA to LAD, SVG to OM, SVG to diagonal and SVG to RCA.  Prior angina was a heavy pressure on the chest.  Last cardiac cath was done in 2013.  :   Successful PTCA Atherectomy of the RPDA 90% lesion reducing it to ~30% using an Angiosculpt Atherectomy balloon. Successful, complex PCI on the SVG-RCA with 3 overlapping Promus Element DES, post-dilated to ~4.0 mm.   At that time, he also had a stroke.  2013 carotid Doppler showed only mild plaque bilaterally.  Denies : Chest pain. Dizziness. Leg edema. Nitroglycerin use. Orthopnea. Palpitations. Paroxysmal nocturnal dyspnea. Shortness of breath. Syncope.   He works in the yard without problems.  No dedicated exercise.    No bleeding problems.    Past Medical History:  Diagnosis Date  . Arthritis   . CAD (coronary artery disease), with CABG in 2009 after an MI 03/07/2012  . Cancer of prostate ([place])   . CVA (cerebral infarction)   . DM (diabetes mellitus),poorly controlled 03/07/2012  . Dyslipidemia   . Herpes zoster   . Hypertension   . Hypertensive crisis 03/07/2012  . Myocardial infarction ([place])   . Radiation 02/08/15   prostate  . Spinal stenosis 12/2016  . Unstable angina ([place]) 03/07/2012    Past Surgical History:  Procedure Laterality Date  . 2D Echocardiogram  04/28/2012   EF 30-35%, Diffuse hypokinesis, grade one diastolic dysfunction. Mild MR  . 2D Echocardiogram-Limited  05/20/2012   Definity contrast-EF 45-50%,No LV apical thrombus  . CARDIAC CATHETERIZATION  03/07/2012   Three Promus DE stents to the SVG to RCA, PTCA atherectomy of RPDA lesion-Angiosculpt ahterectomy balloon  . CARDIAC SURGERY    . CORONARY ARTERY BYPASS GRAFT  2009   LIMA to LAD, SVG to diagonal, SVG to Circ, SVG to distal RCA.  Marland Kitchen LEFT HEART CATHETERIZATION WITH CORONARY ANGIOGRAM N/A 03/07/2012   Procedure: LEFT HEART CATHETERIZATION WITH CORONARY ANGIOGRAM;  Surgeon: Leonie Man, MD;  Location: [facility] CATH LAB;  Service: Cardiovascular;  Laterality: N/A;  . PERCUTANEOUS CORONARY STENT INTERVENTION (PCI-S) Right 03/07/2012   Procedure: PERCUTANEOUS CORONARY STENT INTERVENTION (PCI-S);  Surgeon: Leonie Man, MD;  Location: [facility] CATH LAB;  Service: Cardiovascular;  Laterality: Right;  . PERCUTANEOUS CORONARY STENT INTERVENTION (PCI-S) N/A 03/10/2012   Procedure: PERCUTANEOUS CORONARY STENT INTERVENTION (PCI-S);  Surgeon: Leonie Man, MD;  Location: [facility] CATH LAB;  Service: Cardiovascular;  Laterality: N/A;  . PROSTATE BIOPSY  03/01/14  . Renal Artery Dopplers  09/08/08   Normal renal duplex.  No diameter reduction.      Current Outpatient Medications  Medication Sig Dispense Refill  . acetaminophen (TYLENOL) 325 MG tablet Take 650 mg by mouth every 4 (four) hours as needed. For pain/headache    . amLODipine (NORVASC) 10 MG tablet Take 10 mg by mouth daily.    Marland Kitchen  aspirin EC 81 MG tablet Take 81 mg by mouth daily.    . carvedilol (COREG) 25 MG tablet Take 25 mg by mouth daily.   0  . clopidogrel (PLAVIX) 75 MG tablet Take 75 mg by mouth daily with breakfast.    . hydrALAZINE (APRESOLINE) 25 MG tablet Take 1 tablet (25 mg total) by mouth 3 (three) times daily. 90 tablet 11  . losartan (COZAAR) 100 MG tablet TAKE 1 TABLET BY MOUTH ONCE DAILY 30 tablet 3  . metFORMIN (GLUCOPHAGE) 500 MG tablet Take 500 mg by mouth 2 (two) times daily with a meal.     . nitroGLYCERIN (NITROSTAT) 0.4 MG SL tablet Place 0.4 mg  under the tongue every 5 (five) minutes as needed. For chest pain    . spironolactone (ALDACTONE) 25 MG tablet Take 1 tablet (25 mg total) by mouth daily. 30 tablet 1  . simvastatin (ZOCOR) 80 MG tablet Take 80 mg by mouth every evening.      No current facility-administered medications for this visit.     Allergies:   Patient has no known allergies.    Social History:  The patient  reports that he quit smoking about 36 years ago. His smoking use included cigarettes. He has a 0.75 pack-year smoking history. He has never used smokeless tobacco. He reports that he does not drink alcohol or use drugs.   Family History:  The patient's family history includes Heart attack in his father.    ROS:  Please see the history of present illness.   Otherwise, review of systems are positive for right sided weakness; no falls..   All other systems are reviewed and negative.    PHYSICAL EXAM: VS:  BP 134/82   Pulse 68   Ht 5\' 8"  (1.727 m)   Wt 199 lb (90.3 kg)   SpO2 98%   BMI 30.26 kg/m  , BMI Body mass index is 30.26 kg/m. GEN: Well nourished, well developed, in no acute distress  HEENT: normal  Neck: no JVD, carotid bruits, or masses Cardiac: RRR; no murmurs, rubs, or gallops,no edema  Respiratory:  clear to auscultation bilaterally, normal work of breathing GI: soft, nontender, nondistended, + BS MS: no deformity or atrophy  Skin: warm and dry, no rash Neuro:  Strength slightly decreased in right arm, slow gait Psych: euthymic mood, full affect     Recent Labs: 05/02/2017: BUN 17; Creat 1.41; Potassium 4.2; Sodium 140   Lipid Panel    Component Value Date/Time   CHOL 147 02/28/2017 1528   TRIG 104 02/28/2017 1528   HDL 35 (L) 02/28/2017 1528   CHOLHDL 4.2 02/28/2017 1528   VLDL 21 02/28/2017 1528   LDLCALC 91 02/28/2017 1528     Other studies Reviewed: Additional studies/ records that were reviewed today with results demonstrating: Labs from 03/03/18 reviewed.   ASSESSMENT  AND PLAN:  1. CAD: No angina on aggressive secondary prevention/medical therapy.  Increase activity as noted below.  Diet recs as noted below.  2. HTN: The current medical regimen is effective;  continue present plan and medications.  COntrolled at home as well.  Hydralazine has helped. 3. DM: A1C 7.0. Managed by PMD. 4. CRI: Cr 1.4 in 6/19.  Current medicines are reviewed at length with the patient today.  The patient concerns regarding his medicines were addressed.  The following changes have been made:  No change  Labs/ tests ordered today include:  No orders of the defined types were placed in this  encounter.   Recommend 150 minutes/week of aerobic exercise Low fat, low carb, high fiber diet recommended  Disposition:   FU in 1 year   Signed, Larae Grooms, MD  03/06/2018 11:10 AM    [address], [place], [place]  [address] Phone: [phone]; Fax: [phone]

## 2018-03-06 ENCOUNTER — Encounter (INDEPENDENT_AMBULATORY_CARE_PROVIDER_SITE_OTHER): Payer: Self-pay

## 2018-03-06 ENCOUNTER — Encounter: Payer: Self-pay | Admitting: Interventional Cardiology

## 2018-03-06 ENCOUNTER — Ambulatory Visit: Payer: Medicare Other | Admitting: Interventional Cardiology

## 2018-03-06 VITALS — BP 134/82 | HR 68 | Ht 68.0 in | Wt 199.0 lb

## 2018-03-06 DIAGNOSIS — I25118 Atherosclerotic heart disease of native coronary artery with other forms of angina pectoris: Secondary | ICD-10-CM

## 2018-03-06 DIAGNOSIS — E1159 Type 2 diabetes mellitus with other circulatory complications: Secondary | ICD-10-CM | POA: Diagnosis not present

## 2018-03-06 DIAGNOSIS — I10 Essential (primary) hypertension: Secondary | ICD-10-CM

## 2018-03-06 DIAGNOSIS — N183 Chronic kidney disease, stage 3 unspecified: Secondary | ICD-10-CM

## 2018-03-06 NOTE — Patient Instructions (Signed)

## 2018-03-12 DIAGNOSIS — Z0000 Encounter for general adult medical examination without abnormal findings: Secondary | ICD-10-CM | POA: Diagnosis not present

## 2018-07-01 ENCOUNTER — Other Ambulatory Visit: Payer: Self-pay | Admitting: Gastroenterology

## 2018-07-02 ENCOUNTER — Telehealth: Payer: Self-pay | Admitting: *Deleted

## 2018-07-02 NOTE — Telephone Encounter (Signed)
   [facility] Pre-operative Risk Assessment    Request for surgical clearance:  1. What type of surgery is being performed? colonoscopy   2. When is this surgery scheduled?  08/12/18   3. What type of clearance is required (medical clearance vs. Pharmacy clearance to hold med vs. Both)?  pharmacy  4. Are there any medications that need to be held prior to surgery and how long? Plavix, length of time not requested   5. Practice name and name of physician performing surgery?  [facility] Dr. Michail Sermon   6. What is your office phone number [phone]    7.   What is your office fax number [phone]  8.   Anesthesia type (None, local, MAC, general) ? Not provided.   Rodman Key 07/02/2018, 4:10 PM  _________________________________________________________________   (provider comments below)

## 2018-07-03 NOTE — Telephone Encounter (Signed)
Dr Irish Lack can you comment on holding Plavix for colonoscopy on this patient- last PCI 2013.  Kerin Ransom PA-C 07/03/2018 2:57 PM

## 2018-07-04 NOTE — Telephone Encounter (Signed)
OK to hold Plavix 5 days prior to colonoscopy.  

## 2018-07-04 NOTE — Telephone Encounter (Signed)
   Primary Cardiologist: Larae Grooms, MD  Chart reviewed as part of pre-operative protocol coverage. Given past medical history and time since last visit, based on ACC/AHA guidelines, [name] would be at acceptable risk for the planned procedure without further cardiovascular testing.   Okay to hold Plavix 5 days prior to colonoscopy  I will route this recommendation to the requesting party via [facility] fax function and remove from pre-op pool.  Please call with questions.  Kerin Ransom, PA-C 07/04/2018, 12:41 PM

## 2018-08-06 ENCOUNTER — Other Ambulatory Visit: Payer: Self-pay | Admitting: Gastroenterology

## 2018-08-12 ENCOUNTER — Other Ambulatory Visit: Payer: Self-pay

## 2018-08-12 ENCOUNTER — Encounter (HOSPITAL_COMMUNITY): Payer: Self-pay

## 2018-08-12 ENCOUNTER — Encounter (HOSPITAL_COMMUNITY)
Admission: RE | Disposition: A | Discharge status: Home / Self Care | Payer: Self-pay | Source: Ambulatory Visit | Attending: Gastroenterology

## 2018-08-12 ENCOUNTER — Ambulatory Visit (HOSPITAL_COMMUNITY): Payer: Medicare Other | Admitting: Anesthesiology

## 2018-08-12 ENCOUNTER — Ambulatory Visit (HOSPITAL_COMMUNITY)
Admission: RE | Admit: 2018-08-12 | Discharge: 2018-08-12 | Disposition: A | Discharge status: Home / Self Care | Payer: Medicare Other | Source: Ambulatory Visit | Attending: Gastroenterology | Admitting: Gastroenterology

## 2018-08-12 DIAGNOSIS — I2510 Atherosclerotic heart disease of native coronary artery without angina pectoris: Secondary | ICD-10-CM | POA: Diagnosis not present

## 2018-08-12 DIAGNOSIS — Z923 Personal history of irradiation: Secondary | ICD-10-CM | POA: Insufficient documentation

## 2018-08-12 DIAGNOSIS — D123 Benign neoplasm of transverse colon: Secondary | ICD-10-CM | POA: Insufficient documentation

## 2018-08-12 DIAGNOSIS — Z7984 Long term (current) use of oral hypoglycemic drugs: Secondary | ICD-10-CM | POA: Insufficient documentation

## 2018-08-12 DIAGNOSIS — I10 Essential (primary) hypertension: Secondary | ICD-10-CM | POA: Diagnosis not present

## 2018-08-12 DIAGNOSIS — Z7982 Long term (current) use of aspirin: Secondary | ICD-10-CM | POA: Diagnosis not present

## 2018-08-12 DIAGNOSIS — K5730 Diverticulosis of large intestine without perforation or abscess without bleeding: Secondary | ICD-10-CM | POA: Diagnosis not present

## 2018-08-12 DIAGNOSIS — Z8546 Personal history of malignant neoplasm of prostate: Secondary | ICD-10-CM | POA: Insufficient documentation

## 2018-08-12 DIAGNOSIS — Z79899 Other long term (current) drug therapy: Secondary | ICD-10-CM | POA: Insufficient documentation

## 2018-08-12 DIAGNOSIS — D120 Benign neoplasm of cecum: Secondary | ICD-10-CM | POA: Insufficient documentation

## 2018-08-12 DIAGNOSIS — K640 First degree hemorrhoids: Secondary | ICD-10-CM | POA: Diagnosis not present

## 2018-08-12 DIAGNOSIS — I252 Old myocardial infarction: Secondary | ICD-10-CM | POA: Insufficient documentation

## 2018-08-12 DIAGNOSIS — E119 Type 2 diabetes mellitus without complications: Secondary | ICD-10-CM | POA: Insufficient documentation

## 2018-08-12 DIAGNOSIS — D122 Benign neoplasm of ascending colon: Secondary | ICD-10-CM | POA: Diagnosis not present

## 2018-08-12 DIAGNOSIS — Z1211 Encounter for screening for malignant neoplasm of colon: Secondary | ICD-10-CM | POA: Diagnosis not present

## 2018-08-12 DIAGNOSIS — Z87891 Personal history of nicotine dependence: Secondary | ICD-10-CM | POA: Diagnosis not present

## 2018-08-12 HISTORY — DX: Adverse effect of unspecified anesthetic, initial encounter: T41.45XA

## 2018-08-12 HISTORY — PX: COLONOSCOPY WITH PROPOFOL: SHX5780

## 2018-08-12 HISTORY — PX: POLYPECTOMY: SHX5525

## 2018-08-12 HISTORY — DX: Other complications of anesthesia, initial encounter: T88.59XA

## 2018-08-12 LAB — GLUCOSE, CAPILLARY: Glucose-Capillary: 143 mg/dL — ABNORMAL HIGH (ref 70–99)

## 2018-08-12 SURGERY — COLONOSCOPY WITH PROPOFOL
Anesthesia: Monitor Anesthesia Care

## 2018-08-12 MED ORDER — SODIUM CHLORIDE 0.9 % IV SOLN: INTRAVENOUS | Status: DC

## 2018-08-12 MED ORDER — PROPOFOL 10 MG/ML IV BOLUS
INTRAVENOUS | Status: DC | PRN
  Administered 2018-08-12: 50 mg via INTRAVENOUS
  Administered 2018-08-12: 20 mg via INTRAVENOUS
  Administered 2018-08-12 (×5): 50 mg via INTRAVENOUS
  Administered 2018-08-12: 20 mg via INTRAVENOUS
  Administered 2018-08-12 (×3): 50 mg via INTRAVENOUS

## 2018-08-12 MED ORDER — LACTATED RINGERS IV SOLN
INTRAVENOUS | Status: DC
  Administered 2018-08-12 (×2): via INTRAVENOUS

## 2018-08-12 MED ORDER — LIDOCAINE 2% (20 MG/ML) 5 ML SYRINGE
INTRAMUSCULAR | Status: DC | PRN
  Administered 2018-08-12: 60 mg via INTRAVENOUS

## 2018-08-12 MED ORDER — PROPOFOL 10 MG/ML IV BOLUS
INTRAVENOUS | Status: AC
  Filled 2018-08-12: qty 20

## 2018-08-12 MED ORDER — PROPOFOL 10 MG/ML IV BOLUS
INTRAVENOUS | Status: AC
  Filled 2018-08-12: qty 40

## 2018-08-12 SURGICAL SUPPLY — 22 items

## 2018-08-12 NOTE — Anesthesia Procedure Notes (Signed)
Procedure Name: MAC Date/Time: 08/12/2018 11:04 AM Performed by: Cynda Familia, CRNA Pre-anesthesia Checklist: Patient identified, Emergency Drugs available, Suction available, Patient being monitored and Timeout performed Patient Re-evaluated:Patient Re-evaluated prior to induction Oxygen Delivery Method: Simple face mask Placement Confirmation: positive ETCO2 and breath sounds checked- equal and bilateral Dental Injury: Teeth and Oropharynx as per pre-operative assessment

## 2018-08-12 NOTE — Anesthesia Postprocedure Evaluation (Signed)
Anesthesia Post Note  Patient: [name]  Procedure(s) Performed: COLONOSCOPY WITH PROPOFOL (N/A ) POLYPECTOMY     Patient location during evaluation: PACU Anesthesia Type: MAC Level of consciousness: awake and alert Pain management: pain level controlled Vital Signs Assessment: post-procedure vital signs reviewed and stable Respiratory status: spontaneous breathing, nonlabored ventilation, respiratory function stable and patient connected to nasal cannula oxygen Cardiovascular status: stable and blood pressure returned to baseline Postop Assessment: no apparent nausea or vomiting Anesthetic complications: no    Last Vitals:  Vitals:   08/12/18 1210 08/12/18 1220  BP: 126/84 (!) 160/87  Pulse: 60 (!) 59  Resp: 18 12  Temp:    SpO2: 93% 95%    Last Pain:  Vitals:   08/12/18 1220  TempSrc:   PainSc: 0-No pain                 Bookert Guzzi

## 2018-08-12 NOTE — Discharge Instructions (Addendum)
YOU HAD AN ENDOSCOPIC PROCEDURE TODAY: Refer to the procedure report and other information in the discharge instructions given to you for any specific questions about what was found during the examination. If this information does not answer your questions, please call [facility] office at [phone] to clarify.   YOU SHOULD EXPECT: Some feelings of bloating in the abdomen. Passage of more gas than usual. Walking can help get rid of the air that was put into your GI tract during the procedure and reduce the bloating. If you had a lower endoscopy (such as a colonoscopy or flexible sigmoidoscopy) you may notice spotting of blood in your stool or on the toilet paper. Some abdominal soreness may be present for a day or two, also.  DIET: Your first meal following the procedure should be a light meal and then it is ok to progress to your normal diet. A half-sandwich or bowl of soup is an example of a good first meal. Heavy or fried foods are harder to digest and may make you feel nauseous or bloated. Drink plenty of fluids but you should avoid alcoholic beverages for 24 hours. If you had a esophageal dilation, please see attached instructions for diet.   ACTIVITY: Your care partner should take you home directly after the procedure. You should plan to take it easy, moving slowly for the rest of the day. You can resume normal activity the day after the procedure however YOU SHOULD NOT DRIVE, use power tools, machinery or perform tasks that involve climbing or major physical exertion for 24 hours (because of the sedation medicines used during the test).   SYMPTOMS TO REPORT IMMEDIATELY: A gastroenterologist can be reached at any hour. Please call [phone]  for any of the following symptoms:   Following lower endoscopy (colonoscopy, flexible sigmoidoscopy) Excessive amounts of blood in the stool  Significant tenderness, worsening of abdominal pains  Swelling of the abdomen that is new, acute  Fever of 100  or higher   Following upper endoscopy (EGD, EUS, ERCP, esophageal dilation) Vomiting of blood or coffee ground material  New, significant abdominal pain  New, significant chest pain or pain under the shoulder blades  Painful or persistently difficult swallowing  New shortness of breath  Black, tarry-looking or red, bloody stools  FOLLOW UP:  If any biopsies were taken you will be contacted by phone or by letter within the next 1-3 weeks. Call [phone]  if you have not heard about the biopsies in 3 weeks.  Please also call with any specific questions about appointments or follow up tests.   Continue to HOLD PLAVIX until Friday 08/15/18 when you can resume taking it.

## 2018-08-12 NOTE — Transfer of Care (Signed)
Immediate Anesthesia Transfer of Care Note  Patient: [name]  Procedure(s) Performed: COLONOSCOPY WITH PROPOFOL (N/A ) POLYPECTOMY  Patient Location: PACU and Endoscopy Unit  Anesthesia Type:MAC  Level of Consciousness: sedated  Airway & Oxygen Therapy: Patient Spontanous Breathing and Patient connected to face mask oxygen  Post-op Assessment: Report given to RN and Post -op Vital signs reviewed and stable  Post vital signs: Reviewed and stable  Last Vitals:  Vitals Value Taken Time  BP    Temp    Pulse 55 08/12/2018 11:57 AM  Resp 17 08/12/2018 11:57 AM  SpO2 100 % 08/12/2018 11:57 AM  Vitals shown include unvalidated device data.  Last Pain:  Vitals:   08/12/18 0839  TempSrc: Oral  PainSc: 0-No pain         Complications: No apparent anesthesia complications

## 2018-08-12 NOTE — Op Note (Signed)
[facility] Patient Name: [name] Procedure Date: 08/12/2018 MRN: [id number] Attending MD: Lear Ng , MD Date of Birth: March 21, 1946 CSN: [id number] Age: 72 Admit Type: Outpatient Procedure:                Colonoscopy Indications:              Screening for colorectal malignant neoplasm, Last                            colonoscopy: date unknown Providers:                Lear Ng, MD, Elmer Ramp. Hinson, RN, [employer], Technician, Glenis Smoker, CRNA Referring MD:             Birdie Sons Medicines:                Propofol per Anesthesia, Monitored Anesthesia Care Complications:            No immediate complications. Estimated Blood Loss:     Estimated blood loss was minimal. Procedure:                Pre-Anesthesia Assessment:                           - Prior to the procedure, a History and Physical                            was performed, and patient medications and                            allergies were reviewed. The patient's tolerance of                            previous anesthesia was also reviewed. The risks                            and benefits of the procedure and the sedation                            options and risks were discussed with the patient.                            All questions were answered, and informed consent                            was obtained. Prior Anticoagulants: The patient has                            taken Plavix (clopidogrel), last dose was 5 days                            prior to procedure. ASA Grade Assessment: III - A  patient with severe systemic disease. After                            reviewing the risks and benefits, the patient was                            deemed in satisfactory condition to undergo the                            procedure.                           After obtaining informed consent, the colonoscope                             was passed under direct vision. Throughout the                            procedure, the patient's blood pressure, pulse, and                            oxygen saturations were monitored continuously. The                            EC-[id number] ([id number]) scope was introduced through                            the anus and advanced to the the cecum, identified                            by appendiceal orifice and ileocecal valve. The                            colonoscopy was performed with difficulty due to a                            tortuous colon and fair prep. Successful completion                            of the procedure was aided by straightening and                            shortening the scope to obtain bowel loop reduction                            and lavage. The patient tolerated the procedure                            well. The quality of the bowel preparation was                            fair. The terminal ileum, ileocecal valve,  appendiceal orifice, and rectum were photographed. Scope In: 11:11:58 AM Scope Out: 11:50:55 AM Scope Withdrawal Time: 0 hours 32 minutes 58 seconds  Total Procedure Duration: 0 hours 38 minutes 57 seconds  Findings:      The perianal and digital rectal examinations were normal.      A 4 mm polyp was found in the cecum. The polyp was sessile. The polyp       was removed with a hot snare. Resection and retrieval were complete.       Estimated blood loss: none.      A 14 mm polyp was found in the cecum. The polyp was sessile. The polyp       was removed with a piecemeal technique using a hot snare. Resection and       retrieval were complete. Estimated blood loss: none.      A 12 mm polyp was found in the proximal ascending colon. The polyp was       pedunculated. The polyp was removed with a hot snare. Resection and       retrieval were complete. Estimated blood loss: none.      A 12 mm  polyp was found in the hepatic flexure. The polyp was       pedunculated. The polyp was removed with a hot snare. Resection and       retrieval were complete. Estimated blood loss: none.      A 10 mm polyp was found in the transverse colon. The polyp was       pedunculated. The polyp was removed with a hot snare. Resection and       retrieval were complete. Estimated blood loss was minimal.      Scattered small and large-mouthed diverticula were found in the entire       colon.      Internal hemorrhoids were found during retroflexion. The hemorrhoids       were medium-sized and Grade I (internal hemorrhoids that do not       prolapse).      A segmental area of moderately telangiectasias mucosa was found in the       distal rectum. Impression:               - Preparation of the colon was fair.                           - One 4 mm polyp in the cecum, removed with a hot                            snare. Resected and retrieved.                           - One 14 mm polyp in the cecum, removed piecemeal                            using a hot snare. Resected and retrieved.                           - One 12 mm polyp in the proximal ascending colon,                            removed  with a hot snare. Resected and retrieved.                           - One 12 mm polyp at the hepatic flexure, removed                            with a hot snare. Resected and retrieved.                           - One 10 mm polyp in the transverse colon, removed                            with a hot snare. Resected and retrieved.                           - Diverticulosis in the entire examined colon.                           - Internal hemorrhoids.                           - Telangiectasias mucosa in the distal rectum from                            previous radiation treatment for prostate cancer. Moderate Sedation:      Not Applicable - Patient had care per Anesthesia. Recommendation:           - Patient has  a contact number available for                            emergencies. The signs and symptoms of potential                            delayed complications were discussed with the                            patient. Return to normal activities tomorrow.                            Written discharge instructions were provided to the                            patient.                           - High fiber diet.                           - Await pathology results.                           - Repeat colonoscopy for surveillance based on                            pathology results.                           -  Resume Plavix (clopidogrel) at prior dose in 3                            days. Procedure Code(s):        --- Professional ---                           [id number], Colonoscopy, flexible; with removal of                            tumor(s), polyp(s), or other lesion(s) by snare                            technique Diagnosis Code(s):        --- Professional ---                           Z12.11, Encounter for screening for malignant                            neoplasm of colon                           D12.0, Benign neoplasm of cecum                           D12.2, Benign neoplasm of ascending colon                           D12.3, Benign neoplasm of transverse colon (hepatic                            flexure or splenic flexure)                           K64.0, First degree hemorrhoids                           K57.30, Diverticulosis of large intestine without                            perforation or abscess without bleeding CPT copyright 2018 [employer]. All rights reserved. The codes documented in this report are preliminary and upon coder review may  be revised to meet current compliance requirements. Lear Ng, MD 08/12/2018 12:05:44 PM This report has been signed electronically. Number of Addenda: 0

## 2018-08-12 NOTE — Interval H&P Note (Signed)
History and Physical Interval Note:  08/12/2018 10:25 AM  [name]  has presented today for surgery, with the diagnosis of Screening  The various methods of treatment have been discussed with the patient and family. After consideration of risks, benefits and other options for treatment, the patient has consented to  Procedure(s): COLONOSCOPY WITH PROPOFOL (N/A) as a surgical intervention .  The patient's history has been reviewed, patient examined, no change in status, stable for surgery.  I have reviewed the patient's chart and labs.  Questions were answered to the patient's satisfaction.     Lear Ng

## 2018-08-12 NOTE — H&P (Signed)
Date of Initial H&P: 08/06/18  History reviewed, patient examined, no change in status, stable for surgery.

## 2018-08-12 NOTE — Anesthesia Preprocedure Evaluation (Addendum)
Anesthesia Evaluation  Patient identified by MRN, date of birth, ID band Patient awake    Reviewed: Allergy & Precautions, H&P , NPO status , Patient's Chart, lab work & pertinent test results, reviewed documented beta blocker date and time   History of Anesthesia Complications (+) DIFFICULT AIRWAY and history of anesthetic complications  Airway Mallampati: III  TM Distance: >3 FB Neck ROM: Limited  Mouth opening: Limited Mouth Opening Comment: H/O DIFFICULT INTUBATION 09' Dental no notable dental hx.    Pulmonary neg pulmonary ROS, former smoker,    Pulmonary exam normal breath sounds clear to auscultation       Cardiovascular Exercise Tolerance: Good hypertension, Pt. on medications + angina + CAD and + Past MI   Rhythm:regular Rate:Normal  ECHO 13 Study Conclusions  Left ventricle: The cavity size was normal. Wall thickness was normal. Systolic function was mildly reduced. The estimated ejection fraction was in the range of 45% to 50%. Mild inferior hypokinesis   Neuro/Psych negative neurological ROS  negative psych ROS   GI/Hepatic negative GI ROS, Neg liver ROS,   Endo/Other  diabetes, Type 2  Renal/GU Renal disease  negative genitourinary   Musculoskeletal  (+) Arthritis , Osteoarthritis,    Abdominal   Peds  Hematology negative hematology ROS (+)   Anesthesia Other Findings DM ,poorly controlled CAD  with CABG in 2009 after an MI NSTEMI -03/07/12 SVG-OM BMS with staged SVG-RCA DES and PDA PCI 03/10/12 Dyslipidemia Acute ischemic stroke. 04/28/12 Hypertensive emergency Hyperglycemia Ischemic cardiomyopathy, EF 30-35% 2D 04/28/12 HTN , poor control CVA exstension 05/02/12 by CT Diabetes mellitus  Spastic hemiplegia affecting dominant side (  Malignant neoplasm of prostate ( Diabetes mellitus type 2, uncomplicated  CRI  stage 3    Reproductive/Obstetrics negative OB ROS                           Anesthesia Physical Anesthesia Plan  ASA: III  Anesthesia Plan: MAC   Post-op Pain Management:    Induction: Intravenous  PONV Risk Score and Plan:   Airway Management Planned: Mask, Natural Airway and Nasal Cannula  Additional Equipment:   Intra-op Plan:   Post-operative Plan:   Informed Consent: I have reviewed the patients History and Physical, chart, labs and discussed the procedure including the risks, benefits and alternatives for the proposed anesthesia with the patient or authorized representative who has indicated his/her understanding and acceptance.   Dental Advisory Given  Plan Discussed with: CRNA, Anesthesiologist and Surgeon  Anesthesia Plan Comments:        Anesthesia Quick Evaluation

## 2018-08-14 ENCOUNTER — Encounter (HOSPITAL_COMMUNITY): Payer: Self-pay | Admitting: Gastroenterology

## 2018-08-28 DIAGNOSIS — E785 Hyperlipidemia, unspecified: Secondary | ICD-10-CM | POA: Diagnosis not present

## 2018-08-28 DIAGNOSIS — I25810 Atherosclerosis of coronary artery bypass graft(s) without angina pectoris: Secondary | ICD-10-CM | POA: Diagnosis not present

## 2018-08-28 DIAGNOSIS — I10 Essential (primary) hypertension: Secondary | ICD-10-CM | POA: Diagnosis not present

## 2018-08-28 DIAGNOSIS — Z1159 Encounter for screening for other viral diseases: Secondary | ICD-10-CM | POA: Diagnosis not present

## 2018-08-28 DIAGNOSIS — E1121 Type 2 diabetes mellitus with diabetic nephropathy: Secondary | ICD-10-CM | POA: Diagnosis not present

## 2018-09-15 ENCOUNTER — Other Ambulatory Visit: Payer: Self-pay | Admitting: Interventional Cardiology

## 2019-02-05 DIAGNOSIS — Z0120 Encounter for dental examination and cleaning without abnormal findings: Secondary | ICD-10-CM | POA: Diagnosis not present

## 2019-03-10 DIAGNOSIS — Z23 Encounter for immunization: Secondary | ICD-10-CM | POA: Diagnosis not present

## 2019-03-10 DIAGNOSIS — Z0000 Encounter for general adult medical examination without abnormal findings: Secondary | ICD-10-CM | POA: Diagnosis not present

## 2019-03-10 DIAGNOSIS — E785 Hyperlipidemia, unspecified: Secondary | ICD-10-CM | POA: Diagnosis not present

## 2019-03-10 DIAGNOSIS — E1121 Type 2 diabetes mellitus with diabetic nephropathy: Secondary | ICD-10-CM | POA: Diagnosis not present

## 2019-05-03 NOTE — Progress Notes (Signed)
Cardiology Office Note   Date:  05/05/2019   ID:  [name], DOB 10/16/1945, MRN [id number]  PCP:  Leeroy Cha, MD    No chief complaint on file.  CAD  Wt Readings from Last 3 Encounters:  05/05/19 195 lb 12.8 oz (88.8 kg)  08/12/18 200 lb (90.7 kg)  03/06/18 199 lb (90.3 kg)       History of Present Illness: [name] is a 73 y.o. male  with a history of coronary artery disease and non-STEMI. He has had hypertension and diabetes as well. He had coronary artery bypass surgery in 2009 with LIMA to LAD, SVG to OM, SVG to diagonal and SVG to RCA. Prior angina was a heavy pressure on the chest.  Last cardiac cath was done in 2013. :   Successful PTCA Atherectomy of the RPDA 90% lesion reducing it to ~30% using an Angiosculpt Atherectomy balloon. Successful, complex PCI on the SVG-RCA with 3 overlapping Promus Element DES, post-dilated to ~4.0 mm.  At that time, he also had a stroke.2013 carotid Doppler showed only mild plaque bilaterally.  Working in the yard was most strenuous exercise.   He walks some, > 150 minutes/week.  Denies : Chest pain. Dizziness. Leg edema. Nitroglycerin use. Orthopnea. Palpitations. Paroxysmal nocturnal dyspnea. Shortness of breath. Syncope.   He wears a mask and keeps his distance from people.     Past Medical History:  Diagnosis Date  . Arthritis   . CAD (coronary artery disease), with CABG in 2009 after an MI 03/07/2012  . Cancer of prostate ([place])   . Complication of anesthesia    hard to intubate  . CVA (cerebral infarction)   . DM (diabetes mellitus),poorly controlled 03/07/2012  . Dyslipidemia   . Herpes zoster   . Hypertension   . Hypertensive crisis 03/07/2012  . Myocardial infarction ([place])   . Radiation 02/08/15   prostate  . Spinal stenosis 12/2016  . Unstable angina ([place]) 03/07/2012    Past Surgical History:  Procedure Laterality Date  . 2D Echocardiogram  04/28/2012   EF 30-35%,  Diffuse hypokinesis, grade one diastolic dysfunction. Mild MR  . 2D Echocardiogram-Limited  05/20/2012   Definity contrast-EF 45-50%,No LV apical thrombus  . CARDIAC CATHETERIZATION  03/07/2012   Three Promus DE stents to the SVG to RCA, PTCA atherectomy of RPDA lesion-Angiosculpt ahterectomy balloon  . CARDIAC SURGERY    . COLONOSCOPY WITH PROPOFOL N/A 08/12/2018   Procedure: COLONOSCOPY WITH PROPOFOL;  Surgeon: Wilford Corner, MD;  Location: [facility];  Service: Endoscopy;  Laterality: N/A;  . CORONARY ARTERY BYPASS GRAFT  2009   LIMA to LAD, SVG to diagonal, SVG to Circ, SVG to distal RCA.  Marland Kitchen LEFT HEART CATHETERIZATION WITH CORONARY ANGIOGRAM N/A 03/07/2012   Procedure: LEFT HEART CATHETERIZATION WITH CORONARY ANGIOGRAM;  Surgeon: Leonie Man, MD;  Location: [facility] CATH LAB;  Service: Cardiovascular;  Laterality: N/A;  . PERCUTANEOUS CORONARY STENT INTERVENTION (PCI-S) Right 03/07/2012   Procedure: PERCUTANEOUS CORONARY STENT INTERVENTION (PCI-S);  Surgeon: Leonie Man, MD;  Location: [facility] CATH LAB;  Service: Cardiovascular;  Laterality: Right;  . PERCUTANEOUS CORONARY STENT INTERVENTION (PCI-S) N/A 03/10/2012   Procedure: PERCUTANEOUS CORONARY STENT INTERVENTION (PCI-S);  Surgeon: Leonie Man, MD;  Location: [facility] CATH LAB;  Service: Cardiovascular;  Laterality: N/A;  . POLYPECTOMY  08/12/2018   Procedure: POLYPECTOMY;  Surgeon: Wilford Corner, MD;  Location: [facility];  Service: Endoscopy;;  . PROSTATE BIOPSY  03/01/14  . Renal Artery Dopplers  09/08/08   Normal renal duplex.  No diameter reduction.      Current Outpatient Medications  Medication Sig Dispense Refill  . acetaminophen (TYLENOL) 325 MG tablet Take 650 mg by mouth every 4 (four) hours as needed. For pain/headache    . amLODipine (NORVASC) 10 MG tablet Take 10 mg by mouth daily.    Marland Kitchen aspirin EC 81 MG tablet Take 81 mg by mouth daily.    . carvedilol (COREG) 25 MG tablet Take 25 mg by mouth daily.   0  .  clopidogrel (PLAVIX) 75 MG tablet Take 75 mg by mouth daily with breakfast.    . hydrALAZINE (APRESOLINE) 25 MG tablet TAKE 1 TABLET BY MOUTH THREE TIMES DAILY 90 tablet 5  . losartan (COZAAR) 100 MG tablet TAKE 1 TABLET BY MOUTH ONCE DAILY 30 tablet 3  . metFORMIN (GLUCOPHAGE) 500 MG tablet Take 500 mg by mouth 2 (two) times daily with a meal.     . nitroGLYCERIN (NITROSTAT) 0.4 MG SL tablet Place 0.4 mg under the tongue every 5 (five) minutes as needed. For chest pain    . simvastatin (ZOCOR) 80 MG tablet Take 80 mg by mouth every evening.     Marland Kitchen spironolactone (ALDACTONE) 25 MG tablet Take 1 tablet (25 mg total) by mouth daily. 30 tablet 1   No current facility-administered medications for this visit.     Allergies:   Patient has no known allergies.    Social History:  The patient  reports that he quit smoking about 37 years ago. His smoking use included cigarettes. He has a 0.75 pack-year smoking history. He has never used smokeless tobacco. He reports that he does not drink alcohol or use drugs.   Family History:  The patient's family history includes Heart attack in his father.    ROS:  Please see the history of present illness.   Otherwise, review of systems are positive for easy bruising.   All other systems are reviewed and negative.    PHYSICAL EXAM: VS:  BP (!) 128/92   Pulse 79   Ht 5\' 8"  (1.727 m)   Wt 195 lb 12.8 oz (88.8 kg)   SpO2 98%   BMI 29.77 kg/m  , BMI Body mass index is 29.77 kg/m. GEN: Well nourished, well developed, in no acute distress  HEENT: normal  Neck: no JVD, carotid bruits, or masses Cardiac: RRR; no murmurs, rubs, or gallops,no edema  Respiratory:  clear to auscultation bilaterally, normal work of breathing GI: soft, nontender, nondistended, + BS MS: no deformity or atrophy  Skin: warm and dry, no rash Neuro:  Strength and sensation are intact Psych: euthymic mood, full affect   EKG:   The ekg ordered today demonstrates NSR, inferior Q  waves, nonspecific ST changes   Recent Labs: No results found for requested labs within last 8760 hours.   Lipid Panel    Component Value Date/Time   CHOL 147 02/28/2017 1528   TRIG 104 02/28/2017 1528   HDL 35 (L) 02/28/2017 1528   CHOLHDL 4.2 02/28/2017 1528   VLDL 21 02/28/2017 1528   LDLCALC 91 02/28/2017 1528     Other studies Reviewed: Additional studies/ records that were reviewed today with results demonstrating: labs reviewed.  EF 45-50% in 2013.   ASSESSMENT AND PLAN:  1. CAD:  No angina.  COntinue aggressive secondary prevention.  LDL 89 in June 2020.  2. HTN: Hydralazine was a helpful edition in the past.  The current medical regimen  is effective;  continue present plan and medications. 3. DM: Managed by PMD.  A1C 6.6 in June 2020.  He has lost weight with exercise.   4. CRI: Cr stable, 1.47 in June 2020.    Current medicines are reviewed at length with the patient today.  The patient concerns regarding his medicines were addressed.  The following changes have been made:  No change  Labs/ tests ordered today include:  No orders of the defined types were placed in this encounter.   Recommend 150 minutes/week of aerobic exercise Low fat, low carb, high fiber diet recommended  Disposition:   FU in 1 year   Signed, Larae Grooms, MD  05/05/2019 10:51 AM    [facility] [place], [place], [place]  [address] Phone: [phone]; Fax: [phone]

## 2019-05-05 ENCOUNTER — Ambulatory Visit (INDEPENDENT_AMBULATORY_CARE_PROVIDER_SITE_OTHER): Payer: Medicare Other | Admitting: Interventional Cardiology

## 2019-05-05 ENCOUNTER — Encounter: Payer: Self-pay | Admitting: Interventional Cardiology

## 2019-05-05 ENCOUNTER — Other Ambulatory Visit: Payer: Self-pay

## 2019-05-05 VITALS — BP 128/92 | HR 79 | Ht 68.0 in | Wt 195.8 lb

## 2019-05-05 DIAGNOSIS — N183 Chronic kidney disease, stage 3 unspecified: Secondary | ICD-10-CM

## 2019-05-05 DIAGNOSIS — E1159 Type 2 diabetes mellitus with other circulatory complications: Secondary | ICD-10-CM | POA: Diagnosis not present

## 2019-05-05 DIAGNOSIS — I10 Essential (primary) hypertension: Secondary | ICD-10-CM

## 2019-05-05 DIAGNOSIS — I25118 Atherosclerotic heart disease of native coronary artery with other forms of angina pectoris: Secondary | ICD-10-CM | POA: Diagnosis not present

## 2019-05-05 NOTE — Patient Instructions (Signed)
Medication Instructions:  Your physician recommends that you continue on your current medications as directed. Please refer to the Current Medication list given to you today.  If you need a refill on your cardiac medications before your next appointment, please call your pharmacy.   Lab work: NONE  If you have labs (blood work) drawn today and your tests are completely normal, you will receive your results only by: Marland Kitchen MyChart Message (if you have MyChart) OR . A paper copy in the mail If you have any lab test that is abnormal or we need to change your treatment, we will call you to review the results.  Testing/Procedures: NONE  Follow-Up: At [facility], you and your health needs are our priority.  As part of our continuing mission to provide you with exceptional heart care, we have created designated Provider Care Teams.  These Care Teams include your primary Cardiologist (physician) and Advanced Practice Providers (APPs -  Physician Assistants and Nurse Practitioners) who all work together to provide you with the care you need, when you need it. You will need a follow up appointment in 12 months.  Please call our office 2 months in advance to schedule this appointment.  You may see Larae Grooms, MD or one of the following Advanced Practice Providers on your designated Care Team:   [place], PA-C Melina Copa, PA-C . Ermalinda Barrios, PA-C  Any Other Special Instructions Will Be Listed Below (If Applicable). NONE

## 2019-05-21 DIAGNOSIS — Z0120 Encounter for dental examination and cleaning without abnormal findings: Secondary | ICD-10-CM | POA: Diagnosis not present

## 2019-06-08 ENCOUNTER — Other Ambulatory Visit: Payer: Self-pay | Admitting: Interventional Cardiology

## 2019-06-09 ENCOUNTER — Other Ambulatory Visit: Payer: Self-pay | Admitting: Interventional Cardiology

## 2019-06-09 MED ORDER — HYDRALAZINE HCL 25 MG PO TABS: 25.0000 mg | ORAL_TABLET | Freq: Three times a day (TID) | ORAL | 11 refills | Status: AC

## 2019-08-10 DIAGNOSIS — E119 Type 2 diabetes mellitus without complications: Secondary | ICD-10-CM | POA: Diagnosis not present

## 2019-08-10 DIAGNOSIS — H524 Presbyopia: Secondary | ICD-10-CM | POA: Diagnosis not present

## 2019-08-10 DIAGNOSIS — H2513 Age-related nuclear cataract, bilateral: Secondary | ICD-10-CM | POA: Diagnosis not present

## 2019-08-10 DIAGNOSIS — H25013 Cortical age-related cataract, bilateral: Secondary | ICD-10-CM | POA: Diagnosis not present

## 2019-08-27 DIAGNOSIS — N183 Chronic kidney disease, stage 3 unspecified: Secondary | ICD-10-CM | POA: Diagnosis not present

## 2019-08-27 DIAGNOSIS — E785 Hyperlipidemia, unspecified: Secondary | ICD-10-CM | POA: Diagnosis not present

## 2019-08-27 DIAGNOSIS — E1121 Type 2 diabetes mellitus with diabetic nephropathy: Secondary | ICD-10-CM | POA: Diagnosis not present

## 2019-08-27 DIAGNOSIS — I10 Essential (primary) hypertension: Secondary | ICD-10-CM | POA: Diagnosis not present

## 2020-03-10 DIAGNOSIS — I25810 Atherosclerosis of coronary artery bypass graft(s) without angina pectoris: Secondary | ICD-10-CM | POA: Diagnosis not present

## 2020-03-10 DIAGNOSIS — E1122 Type 2 diabetes mellitus with diabetic chronic kidney disease: Secondary | ICD-10-CM | POA: Diagnosis not present

## 2020-03-10 DIAGNOSIS — I10 Essential (primary) hypertension: Secondary | ICD-10-CM | POA: Diagnosis not present

## 2020-03-10 DIAGNOSIS — Z0000 Encounter for general adult medical examination without abnormal findings: Secondary | ICD-10-CM | POA: Diagnosis not present

## 2020-03-10 DIAGNOSIS — N183 Chronic kidney disease, stage 3 unspecified: Secondary | ICD-10-CM | POA: Diagnosis not present

## 2020-03-10 DIAGNOSIS — Z1389 Encounter for screening for other disorder: Secondary | ICD-10-CM | POA: Diagnosis not present

## 2020-08-01 NOTE — Progress Notes (Signed)
Cardiology Office Note   Date:  08/02/2020   ID:  [name], DOB 10/07/1945, MRN [id number]  PCP:  Leeroy Cha, MD    No chief complaint on file.  CAD  Wt Readings from Last 3 Encounters:  08/02/20 188 lb 6.4 oz (85.5 kg)  05/05/19 195 lb 12.8 oz (88.8 kg)  08/12/18 200 lb (90.7 kg)       History of Present Illness: [name] is a 74 y.o. male  with a history of coronary artery disease and non-STEMI. He has had hypertension and diabetes as well. He had coronary artery bypass surgery in 2009 with LIMA to LAD, SVG to OM, SVG to diagonal and SVG to RCA. Prior angina was a heavy pressure on the chest.  Had low EF but it improved to 45% after medical therapy.  Last cardiac cath was done in 2013.:   Successful PTCA Atherectomy of the RPDA 90% lesion reducing it to ~30% using an Angiosculpt Atherectomy balloon. Successful, complex PCI on the SVG-RCA with 3 overlapping Promus Element DES, post-dilated to ~4.0 mm.  At that time, he also had a stroke.2013 carotid Doppler showed only mild plaque bilaterally.  Since the last visit, he remained active in the yard. Raking and walking in the yard.  Denies : Chest pain. Dizziness. Leg edema. Nitroglycerin use. Orthopnea. Palpitations. Paroxysmal nocturnal dyspnea. Shortness of breath. Syncope.   Has avoided COVID.  Got vaccines.  Needs booster.  Has not had flu shot.      Past Medical History:  Diagnosis Date  . Arthritis   . CAD (coronary artery disease), with CABG in 2009 after an MI 03/07/2012  . Cancer of prostate ([place])   . Complication of anesthesia    hard to intubate  . CVA (cerebral infarction)   . DM (diabetes mellitus),poorly controlled 03/07/2012  . Dyslipidemia   . Herpes zoster   . Hypertension   . Hypertensive crisis 03/07/2012  . Myocardial infarction ([place])   . Radiation 02/08/15   prostate  . Spinal stenosis 12/2016  . Unstable angina ([place]) 03/07/2012    Past  Surgical History:  Procedure Laterality Date  . 2D Echocardiogram  04/28/2012   EF 30-35%, Diffuse hypokinesis, grade one diastolic dysfunction. Mild MR  . 2D Echocardiogram-Limited  05/20/2012   Definity contrast-EF 45-50%,No LV apical thrombus  . CARDIAC CATHETERIZATION  03/07/2012   Three Promus DE stents to the SVG to RCA, PTCA atherectomy of RPDA lesion-Angiosculpt ahterectomy balloon  . CARDIAC SURGERY    . COLONOSCOPY WITH PROPOFOL N/A 08/12/2018   Procedure: COLONOSCOPY WITH PROPOFOL;  Surgeon: Wilford Corner, MD;  Location: [facility];  Service: Endoscopy;  Laterality: N/A;  . CORONARY ARTERY BYPASS GRAFT  2009   LIMA to LAD, SVG to diagonal, SVG to Circ, SVG to distal RCA.  Marland Kitchen LEFT HEART CATHETERIZATION WITH CORONARY ANGIOGRAM N/A 03/07/2012   Procedure: LEFT HEART CATHETERIZATION WITH CORONARY ANGIOGRAM;  Surgeon: Leonie Man, MD;  Location: [facility] CATH LAB;  Service: Cardiovascular;  Laterality: N/A;  . PERCUTANEOUS CORONARY STENT INTERVENTION (PCI-S) Right 03/07/2012   Procedure: PERCUTANEOUS CORONARY STENT INTERVENTION (PCI-S);  Surgeon: Leonie Man, MD;  Location: [facility] CATH LAB;  Service: Cardiovascular;  Laterality: Right;  . PERCUTANEOUS CORONARY STENT INTERVENTION (PCI-S) N/A 03/10/2012   Procedure: PERCUTANEOUS CORONARY STENT INTERVENTION (PCI-S);  Surgeon: Leonie Man, MD;  Location[facility] CATH LAB;  Service: Cardiovascular;  Laterality: N/A;  . POLYPECTOMY  08/12/2018   Procedure: POLYPECTOMY;  Surgeon: Wilford Corner,  MD;  Location: [facility];  Service: Endoscopy;;  . PROSTATE BIOPSY  03/01/14  . Renal Artery Dopplers  09/08/08   Normal renal duplex.  No diameter reduction.      Current Outpatient Medications  Medication Sig Dispense Refill  . acetaminophen (TYLENOL) 325 MG tablet Take 650 mg by mouth every 4 (four) hours as needed. For pain/headache    . amLODipine (NORVASC) 10 MG tablet Take 10 mg by mouth daily.    Marland Kitchen aspirin EC 81 MG tablet Take 81 mg by  mouth daily.    . carvedilol (COREG) 25 MG tablet Take 25 mg by mouth daily.   0  . clopidogrel (PLAVIX) 75 MG tablet Take 75 mg by mouth daily with breakfast.    . hydrALAZINE (APRESOLINE) 25 MG tablet Take 1 tablet (25 mg total) by mouth 3 (three) times daily. 90 tablet 11  . losartan (COZAAR) 100 MG tablet TAKE 1 TABLET BY MOUTH ONCE DAILY 30 tablet 3  . metFORMIN (GLUCOPHAGE) 500 MG tablet Take 500 mg by mouth 2 (two) times daily with a meal.     . nitroGLYCERIN (NITROSTAT) 0.4 MG SL tablet Place 0.4 mg under the tongue every 5 (five) minutes as needed. For chest pain    . simvastatin (ZOCOR) 80 MG tablet Take 80 mg by mouth every evening.     Marland Kitchen spironolactone (ALDACTONE) 25 MG tablet Take 1 tablet (25 mg total) by mouth daily. 30 tablet 1   No current facility-administered medications for this visit.    Allergies:   Patient has no known allergies.    Social History:  The patient  reports that he quit smoking about 38 years ago. His smoking use included cigarettes. He has a 0.75 pack-year smoking history. He has never used smokeless tobacco. He reports that he does not drink alcohol and does not use drugs.   Family History:  The patient's family history includes Heart attack in his father.    ROS:  Please see the history of present illness.   Otherwise, review of systems are positive for balance issues- uses cane.   All other systems are reviewed and negative.    PHYSICAL EXAM: VS:  BP 132/90   Pulse 65   Ht 5\' 9"  (1.753 m)   Wt 188 lb 6.4 oz (85.5 kg)   SpO2 98%   BMI 27.82 kg/m  , BMI Body mass index is 27.82 kg/m. GEN: Well nourished, well developed, in no acute distress  HEENT: normal  Neck: no JVD, carotid bruits, or masses Cardiac: RRR; no murmurs, rubs, or gallops,no edema  Respiratory:  clear to auscultation bilaterally, normal work of breathing GI: soft, nontender, nondistended, + BS MS: no deformity or atrophy  Skin: warm and dry, no rash Neuro:  Strength and  sensation are intact Psych: euthymic mood, full affect   EKG:   The ekg ordered today demonstrates NSR, inferior Q waves, NSST   Recent Labs: No results found for requested labs within last 8760 hours.   Lipid Panel    Component Value Date/Time   CHOL 147 02/28/2017 1528   TRIG 104 02/28/2017 1528   HDL 35 (L) 02/28/2017 1528   CHOLHDL 4.2 02/28/2017 1528   VLDL 21 02/28/2017 1528   LDLCALC 91 02/28/2017 1528     Other studies Reviewed: Additional studies/ records that were reviewed today with results demonstrating: labs reviewed- LDL 98 in 2021.   ASSESSMENT AND PLAN:  1.   CAD: No angina. Continue aggressive secondary  prevention. Clopidogrel monotherapy. No bleeding issues.  Refill SL NTG. 2. HTN: The current medical regimen is effective;  continue present plan and medications.   Limit salt intake. On several BP meds. 3. DM: A1C 6.5.  Controlled.  Whole food plant based diet.  4. CRI: Cr 1.53.  Avoid NSAIDs.  Use tylenol for pain as needed.  Stay hydrated.  5. Hyperlipidemia: LDL above target given prior MI.  Change simvastatin to rosuvastatin 40 mg daily.  Cmet and lipids in 3 months.     Current medicines are reviewed at length with the patient today.  The patient concerns regarding his medicines were addressed.  The following changes have been made:  No change  Labs/ tests ordered today include:  No orders of the defined types were placed in this encounter.   Recommend 150 minutes/week of aerobic exercise Low fat, low carb, high fiber diet recommended  Disposition:   FU in 1 year   Signed, Larae Grooms, MD  08/02/2020 4:34 PM    [facility] [place], [place], [place]  [address] Phone: [phone]; Fax: [phone]

## 2020-08-02 ENCOUNTER — Ambulatory Visit: Payer: Medicare Other | Admitting: Interventional Cardiology

## 2020-08-02 ENCOUNTER — Encounter: Payer: Self-pay | Admitting: Interventional Cardiology

## 2020-08-02 ENCOUNTER — Other Ambulatory Visit: Payer: Self-pay

## 2020-08-02 VITALS — BP 132/90 | HR 65 | Ht 69.0 in | Wt 188.4 lb

## 2020-08-02 DIAGNOSIS — N183 Chronic kidney disease, stage 3 unspecified: Secondary | ICD-10-CM

## 2020-08-02 DIAGNOSIS — I25118 Atherosclerotic heart disease of native coronary artery with other forms of angina pectoris: Secondary | ICD-10-CM

## 2020-08-02 DIAGNOSIS — E1159 Type 2 diabetes mellitus with other circulatory complications: Secondary | ICD-10-CM | POA: Diagnosis not present

## 2020-08-02 DIAGNOSIS — I10 Essential (primary) hypertension: Secondary | ICD-10-CM | POA: Diagnosis not present

## 2020-08-02 MED ORDER — ROSUVASTATIN CALCIUM 40 MG PO TABS: 40.0000 mg | ORAL_TABLET | Freq: Every day | ORAL | 3 refills | Status: DC

## 2020-08-02 MED ORDER — NITROGLYCERIN 0.4 MG SL SUBL: 0.4000 mg | SUBLINGUAL_TABLET | SUBLINGUAL | 3 refills | Status: DC | PRN

## 2020-08-02 NOTE — Patient Instructions (Signed)
Medication Instructions:  Your physician has recommended you make the following change in your medication:   1. STOP: simvastatin  2. START: rosuvastatin (crestor) 40 mg tablet: Take 1 tablet by mouth once a day  *If you need a refill on your cardiac medications before your next appointment, please call your pharmacy*   Lab Work: Your physician recommends that you return for a FASTING lipid profile and liver function panel in 3 months  If you have labs (blood work) drawn today and your tests are completely normal, you will receive your results only by: Marland Kitchen MyChart Message (if you have MyChart) OR . A paper copy in the mail If you have any lab test that is abnormal or we need to change your treatment, we will call you to review the results.   Testing/Procedures: None  Follow-Up: At [facility], you and your health needs are our priority.  As part of our continuing mission to provide you with exceptional heart care, we have created designated Provider Care Teams.  These Care Teams include your primary Cardiologist (physician) and Advanced Practice Providers (APPs -  Physician Assistants and Nurse Practitioners) who all work together to provide you with the care you need, when you need it.  We recommend signing up for the patient portal called "MyChart".  Sign up information is provided on this After Visit Summary.  MyChart is used to connect with patients for Virtual Visits (Telemedicine).  Patients are able to view lab/test results, encounter notes, upcoming appointments, etc.  Non-urgent messages can be sent to your provider as well.   To learn more about what you can do with MyChart, go to [email or web address].    Your next appointment:   12 month(s)  The format for your next appointment:   In Person  Provider:   You may see Larae Grooms, MD or one of the following Advanced Practice Providers on your designated Care Team:    Melina Copa, PA-C  Ermalinda Barrios,  PA-C    Other Instructions None

## 2020-08-09 DIAGNOSIS — H52203 Unspecified astigmatism, bilateral: Secondary | ICD-10-CM | POA: Diagnosis not present

## 2020-08-09 DIAGNOSIS — E1136 Type 2 diabetes mellitus with diabetic cataract: Secondary | ICD-10-CM | POA: Diagnosis not present

## 2020-08-09 DIAGNOSIS — H2513 Age-related nuclear cataract, bilateral: Secondary | ICD-10-CM | POA: Diagnosis not present

## 2020-08-09 DIAGNOSIS — H524 Presbyopia: Secondary | ICD-10-CM | POA: Diagnosis not present

## 2020-08-25 DIAGNOSIS — E1122 Type 2 diabetes mellitus with diabetic chronic kidney disease: Secondary | ICD-10-CM | POA: Diagnosis not present

## 2020-08-25 DIAGNOSIS — I10 Essential (primary) hypertension: Secondary | ICD-10-CM | POA: Diagnosis not present

## 2020-08-25 DIAGNOSIS — N183 Chronic kidney disease, stage 3 unspecified: Secondary | ICD-10-CM | POA: Diagnosis not present

## 2020-08-25 DIAGNOSIS — E785 Hyperlipidemia, unspecified: Secondary | ICD-10-CM | POA: Diagnosis not present

## 2020-09-14 DIAGNOSIS — I2510 Atherosclerotic heart disease of native coronary artery without angina pectoris: Secondary | ICD-10-CM | POA: Diagnosis not present

## 2020-09-14 DIAGNOSIS — E1122 Type 2 diabetes mellitus with diabetic chronic kidney disease: Secondary | ICD-10-CM | POA: Diagnosis not present

## 2020-09-14 DIAGNOSIS — I10 Essential (primary) hypertension: Secondary | ICD-10-CM | POA: Diagnosis not present

## 2020-09-14 DIAGNOSIS — Z8546 Personal history of malignant neoplasm of prostate: Secondary | ICD-10-CM | POA: Diagnosis not present

## 2020-09-26 DIAGNOSIS — E785 Hyperlipidemia, unspecified: Secondary | ICD-10-CM | POA: Diagnosis not present

## 2020-09-26 DIAGNOSIS — N183 Chronic kidney disease, stage 3 unspecified: Secondary | ICD-10-CM | POA: Diagnosis not present

## 2020-09-26 DIAGNOSIS — I10 Essential (primary) hypertension: Secondary | ICD-10-CM | POA: Diagnosis not present

## 2020-09-26 DIAGNOSIS — E1122 Type 2 diabetes mellitus with diabetic chronic kidney disease: Secondary | ICD-10-CM | POA: Diagnosis not present

## 2020-11-02 ENCOUNTER — Encounter (INDEPENDENT_AMBULATORY_CARE_PROVIDER_SITE_OTHER): Payer: Self-pay

## 2020-11-02 ENCOUNTER — Other Ambulatory Visit: Payer: Medicare Other | Admitting: *Deleted

## 2020-11-02 ENCOUNTER — Other Ambulatory Visit: Payer: Self-pay

## 2020-11-02 DIAGNOSIS — I10 Essential (primary) hypertension: Secondary | ICD-10-CM

## 2020-11-02 DIAGNOSIS — E1159 Type 2 diabetes mellitus with other circulatory complications: Secondary | ICD-10-CM | POA: Diagnosis not present

## 2020-11-02 DIAGNOSIS — N183 Chronic kidney disease, stage 3 unspecified: Secondary | ICD-10-CM | POA: Diagnosis not present

## 2020-11-02 DIAGNOSIS — I25118 Atherosclerotic heart disease of native coronary artery with other forms of angina pectoris: Secondary | ICD-10-CM

## 2020-11-02 LAB — COMPREHENSIVE METABOLIC PANEL
ALT: 16 IU/L (ref 0–44)
AST: 16 IU/L (ref 0–40)
Albumin/Globulin Ratio: 1.4 (ref 1.2–2.2)
Albumin: 4.6 g/dL (ref 3.7–4.7)
Alkaline Phosphatase: 78 IU/L (ref 44–121)
BUN/Creatinine Ratio: 12 (ref 10–24)
BUN: 19 mg/dL (ref 8–27)
Bilirubin Total: 0.3 mg/dL (ref 0.0–1.2)
CO2: 21 mmol/L (ref 20–29)
Calcium: 10.6 mg/dL — ABNORMAL HIGH (ref 8.6–10.2)
Chloride: 104 mmol/L (ref 96–106)
Creatinine, Ser: 1.57 mg/dL — ABNORMAL HIGH (ref 0.76–1.27)
GFR calc Af Amer: 50 mL/min/{1.73_m2} — ABNORMAL LOW (ref >59)
GFR calc non Af Amer: 43 mL/min/{1.73_m2} — ABNORMAL LOW (ref >59)
Globulin, Total: 3.2 g/dL (ref 1.5–4.5)
Glucose: 143 mg/dL — ABNORMAL HIGH (ref 65–99)
Potassium: 4.2 mmol/L (ref 3.5–5.2)
Sodium: 138 mmol/L (ref 134–144)
Total Protein: 7.8 g/dL (ref 6.0–8.5)

## 2020-11-02 LAB — LIPID PANEL
Chol/HDL Ratio: 3.1 ratio (ref 0.0–5.0)
Cholesterol, Total: 131 mg/dL (ref 100–199)
HDL: 42 mg/dL (ref >39)
LDL Chol Calc (NIH): 76 mg/dL (ref 0–99)
Triglycerides: 64 mg/dL (ref 0–149)
VLDL Cholesterol Cal: 13 mg/dL (ref 5–40)

## 2020-11-18 DIAGNOSIS — I10 Essential (primary) hypertension: Secondary | ICD-10-CM | POA: Diagnosis not present

## 2020-11-18 DIAGNOSIS — I25810 Atherosclerosis of coronary artery bypass graft(s) without angina pectoris: Secondary | ICD-10-CM | POA: Diagnosis not present

## 2020-11-18 DIAGNOSIS — E1122 Type 2 diabetes mellitus with diabetic chronic kidney disease: Secondary | ICD-10-CM | POA: Diagnosis not present

## 2020-11-18 DIAGNOSIS — E785 Hyperlipidemia, unspecified: Secondary | ICD-10-CM | POA: Diagnosis not present

## 2021-01-31 DIAGNOSIS — E785 Hyperlipidemia, unspecified: Secondary | ICD-10-CM | POA: Diagnosis not present

## 2021-01-31 DIAGNOSIS — E1122 Type 2 diabetes mellitus with diabetic chronic kidney disease: Secondary | ICD-10-CM | POA: Diagnosis not present

## 2021-01-31 DIAGNOSIS — I10 Essential (primary) hypertension: Secondary | ICD-10-CM | POA: Diagnosis not present

## 2021-01-31 DIAGNOSIS — I25810 Atherosclerosis of coronary artery bypass graft(s) without angina pectoris: Secondary | ICD-10-CM | POA: Diagnosis not present

## 2021-03-22 DIAGNOSIS — E785 Hyperlipidemia, unspecified: Secondary | ICD-10-CM | POA: Diagnosis not present

## 2021-03-22 DIAGNOSIS — E1122 Type 2 diabetes mellitus with diabetic chronic kidney disease: Secondary | ICD-10-CM | POA: Diagnosis not present

## 2021-03-22 DIAGNOSIS — N183 Chronic kidney disease, stage 3 unspecified: Secondary | ICD-10-CM | POA: Diagnosis not present

## 2021-03-22 DIAGNOSIS — I10 Essential (primary) hypertension: Secondary | ICD-10-CM | POA: Diagnosis not present

## 2021-03-27 DIAGNOSIS — Z0000 Encounter for general adult medical examination without abnormal findings: Secondary | ICD-10-CM | POA: Diagnosis not present

## 2021-03-27 DIAGNOSIS — Z8546 Personal history of malignant neoplasm of prostate: Secondary | ICD-10-CM | POA: Diagnosis not present

## 2021-03-27 DIAGNOSIS — E785 Hyperlipidemia, unspecified: Secondary | ICD-10-CM | POA: Diagnosis not present

## 2021-03-27 DIAGNOSIS — I25810 Atherosclerosis of coronary artery bypass graft(s) without angina pectoris: Secondary | ICD-10-CM | POA: Diagnosis not present

## 2021-03-27 DIAGNOSIS — Z1389 Encounter for screening for other disorder: Secondary | ICD-10-CM | POA: Diagnosis not present

## 2021-03-27 DIAGNOSIS — E1122 Type 2 diabetes mellitus with diabetic chronic kidney disease: Secondary | ICD-10-CM | POA: Diagnosis not present

## 2021-07-24 ENCOUNTER — Other Ambulatory Visit: Payer: Self-pay | Admitting: Interventional Cardiology

## 2021-08-10 DIAGNOSIS — H25813 Combined forms of age-related cataract, bilateral: Secondary | ICD-10-CM | POA: Diagnosis not present

## 2021-08-10 DIAGNOSIS — E1136 Type 2 diabetes mellitus with diabetic cataract: Secondary | ICD-10-CM | POA: Diagnosis not present

## 2021-08-10 DIAGNOSIS — H52203 Unspecified astigmatism, bilateral: Secondary | ICD-10-CM | POA: Diagnosis not present

## 2021-08-10 DIAGNOSIS — H524 Presbyopia: Secondary | ICD-10-CM | POA: Diagnosis not present

## 2021-08-21 ENCOUNTER — Other Ambulatory Visit: Payer: Self-pay | Admitting: Interventional Cardiology

## 2021-09-24 NOTE — Progress Notes (Signed)
Cardiology Office Note   Date:  09/26/2021   ID:  [name], DOB 15-Dec-1945, MRN [id number]  PCP:  Leeroy Cha, MD    Chief Complaint  Patient presents with   Follow-up   CAD  Wt Readings from Last 3 Encounters:  09/26/21 179 lb (81.2 kg)  08/02/20 188 lb 6.4 oz (85.5 kg)  05/05/19 195 lb 12.8 oz (88.8 kg)       History of Present Illness: [name] is a 76 y.o. male   with a history of coronary artery disease and non-STEMI.  He has had hypertension and diabetes as well.  He had coronary artery bypass surgery in 2009 with LIMA to LAD, SVG to OM, SVG to diagonal and SVG to RCA.  Prior angina was a heavy pressure on the chest.   Had low EF but it improved to 45% after medical therapy.   Last cardiac cath was done in 2013.  :   Successful PTCA Atherectomy of the RPDA 90% lesion reducing it to ~30% using an Angiosculpt Atherectomy balloon. Successful, complex PCI on the SVG-RCA with 3 overlapping Promus Element DES, post-dilated to ~4.0 mm.    At that time, he also had a stroke.  2013 carotid Doppler showed only mild plaque bilaterally.  Denies : Chest pain. Dizziness. Nitroglycerin use. Orthopnea. Palpitations. Paroxysmal nocturnal dyspnea. Shortness of breath. Syncope.    Occasional leg edema.  Minimizes red meat. More chicken and fish.  More vegetable.   Walks nearly every day.     Past Medical History:  Diagnosis Date   Arthritis    CAD (coronary artery disease), with CABG in 2009 after an MI 03/07/2012   Cancer of prostate ([place])    Complication of anesthesia    hard to intubate   CVA (cerebral infarction)    DM (diabetes mellitus),poorly controlled 03/07/2012   Dyslipidemia    Herpes zoster    Hypertension    Hypertensive crisis 03/07/2012   Myocardial infarction [facility])    Radiation 02/08/15   prostate   Spinal stenosis 12/2016   Unstable angina ([place]) 03/07/2012    Past Surgical History:  Procedure Laterality Date   2D  Echocardiogram  04/28/2012   EF 30-35%, Diffuse hypokinesis, grade one diastolic dysfunction. Mild MR   2D Echocardiogram-Limited  05/20/2012   Definity contrast-EF 45-50%,No LV apical thrombus   CARDIAC CATHETERIZATION  03/07/2012   Three Promus DE stents to the SVG to RCA, PTCA atherectomy of RPDA lesion-Angiosculpt ahterectomy balloon   CARDIAC SURGERY     COLONOSCOPY WITH PROPOFOL N/A 08/12/2018   Procedure: COLONOSCOPY WITH PROPOFOL;  Surgeon: Wilford Corner, MD;  Location: [facility];  Service: Endoscopy;  Laterality: N/A;   CORONARY ARTERY BYPASS GRAFT  2009   LIMA to LAD, SVG to diagonal, SVG to Circ, SVG to distal RCA.   LEFT HEART CATHETERIZATION WITH CORONARY ANGIOGRAM N/A 03/07/2012   Procedure: LEFT HEART CATHETERIZATION WITH CORONARY ANGIOGRAM;  Surgeon: Leonie Man, MD;  Location: [facility] CATH LAB;  Service: Cardiovascular;  Laterality: N/A;   PERCUTANEOUS CORONARY STENT INTERVENTION (PCI-S) Right 03/07/2012   Procedure: PERCUTANEOUS CORONARY STENT INTERVENTION (PCI-S);  Surgeon: Leonie Man, MD;  Location: [facility] CATH LAB;  Service: Cardiovascular;  Laterality: Right;   PERCUTANEOUS CORONARY STENT INTERVENTION (PCI-S) N/A 03/10/2012   Procedure: PERCUTANEOUS CORONARY STENT INTERVENTION (PCI-S);  Surgeon: Leonie Man, MD;  Location: [facility] CATH LAB;  Service: Cardiovascular;  Laterality: N/A;   POLYPECTOMY  08/12/2018   Procedure: POLYPECTOMY;  Surgeon: Wilford Corner, MD;  Location: [facility];  Service: Endoscopy;;   PROSTATE BIOPSY  03/01/14   Renal Artery Dopplers  09/08/08   Normal renal duplex.  No diameter reduction.      Current Outpatient Medications  Medication Sig Dispense Refill   acetaminophen (TYLENOL) 325 MG tablet Take 650 mg by mouth every 4 (four) hours as needed. For pain/headache     amLODipine (NORVASC) 10 MG tablet Take 10 mg by mouth daily.     aspirin EC 81 MG tablet Take 81 mg by mouth daily.     carvedilol (COREG) 25 MG tablet Take 25 mg by mouth  daily.   0   clopidogrel (PLAVIX) 75 MG tablet Take 75 mg by mouth daily with breakfast.     hydrALAZINE (APRESOLINE) 25 MG tablet Take 1 tablet (25 mg total) by mouth 3 (three) times daily. 90 tablet 11   losartan (COZAAR) 100 MG tablet TAKE 1 TABLET BY MOUTH ONCE DAILY 30 tablet 3   metFORMIN (GLUCOPHAGE) 500 MG tablet Take 500 mg by mouth 2 (two) times daily with a meal.      nitroGLYCERIN (NITROSTAT) 0.4 MG SL tablet Place 1 tablet (0.4 mg total) under the tongue every 5 (five) minutes as needed. For chest pain 25 tablet 3   rosuvastatin (CRESTOR) 40 MG tablet Take 1 tablet by mouth once daily 90 tablet 0   spironolactone (ALDACTONE) 25 MG tablet Take 1 tablet (25 mg total) by mouth daily. 30 tablet 1   No current facility-administered medications for this visit.    Allergies:   Patient has no known allergies.    Social History:  The patient  reports that he quit smoking about 40 years ago. His smoking use included cigarettes. He has a 0.75 pack-year smoking history. He has never used smokeless tobacco. He reports that he does not drink alcohol and does not use drugs.   Family History:  The patient's family history includes Heart attack in his father.    ROS:  Please see the history of present illness.   Otherwise, review of systems are positive for uses cane for balance.   All other systems are reviewed and negative.    PHYSICAL EXAM: VS:  BP 122/74    Pulse 75    Ht 5\' 8"  (1.727 m)    Wt 179 lb (81.2 kg)    SpO2 99%    BMI 27.22 kg/m  , BMI Body mass index is 27.22 kg/m. GEN: Well nourished, well developed, in no acute distress HEENT: normal Neck: no JVD, carotid bruits, or masses Cardiac: RRR; 2/6 early systolic murmur, no rubs, or gallops,no edema  Respiratory:  clear to auscultation bilaterally, normal work of breathing GI: soft, nontender, nondistended, + BS MS: no deformity or atrophy Skin: warm and dry, no rash Neuro:  Strength and sensation are intact, slow gait, walks  with cane Psych: euthymic mood, full affect   EKG:   The ekg ordered today demonstrates NSR, inferior Q waves, no change from prior   Recent Labs: 11/02/2020: ALT 16; BUN 19; Creatinine, Ser 1.57; Potassium 4.2; Sodium 138   Lipid Panel    Component Value Date/Time   CHOL 131 11/02/2020 0837   TRIG 64 11/02/2020 0837   HDL 42 11/02/2020 0837   CHOLHDL 3.1 11/02/2020 0837   CHOLHDL 4.2 02/28/2017 1528   VLDL 21 02/28/2017 1528   LDLCALC 76 11/02/2020 0837     Other studies Reviewed: Additional studies/ records that were reviewed  today with results demonstrating: labs reviewed.  Echo showed LVEF 45% in 2013.    ASSESSMENT AND PLAN:  CAD: Whole food, plant-based diet.High-fiber diet.  CABG in 2009 and PCI in 2013.  No bleeding problems.  Refill nitroglycerin as well HTN: The current medical regimen is effective;  continue present plan and medications.  250I systolic at home.  DM: A1C 6.0 in July 2022.  Continue metformin.  CRI: Creatinine 1.9 in July 2022.  Avoiding NSAIDs like Advil, ibuprofen, Aleve.  Stay Well-hydrated.   Hyperlipidemia: LDL 76 in July 2022.  Liver tests normal.  Target LDL 55 with new guidelines given his prior MI and diabetes.  Will add ezetemibe 10 mg daily.  Check lipids and liver in 3 months.  Stressed the importance of healthy diet as well.   Current medicines are reviewed at length with the patient today.  The patient concerns regarding his medicines were addressed.  The following changes have been made: Add Zetia  Labs/ tests ordered today include: Liver, lipids as noted above No orders of the defined types were placed in this encounter.   Recommend 150 minutes/week of aerobic exercise Low fat, low carb, high fiber diet recommended  Disposition:   FU in 1 year   Signed, Larae Grooms, MD  09/26/2021 10:15 AM    [address], [place], [place]  [address] Phone: [phone]; Fax: [phone]

## 2021-09-26 ENCOUNTER — Ambulatory Visit: Payer: Medicare Other | Admitting: Interventional Cardiology

## 2021-09-26 ENCOUNTER — Other Ambulatory Visit: Payer: Self-pay

## 2021-09-26 ENCOUNTER — Encounter (INDEPENDENT_AMBULATORY_CARE_PROVIDER_SITE_OTHER): Payer: Self-pay

## 2021-09-26 ENCOUNTER — Encounter: Payer: Self-pay | Admitting: Interventional Cardiology

## 2021-09-26 VITALS — BP 122/74 | HR 75 | Ht 68.0 in | Wt 179.0 lb

## 2021-09-26 DIAGNOSIS — I10 Essential (primary) hypertension: Secondary | ICD-10-CM

## 2021-09-26 DIAGNOSIS — N183 Chronic kidney disease, stage 3 unspecified: Secondary | ICD-10-CM | POA: Diagnosis not present

## 2021-09-26 DIAGNOSIS — E1159 Type 2 diabetes mellitus with other circulatory complications: Secondary | ICD-10-CM

## 2021-09-26 DIAGNOSIS — E1122 Type 2 diabetes mellitus with diabetic chronic kidney disease: Secondary | ICD-10-CM | POA: Diagnosis not present

## 2021-09-26 DIAGNOSIS — E782 Mixed hyperlipidemia: Secondary | ICD-10-CM

## 2021-09-26 DIAGNOSIS — I25118 Atherosclerotic heart disease of native coronary artery with other forms of angina pectoris: Secondary | ICD-10-CM | POA: Diagnosis not present

## 2021-09-26 DIAGNOSIS — E785 Hyperlipidemia, unspecified: Secondary | ICD-10-CM | POA: Diagnosis not present

## 2021-09-26 DIAGNOSIS — Z7984 Long term (current) use of oral hypoglycemic drugs: Secondary | ICD-10-CM | POA: Diagnosis not present

## 2021-09-26 MED ORDER — NITROGLYCERIN 0.4 MG SL SUBL: 0.4000 mg | SUBLINGUAL_TABLET | SUBLINGUAL | 3 refills | Status: AC | PRN

## 2021-09-26 MED ORDER — EZETIMIBE 10 MG PO TABS: 10.0000 mg | ORAL_TABLET | Freq: Every day | ORAL | 3 refills | Status: DC

## 2021-09-26 NOTE — Patient Instructions (Signed)
Medication Instructions:  Your physician has recommended you make the following change in your medication: Start ezetimibe 10 mg by mouth daily  *If you need a refill on your cardiac medications before your next appointment, please call your pharmacy*   Lab Work: Your physician recommends that you return for lab work on December 25, 2021.  Lipid and liver profiles.  This will be fasting.  The lab opens at 7:30 AM  If you have labs (blood work) drawn today and your tests are completely normal, you will receive your results only by: [place] (if you have MyChart) OR A paper copy in the mail If you have any lab test that is abnormal or we need to change your treatment, we will call you to review the results.   Testing/Procedures: none   Follow-Up: At [facility], you and your health needs are our priority.  As part of our continuing mission to provide you with exceptional heart care, we have created designated Provider Care Teams.  These Care Teams include your primary Cardiologist (physician) and Advanced Practice Providers (APPs -  Physician Assistants and Nurse Practitioners) who all work together to provide you with the care you need, when you need it.  We recommend signing up for the patient portal called "MyChart".  Sign up information is provided on this After Visit Summary.  MyChart is used to connect with patients for Virtual Visits (Telemedicine).  Patients are able to view lab/test results, encounter notes, upcoming appointments, etc.  Non-urgent messages can be sent to your provider as well.   To learn more about what you can do with MyChart, go to [email or web address].    Your next appointment:   12 month(s)  The format for your next appointment:   In Person  Provider:   Larae Grooms, MD     Other Instructions High-Fiber Eating Plan Fiber, also called dietary fiber, is a type of carbohydrate. It is found foods such as fruits, vegetables, whole grains, and  beans. A high-fiber diet can have many health benefits. Your health care provider may recommend a high-fiber diet to help: Prevent constipation. Fiber can make your bowel movements more regular. Lower your cholesterol. Relieve the following conditions: Inflammation of veins in the anus (hemorrhoids). Inflammation of specific areas of the digestive tract (uncomplicated diverticulosis). A problem of the large intestine, also called the colon, that sometimes causes pain and diarrhea (irritable bowel syndrome, or IBS). Prevent overeating as part of a weight-loss plan. Prevent heart disease, type 2 diabetes, and certain cancers. What are tips for following this plan? Reading food labels  Check the nutrition facts label on food products for the amount of dietary fiber. Choose foods that have 5 grams of fiber or more per serving. The goals for recommended daily fiber intake include: Men (age 69 or younger): 34-38 g. Men (over age 33): 28-34 g. Women (age 71 or younger): 25-28 g. Women (over age 56): 22-25 g. Your daily fiber goal is _____________ g. Shopping Choose whole fruits and vegetables instead of processed forms, such as apple juice or applesauce. Choose a wide variety of high-fiber foods such as avocados, lentils, oats, and kidney beans. Read the nutrition facts label of the foods you choose. Be aware of foods with added fiber. These foods often have high sugar and sodium amounts per serving. Cooking Use whole-grain flour for baking and cooking. Cook with brown rice instead of white rice. Meal planning Start the day with a breakfast that is high in fiber, such  as a cereal that contains 5 g of fiber or more per serving. Eat breads and cereals that are made with whole-grain flour instead of refined flour or white flour. Eat brown rice, bulgur wheat, or millet instead of white rice. Use beans in place of meat in soups, salads, and pasta dishes. Be sure that half of the grains you eat  each day are whole grains. General information You can get the recommended daily intake of dietary fiber by: Eating a variety of fruits, vegetables, grains, nuts, and beans. Taking a fiber supplement if you are not able to take in enough fiber in your diet. It is better to get fiber through food than from a supplement. Gradually increase how much fiber you consume. If you increase your intake of dietary fiber too quickly, you may have bloating, cramping, or gas. Drink plenty of water to help you digest fiber. Choose high-fiber snacks, such as berries, raw vegetables, nuts, and popcorn. What foods should I eat? Fruits Berries. Pears. Apples. Oranges. Avocado. Prunes and raisins. Dried figs. Vegetables Sweet potatoes. Spinach. Kale. Artichokes. Cabbage. Broccoli. Cauliflower. Green peas. Carrots. Squash. Grains Whole-grain breads. Multigrain cereal. Oats and oatmeal. Brown rice. Barley. Bulgur wheat. [place]. Quinoa. Bran muffins. Popcorn. Rye wafer crackers. Meats and other proteins Navy beans, kidney beans, and pinto beans. Soybeans. Split peas. Lentils. Nuts and seeds. Dairy Fiber-fortified yogurt. Beverages Fiber-fortified soy milk. Fiber-fortified orange juice. Other foods Fiber bars. The items listed above may not be a complete list of recommended foods and beverages. Contact a dietitian for more information. What foods should I avoid? Fruits Fruit juice. Cooked, strained fruit. Vegetables Fried potatoes. Canned vegetables. Well-cooked vegetables. Grains White bread. Pasta made with refined flour. White rice. Meats and other proteins Fatty cuts of meat. Fried chicken or fried fish. Dairy Milk. Yogurt. Cream cheese. Sour cream. Fats and oils Butters. Beverages Soft drinks. Other foods Cakes and pastries. The items listed above may not be a complete list of foods and beverages to avoid. Talk with your dietitian about what choices are best for you. Summary Fiber is a type  of carbohydrate. It is found in foods such as fruits, vegetables, whole grains, and beans. A high-fiber diet has many benefits. It can help to prevent constipation, lower blood cholesterol, aid weight loss, and reduce your risk of heart disease, diabetes, and certain cancers. Increase your intake of fiber gradually. Increasing fiber too quickly may cause cramping, bloating, and gas. Drink plenty of water while you increase the amount of fiber you consume. The best sources of fiber include whole fruits and vegetables, whole grains, nuts, seeds, and beans. This information is not intended to replace advice given to you by your health care provider. Make sure you discuss any questions you have with your health care provider. Document Revised: 12/31/2019 Document Reviewed: 12/31/2019 Elsevier Patient Education  2022 [employer].

## 2021-11-21 ENCOUNTER — Other Ambulatory Visit: Payer: Self-pay | Admitting: Interventional Cardiology

## 2021-12-25 ENCOUNTER — Other Ambulatory Visit: Payer: Medicare Other

## 2021-12-25 DIAGNOSIS — I25118 Atherosclerotic heart disease of native coronary artery with other forms of angina pectoris: Secondary | ICD-10-CM | POA: Diagnosis not present

## 2021-12-25 DIAGNOSIS — E782 Mixed hyperlipidemia: Secondary | ICD-10-CM

## 2021-12-25 LAB — LIPID PANEL
Chol/HDL Ratio: 4 ratio (ref 0.0–5.0)
Cholesterol, Total: 85 mg/dL — ABNORMAL LOW (ref 100–199)
HDL: 21 mg/dL — ABNORMAL LOW (ref >39)
LDL Chol Calc (NIH): 46 mg/dL (ref 0–99)
Triglycerides: 86 mg/dL (ref 0–149)
VLDL Cholesterol Cal: 18 mg/dL (ref 5–40)

## 2021-12-25 LAB — HEPATIC FUNCTION PANEL
ALT: 222 IU/L — ABNORMAL HIGH (ref 0–44)
AST: 188 IU/L — ABNORMAL HIGH (ref 0–40)
Albumin: 4.2 g/dL (ref 3.7–4.7)
Alkaline Phosphatase: 76 IU/L (ref 44–121)
Bilirubin Total: 0.6 mg/dL (ref 0.0–1.2)
Bilirubin, Direct: 0.18 mg/dL (ref 0.00–0.40)
Total Protein: 6.9 g/dL (ref 6.0–8.5)

## 2021-12-26 ENCOUNTER — Telehealth: Payer: Self-pay | Admitting: *Deleted

## 2021-12-26 DIAGNOSIS — E782 Mixed hyperlipidemia: Secondary | ICD-10-CM

## 2021-12-26 DIAGNOSIS — R7989 Other specified abnormal findings of blood chemistry: Secondary | ICD-10-CM

## 2021-12-26 NOTE — Telephone Encounter (Signed)
-----   Message from Jettie Booze, MD sent at 12/25/2021  6:12 PM EDT ----- ?LDL improved with addition of Zetia in Jan 2023, but LFTs now increased compared to Feb 2022.  Would hold Zetia and recheck lipids and liver tests in 6-8 weeks.  May need to consider other lipid lowering drugs. LFTs were normal on rosuvastatin in the past so would continue rosuvastatin.    ?

## 2021-12-26 NOTE — Telephone Encounter (Signed)
Patient notified.  Confirmed he is only taking Rosuvastatin and Ezetimibe for cholesterol management. He will stop Ezetimibe and come in for fasting lab work on February 20, 2022 ?

## 2021-12-29 ENCOUNTER — Telehealth: Payer: Self-pay | Admitting: Interventional Cardiology

## 2021-12-29 DIAGNOSIS — I10 Essential (primary) hypertension: Secondary | ICD-10-CM | POA: Diagnosis not present

## 2021-12-29 DIAGNOSIS — I25810 Atherosclerosis of coronary artery bypass graft(s) without angina pectoris: Secondary | ICD-10-CM | POA: Diagnosis not present

## 2021-12-29 DIAGNOSIS — E785 Hyperlipidemia, unspecified: Secondary | ICD-10-CM | POA: Diagnosis not present

## 2021-12-29 DIAGNOSIS — E1122 Type 2 diabetes mellitus with diabetic chronic kidney disease: Secondary | ICD-10-CM | POA: Diagnosis not present

## 2021-12-29 NOTE — Telephone Encounter (Signed)
Routed last OV note to PCP via Epic.  ?

## 2021-12-29 NOTE — Telephone Encounter (Signed)
Office is calling to get patient last office visit notes fax over to them. Fax [phone] or [phone]. Please advise ?

## 2022-02-20 ENCOUNTER — Other Ambulatory Visit: Payer: Medicare Other

## 2022-02-20 DIAGNOSIS — E782 Mixed hyperlipidemia: Secondary | ICD-10-CM

## 2022-02-20 DIAGNOSIS — R7989 Other specified abnormal findings of blood chemistry: Secondary | ICD-10-CM

## 2022-02-20 LAB — LIPID PANEL
Chol/HDL Ratio: 3.4 ratio (ref 0.0–5.0)
Cholesterol, Total: 94 mg/dL — ABNORMAL LOW (ref 100–199)
HDL: 28 mg/dL — ABNORMAL LOW (ref >39)
LDL Chol Calc (NIH): 53 mg/dL (ref 0–99)
Triglycerides: 55 mg/dL (ref 0–149)
VLDL Cholesterol Cal: 13 mg/dL (ref 5–40)

## 2022-02-20 LAB — HEPATIC FUNCTION PANEL
ALT: 16 IU/L (ref 0–44)
AST: 18 IU/L (ref 0–40)
Albumin: 4.2 g/dL (ref 3.7–4.7)
Alkaline Phosphatase: 69 IU/L (ref 44–121)
Bilirubin Total: 0.3 mg/dL (ref 0.0–1.2)
Bilirubin, Direct: 0.11 mg/dL (ref 0.00–0.40)
Total Protein: 7.1 g/dL (ref 6.0–8.5)

## 2022-04-04 DIAGNOSIS — I10 Essential (primary) hypertension: Secondary | ICD-10-CM | POA: Diagnosis not present

## 2022-04-04 DIAGNOSIS — Z8546 Personal history of malignant neoplasm of prostate: Secondary | ICD-10-CM | POA: Diagnosis not present

## 2022-04-04 DIAGNOSIS — Z0000 Encounter for general adult medical examination without abnormal findings: Secondary | ICD-10-CM | POA: Diagnosis not present

## 2022-04-04 DIAGNOSIS — E1122 Type 2 diabetes mellitus with diabetic chronic kidney disease: Secondary | ICD-10-CM | POA: Diagnosis not present

## 2022-04-04 DIAGNOSIS — I25810 Atherosclerosis of coronary artery bypass graft(s) without angina pectoris: Secondary | ICD-10-CM | POA: Diagnosis not present

## 2022-04-04 DIAGNOSIS — Z1331 Encounter for screening for depression: Secondary | ICD-10-CM | POA: Diagnosis not present

## 2022-04-04 DIAGNOSIS — E785 Hyperlipidemia, unspecified: Secondary | ICD-10-CM | POA: Diagnosis not present

## 2022-06-25 DIAGNOSIS — N184 Chronic kidney disease, stage 4 (severe): Secondary | ICD-10-CM | POA: Diagnosis not present

## 2022-06-25 DIAGNOSIS — D631 Anemia in chronic kidney disease: Secondary | ICD-10-CM | POA: Diagnosis not present

## 2022-06-25 DIAGNOSIS — N189 Chronic kidney disease, unspecified: Secondary | ICD-10-CM | POA: Diagnosis not present

## 2022-06-25 DIAGNOSIS — I129 Hypertensive chronic kidney disease with stage 1 through stage 4 chronic kidney disease, or unspecified chronic kidney disease: Secondary | ICD-10-CM | POA: Diagnosis not present

## 2022-06-25 DIAGNOSIS — N2581 Secondary hyperparathyroidism of renal origin: Secondary | ICD-10-CM | POA: Diagnosis not present

## 2022-08-13 DIAGNOSIS — H524 Presbyopia: Secondary | ICD-10-CM | POA: Diagnosis not present

## 2022-08-13 DIAGNOSIS — H52203 Unspecified astigmatism, bilateral: Secondary | ICD-10-CM | POA: Diagnosis not present

## 2022-08-13 DIAGNOSIS — H25813 Combined forms of age-related cataract, bilateral: Secondary | ICD-10-CM | POA: Diagnosis not present

## 2022-08-13 DIAGNOSIS — E1136 Type 2 diabetes mellitus with diabetic cataract: Secondary | ICD-10-CM | POA: Diagnosis not present

## 2022-08-13 DIAGNOSIS — N184 Chronic kidney disease, stage 4 (severe): Secondary | ICD-10-CM | POA: Diagnosis not present

## 2022-08-21 ENCOUNTER — Other Ambulatory Visit: Payer: Self-pay | Admitting: Nephrology

## 2022-08-21 DIAGNOSIS — N2581 Secondary hyperparathyroidism of renal origin: Secondary | ICD-10-CM

## 2022-08-21 DIAGNOSIS — D631 Anemia in chronic kidney disease: Secondary | ICD-10-CM | POA: Diagnosis not present

## 2022-08-21 DIAGNOSIS — I129 Hypertensive chronic kidney disease with stage 1 through stage 4 chronic kidney disease, or unspecified chronic kidney disease: Secondary | ICD-10-CM | POA: Diagnosis not present

## 2022-08-21 DIAGNOSIS — N184 Chronic kidney disease, stage 4 (severe): Secondary | ICD-10-CM | POA: Diagnosis not present

## 2022-08-29 ENCOUNTER — Ambulatory Visit
Admission: RE | Admit: 2022-08-29 | Discharge: 2022-08-29 | Disposition: A | Discharge status: Home / Self Care | Payer: Medicare Other | Source: Ambulatory Visit | Attending: Nephrology | Admitting: Nephrology

## 2022-08-29 DIAGNOSIS — N2581 Secondary hyperparathyroidism of renal origin: Secondary | ICD-10-CM

## 2022-08-29 DIAGNOSIS — I129 Hypertensive chronic kidney disease with stage 1 through stage 4 chronic kidney disease, or unspecified chronic kidney disease: Secondary | ICD-10-CM

## 2022-08-29 DIAGNOSIS — N189 Chronic kidney disease, unspecified: Secondary | ICD-10-CM | POA: Diagnosis not present

## 2022-08-29 DIAGNOSIS — N184 Chronic kidney disease, stage 4 (severe): Secondary | ICD-10-CM

## 2022-08-29 DIAGNOSIS — D631 Anemia in chronic kidney disease: Secondary | ICD-10-CM

## 2022-09-17 DIAGNOSIS — N184 Chronic kidney disease, stage 4 (severe): Secondary | ICD-10-CM | POA: Diagnosis not present

## 2022-09-26 DIAGNOSIS — D631 Anemia in chronic kidney disease: Secondary | ICD-10-CM | POA: Diagnosis not present

## 2022-09-26 DIAGNOSIS — I129 Hypertensive chronic kidney disease with stage 1 through stage 4 chronic kidney disease, or unspecified chronic kidney disease: Secondary | ICD-10-CM | POA: Diagnosis not present

## 2022-09-26 DIAGNOSIS — N184 Chronic kidney disease, stage 4 (severe): Secondary | ICD-10-CM | POA: Diagnosis not present

## 2022-09-26 DIAGNOSIS — N2581 Secondary hyperparathyroidism of renal origin: Secondary | ICD-10-CM | POA: Diagnosis not present

## 2022-10-24 DIAGNOSIS — N184 Chronic kidney disease, stage 4 (severe): Secondary | ICD-10-CM | POA: Diagnosis not present

## 2022-10-24 DIAGNOSIS — E559 Vitamin D deficiency, unspecified: Secondary | ICD-10-CM | POA: Diagnosis not present

## 2022-10-24 DIAGNOSIS — I10 Essential (primary) hypertension: Secondary | ICD-10-CM | POA: Diagnosis not present

## 2022-10-24 DIAGNOSIS — N2581 Secondary hyperparathyroidism of renal origin: Secondary | ICD-10-CM | POA: Diagnosis not present

## 2022-10-24 DIAGNOSIS — E1122 Type 2 diabetes mellitus with diabetic chronic kidney disease: Secondary | ICD-10-CM | POA: Diagnosis not present

## 2022-11-21 ENCOUNTER — Other Ambulatory Visit: Payer: Self-pay | Admitting: Interventional Cardiology

## 2022-12-26 DIAGNOSIS — I129 Hypertensive chronic kidney disease with stage 1 through stage 4 chronic kidney disease, or unspecified chronic kidney disease: Secondary | ICD-10-CM | POA: Diagnosis not present

## 2022-12-26 DIAGNOSIS — N2581 Secondary hyperparathyroidism of renal origin: Secondary | ICD-10-CM | POA: Diagnosis not present

## 2022-12-26 DIAGNOSIS — N189 Chronic kidney disease, unspecified: Secondary | ICD-10-CM | POA: Diagnosis not present

## 2022-12-26 DIAGNOSIS — N184 Chronic kidney disease, stage 4 (severe): Secondary | ICD-10-CM | POA: Diagnosis not present

## 2022-12-26 DIAGNOSIS — D631 Anemia in chronic kidney disease: Secondary | ICD-10-CM | POA: Diagnosis not present

## 2022-12-27 LAB — LAB REPORT - SCANNED: EGFR: 21

## 2023-01-07 ENCOUNTER — Other Ambulatory Visit: Payer: Self-pay | Admitting: Interventional Cardiology

## 2023-01-11 ENCOUNTER — Encounter: Payer: Self-pay | Admitting: Internal Medicine

## 2023-02-15 ENCOUNTER — Other Ambulatory Visit: Payer: Self-pay | Admitting: Interventional Cardiology

## 2023-02-16 ENCOUNTER — Other Ambulatory Visit: Payer: Self-pay | Admitting: Interventional Cardiology

## 2023-03-06 NOTE — Progress Notes (Unsigned)
Cardiology Office Note   Date:  03/07/2023   ID:  [name], DOB [birth year], MRN [id number]  PCP:  Lorenda Ishihara, MD    No chief complaint on file.  CAD  Wt Readings from Last 3 Encounters:  03/07/23 166 lb 9.6 oz (75.6 kg)  09/26/21 179 lb (81.2 kg)  08/02/20 188 lb 6.4 oz (85.5 kg)       History of Present Illness: [name] is a 77 y.o. male   with a history of coronary artery disease and non-STEMI.  He has had hypertension and diabetes as well.  He had coronary artery bypass surgery in 2009 with LIMA to LAD, SVG to OM, SVG to diagonal and SVG to RCA.  Prior angina was a heavy pressure on the chest.   Had low EF but it improved to 45% after medical therapy.   Last cardiac cath was done in 2013.  :   Successful PTCA Atherectomy of the RPDA 90% lesion reducing it to ~30% using an Angiosculpt Atherectomy balloon. Successful, complex PCI on the SVG-RCA with 3 overlapping Promus Element DES, post-dilated to ~4.0 mm.    At that time, he also had a stroke.  2013 carotid Doppler showed only mild plaque bilaterally.  Blood pressures at home in the 120s to 130s range systolic.  Denies : Chest pain. Dizziness. Leg edema. Nitroglycerin use. Orthopnea. Palpitations. Paroxysmal nocturnal dyspnea. Shortness of breath. Syncope.    Continues to lose weight. Metformin was stopped.     Past Medical History:  Diagnosis Date   Arthritis    CAD (coronary artery disease), with CABG in 2009 after an MI 03/07/2012   Cancer of prostate ([facility])    Complication of anesthesia    hard to intubate   CVA (cerebral infarction)    DM (diabetes mellitus),poorly controlled 03/07/2012   Dyslipidemia    Herpes zoster    Hypertension    Hypertensive crisis 03/07/2012   Myocardial infarction [facility])    Radiation 02/08/15   prostate   Spinal stenosis 12/2016   Unstable angina ([facility]) 03/07/2012    Past Surgical History:  Procedure Laterality Date   2D Echocardiogram   04/28/2012   EF 30-35%, Diffuse hypokinesis, grade one diastolic dysfunction. Mild MR   2D Echocardiogram-Limited  05/20/2012   Definity contrast-EF 45-50%,No LV apical thrombus   CARDIAC CATHETERIZATION  03/07/2012   Three Promus DE stents to the SVG to RCA, PTCA atherectomy of RPDA lesion-Angiosculpt ahterectomy balloon   CARDIAC SURGERY     COLONOSCOPY WITH PROPOFOL N/A 08/12/2018   Procedure: COLONOSCOPY WITH PROPOFOL;  Surgeon: Charlott Rakes, MD;  Location: [facility];  Service: Endoscopy;  Laterality: N/A;   CORONARY ARTERY BYPASS GRAFT  2009   LIMA to LAD, SVG to diagonal, SVG to Circ, SVG to distal RCA.   LEFT HEART CATHETERIZATION WITH CORONARY ANGIOGRAM N/A 03/07/2012   Procedure: LEFT HEART CATHETERIZATION WITH CORONARY ANGIOGRAM;  Surgeon: Marykay Lex, MD;  Location: [facility] CATH LAB;  Service: Cardiovascular;  Laterality: N/A;   PERCUTANEOUS CORONARY STENT INTERVENTION (PCI-S) Right 03/07/2012   Procedure: PERCUTANEOUS CORONARY STENT INTERVENTION (PCI-S);  Surgeon: Marykay Lex, MD;  Location: [facility] CATH LAB;  Service: Cardiovascular;  Laterality: Right;   PERCUTANEOUS CORONARY STENT INTERVENTION (PCI-S) N/A 03/10/2012   Procedure: PERCUTANEOUS CORONARY STENT INTERVENTION (PCI-S);  Surgeon: Marykay Lex, MD;  Location: [facility] CATH LAB;  Service: Cardiovascular;  Laterality: N/A;   POLYPECTOMY  08/12/2018   Procedure: POLYPECTOMY;  Surgeon: Charlott Rakes, MD;  [facility]: [facility];  Service: Endoscopy;;   PROSTATE BIOPSY  03/01/14   Renal Artery Dopplers  09/08/08   Normal renal duplex.  No diameter reduction.      Current Outpatient Medications  Medication Sig Dispense Refill   acetaminophen (TYLENOL) 325 MG tablet Take 650 mg by mouth every 4 (four) hours as needed. For pain/headache     amLODipine (NORVASC) 10 MG tablet Take 10 mg by mouth daily.     aspirin EC 81 MG tablet Take 81 mg by mouth daily.     carvedilol (COREG) 25 MG tablet Take 25 mg by mouth daily.   0    clopidogrel (PLAVIX) 75 MG tablet Take 75 mg by mouth daily with breakfast.     hydrALAZINE (APRESOLINE) 25 MG tablet Take 1 tablet (25 mg total) by mouth 3 (three) times daily. 90 tablet 11   losartan (COZAAR) 100 MG tablet TAKE 1 TABLET BY MOUTH ONCE DAILY 30 tablet 3   nitroGLYCERIN (NITROSTAT) 0.4 MG SL tablet Place 1 tablet (0.4 mg total) under the tongue every 5 (five) minutes as needed. For chest pain 25 tablet 3   rosuvastatin (CRESTOR) 40 MG tablet Take 1 tablet (40 mg total) by mouth daily. Please call [phone] to schedule an overdue appointment for future refills. Thank you. Final attempt. 15 tablet 0   spironolactone (ALDACTONE) 25 MG tablet Take 1 tablet (25 mg total) by mouth daily. 30 tablet 1   No current facility-administered medications for this visit.    Allergies:   Patient has no known allergies.    Social History:  The patient  reports that he quit smoking about 41 years ago. His smoking use included cigarettes. He has a 0.75 pack-year smoking history. He has never used smokeless tobacco. He reports that he does not drink alcohol and does not use drugs.   Family History:  The patient's family history includes Heart attack in his father.    ROS:  Please see the history of present illness.   Otherwise, review of systems are positive for intentional weight loss.   All other systems are reviewed and negative.    PHYSICAL EXAM: VS:  BP 138/76   Pulse (!) 58   Ht 5\' 8"  (1.727 m)   Wt 166 lb 9.6 oz (75.6 kg)   SpO2 99%   BMI 25.33 kg/m  , BMI Body mass index is 25.33 kg/m. GEN: Well nourished, well developed, in no acute distress HEENT: normal Neck: no JVD, carotid bruits, or masses Cardiac: RRR; no murmurs, rubs, or gallops,no edema  Respiratory:  clear to auscultation bilaterally, normal work of breathing GI: soft, nontender, nondistended, + BS MS: no deformity or atrophy Skin: warm and dry, no rash Neuro:  Strength and sensation are intact Psych: euthymic  mood, full affect   EKG:   The ekg ordered today demonstrates NSR, inf Q waves, unchanged   Recent Labs: No results found for requested labs within last 365 days.   Lipid Panel    Component Value Date/Time   CHOL 94 (L) 02/20/2022 0928   TRIG 55 02/20/2022 0928   HDL 28 (L) 02/20/2022 0928   CHOLHDL 3.4 02/20/2022 0928   CHOLHDL 4.2 02/28/2017 1528   VLDL 21 02/28/2017 1528   LDLCALC 53 02/20/2022 0928     Other studies Reviewed: Additional studies/ records that were reviewed today with results demonstrating: labs reviewed.   ASSESSMENT AND PLAN:  CAD: No angina.  Continue aggressive secondary prevention.  COntinue to stay  active.  No falls.  HTN: The current medical regimen is effective;  continue present plan and medications. DM: now off of metformin and A1c 5.5 after weight loss.  Recheck A1c off of metformin. CRI: Creatinine 2.98 in April 2024.Stay well-hydrated.  Avoid nephrotoxins.  If kidney function worsens, would have to consider stopping spironolactone Hyperlipidemia: LDL 62 in July 2023.  Continue rosuvastatin.  Whole food plant-based diet.  High-fiber diet.  Avoid processed foods.  Check cholesterol and liver test today. Elevated LFTs: noted in the past.  LFTs normal in June 2023.   Current medicines are reviewed at length with the patient today.  The patient concerns regarding his medicines were addressed.  The following changes have been made:  No change  Labs/ tests ordered today include: CMet, lipids  Orders Placed This Encounter  Procedures   EKG 12-Lead    Recommend 150 minutes/week of aerobic exercise Low fat, low carb, high fiber diet recommended  Disposition:   FU in 1 year   Signed, Lance Muss, MD  03/07/2023 9:30 AM    [facility] [address] [place], [place], [place]  [address] Phone: [phone]; Fax: [phone]

## 2023-03-07 ENCOUNTER — Encounter: Payer: Self-pay | Admitting: Interventional Cardiology

## 2023-03-07 ENCOUNTER — Ambulatory Visit: Payer: Medicare Other | Attending: Interventional Cardiology | Admitting: Interventional Cardiology

## 2023-03-07 VITALS — BP 138/76 | HR 58 | Ht 68.0 in | Wt 166.6 lb

## 2023-03-07 DIAGNOSIS — I10 Essential (primary) hypertension: Secondary | ICD-10-CM | POA: Diagnosis not present

## 2023-03-07 DIAGNOSIS — R7989 Other specified abnormal findings of blood chemistry: Secondary | ICD-10-CM

## 2023-03-07 DIAGNOSIS — N183 Chronic kidney disease, stage 3 unspecified: Secondary | ICD-10-CM | POA: Diagnosis not present

## 2023-03-07 DIAGNOSIS — E1159 Type 2 diabetes mellitus with other circulatory complications: Secondary | ICD-10-CM | POA: Diagnosis not present

## 2023-03-07 DIAGNOSIS — I25118 Atherosclerotic heart disease of native coronary artery with other forms of angina pectoris: Secondary | ICD-10-CM

## 2023-03-07 DIAGNOSIS — E782 Mixed hyperlipidemia: Secondary | ICD-10-CM

## 2023-03-07 MED ORDER — ROSUVASTATIN CALCIUM 40 MG PO TABS: 40.0000 mg | ORAL_TABLET | Freq: Every day | ORAL | 3 refills | Status: AC

## 2023-03-07 NOTE — Patient Instructions (Signed)
Medication Instructions:  Your physician recommends that you continue on your current medications as directed. Please refer to the Current Medication list given to you today.  *If you need a refill on your cardiac medications before your next appointment, please call your pharmacy*   Lab Work: Lab work to be done today--CMET, Lipids, A1c If you have labs (blood work) drawn today and your tests are completely normal, you will receive your results only by: MyChart Message (if you have MyChart) OR A paper copy in the mail If you have any lab test that is abnormal or we need to change your treatment, we will call you to review the results.   Testing/Procedures: none   Follow-Up: At [facility], you and your health needs are our priority.  As part of our continuing mission to provide you with exceptional heart care, we have created designated Provider Care Teams.  These Care Teams include your primary Cardiologist (physician) and Advanced Practice Providers (APPs -  Physician Assistants and Nurse Practitioners) who all work together to provide you with the care you need, when you need it.  We recommend signing up for the patient portal called "MyChart".  Sign up information is provided on this After Visit Summary.  MyChart is used to connect with patients for Virtual Visits (Telemedicine).  Patients are able to view lab/test results, encounter notes, upcoming appointments, etc.  Non-urgent messages can be sent to your provider as well.   To learn more about what you can do with MyChart, go to [email or web address].    Your next appointment:   12 month(s)  Provider:   Lance Muss, MD     Other Instructions

## 2023-03-08 LAB — COMPREHENSIVE METABOLIC PANEL
ALT: 16 IU/L (ref 0–44)
AST: 16 IU/L (ref 0–40)
Albumin: 4.3 g/dL (ref 3.8–4.8)
Alkaline Phosphatase: 67 IU/L (ref 44–121)
BUN/Creatinine Ratio: 18 (ref 10–24)
BUN: 43 mg/dL — ABNORMAL HIGH (ref 8–27)
Bilirubin Total: 0.3 mg/dL (ref 0.0–1.2)
CO2: 20 mmol/L (ref 20–29)
Calcium: 11.1 mg/dL — ABNORMAL HIGH (ref 8.6–10.2)
Chloride: 106 mmol/L (ref 96–106)
Creatinine, Ser: 2.42 mg/dL — ABNORMAL HIGH (ref 0.76–1.27)
Globulin, Total: 3.2 g/dL (ref 1.5–4.5)
Glucose: 123 mg/dL — ABNORMAL HIGH (ref 70–99)
Potassium: 4.8 mmol/L (ref 3.5–5.2)
Sodium: 139 mmol/L (ref 134–144)
Total Protein: 7.5 g/dL (ref 6.0–8.5)
eGFR: 27 mL/min/{1.73_m2} — ABNORMAL LOW (ref >59)

## 2023-03-08 LAB — HEMOGLOBIN A1C
Est. average glucose Bld gHb Est-mCnc: 114 mg/dL
Hgb A1c MFr Bld: 5.6 % (ref 4.8–5.6)

## 2023-03-08 LAB — LIPID PANEL
Chol/HDL Ratio: 2.9 ratio (ref 0.0–5.0)
Cholesterol, Total: 167 mg/dL (ref 100–199)
HDL: 58 mg/dL (ref >39)
LDL Chol Calc (NIH): 97 mg/dL (ref 0–99)
Triglycerides: 60 mg/dL (ref 0–149)
VLDL Cholesterol Cal: 12 mg/dL (ref 5–40)

## 2023-03-12 NOTE — Progress Notes (Signed)
OK. Lipids will be better with daily rosuvastatin

## 2023-04-24 DIAGNOSIS — N184 Chronic kidney disease, stage 4 (severe): Secondary | ICD-10-CM | POA: Diagnosis not present

## 2023-04-24 DIAGNOSIS — D631 Anemia in chronic kidney disease: Secondary | ICD-10-CM | POA: Diagnosis not present

## 2023-04-24 DIAGNOSIS — I129 Hypertensive chronic kidney disease with stage 1 through stage 4 chronic kidney disease, or unspecified chronic kidney disease: Secondary | ICD-10-CM | POA: Diagnosis not present

## 2023-04-24 DIAGNOSIS — N2581 Secondary hyperparathyroidism of renal origin: Secondary | ICD-10-CM | POA: Diagnosis not present

## 2023-05-16 DIAGNOSIS — Z0000 Encounter for general adult medical examination without abnormal findings: Secondary | ICD-10-CM | POA: Diagnosis not present

## 2023-05-16 DIAGNOSIS — I25810 Atherosclerosis of coronary artery bypass graft(s) without angina pectoris: Secondary | ICD-10-CM | POA: Diagnosis not present

## 2023-05-16 DIAGNOSIS — I10 Essential (primary) hypertension: Secondary | ICD-10-CM | POA: Diagnosis not present

## 2023-05-16 DIAGNOSIS — E1122 Type 2 diabetes mellitus with diabetic chronic kidney disease: Secondary | ICD-10-CM | POA: Diagnosis not present

## 2023-05-16 DIAGNOSIS — Z23 Encounter for immunization: Secondary | ICD-10-CM | POA: Diagnosis not present

## 2023-05-16 DIAGNOSIS — Z1331 Encounter for screening for depression: Secondary | ICD-10-CM | POA: Diagnosis not present

## 2023-05-16 DIAGNOSIS — N184 Chronic kidney disease, stage 4 (severe): Secondary | ICD-10-CM | POA: Diagnosis not present

## 2023-05-16 DIAGNOSIS — N2581 Secondary hyperparathyroidism of renal origin: Secondary | ICD-10-CM | POA: Diagnosis not present

## 2023-12-23 DIAGNOSIS — H43811 Vitreous degeneration, right eye: Secondary | ICD-10-CM | POA: Diagnosis not present

## 2023-12-23 DIAGNOSIS — H25813 Combined forms of age-related cataract, bilateral: Secondary | ICD-10-CM | POA: Diagnosis not present

## 2023-12-23 DIAGNOSIS — D3132 Benign neoplasm of left choroid: Secondary | ICD-10-CM | POA: Diagnosis not present

## 2023-12-23 DIAGNOSIS — E119 Type 2 diabetes mellitus without complications: Secondary | ICD-10-CM | POA: Diagnosis not present

## 2024-05-18 ENCOUNTER — Encounter: Payer: Self-pay | Admitting: Cardiology

## 2024-05-18 ENCOUNTER — Ambulatory Visit: Attending: Cardiology | Admitting: Cardiology

## 2024-05-18 VITALS — BP 147/78 | HR 67 | Resp 16 | Ht 68.0 in | Wt 171.0 lb

## 2024-05-18 DIAGNOSIS — I5032 Chronic diastolic (congestive) heart failure: Secondary | ICD-10-CM

## 2024-05-18 DIAGNOSIS — Z951 Presence of aortocoronary bypass graft: Secondary | ICD-10-CM | POA: Diagnosis not present

## 2024-05-18 DIAGNOSIS — Z8673 Personal history of transient ischemic attack (TIA), and cerebral infarction without residual deficits: Secondary | ICD-10-CM

## 2024-05-18 DIAGNOSIS — N184 Chronic kidney disease, stage 4 (severe): Secondary | ICD-10-CM

## 2024-05-18 DIAGNOSIS — I2510 Atherosclerotic heart disease of native coronary artery without angina pectoris: Secondary | ICD-10-CM

## 2024-05-18 DIAGNOSIS — I130 Hypertensive heart and chronic kidney disease with heart failure and stage 1 through stage 4 chronic kidney disease, or unspecified chronic kidney disease: Secondary | ICD-10-CM

## 2024-05-18 DIAGNOSIS — Z955 Presence of coronary angioplasty implant and graft: Secondary | ICD-10-CM

## 2024-05-18 DIAGNOSIS — E119 Type 2 diabetes mellitus without complications: Secondary | ICD-10-CM

## 2024-05-18 NOTE — Patient Instructions (Signed)
 Medication Instructions:  Your physician recommends that you continue on your current medications as directed. Please refer to the Current Medication list given to you today.  *If you need a refill on your cardiac medications before your next appointment, please call your pharmacy*  Lab Work: Please have your PCP send us  a copy of your most recent labwork. Our fax number is [phone].  If you have labs (blood work) drawn today and your tests are completely normal, you will receive your results only by: MyChart Message (if you have MyChart) OR A paper copy in the mail If you have any lab test that is abnormal or we need to change your treatment, we will call you to review the results.  Testing/Procedures: Your physician has requested that you have an echocardiogram. Echocardiography is a painless test that uses sound waves to create images of your heart. It provides your doctor with information about the size and shape of your heart and how well your heart's chambers and valves are working. This procedure takes approximately one hour. There are no restrictions for this procedure. Please do NOT wear cologne, perfume, aftershave, or lotions (deodorant is allowed). Please arrive 15 minutes prior to your appointment time.  Please note: We ask at that you not bring children with you during ultrasound (echo/ vascular) testing. Due to room size and safety concerns, children are not allowed in the ultrasound rooms during exams. Our front office staff cannot provide observation of children in our lobby area while testing is being conducted. An adult accompanying a patient to their appointment will only be allowed in the ultrasound room at the discretion of the ultrasound technician under special circumstances. We apologize for any inconvenience.   Follow-Up: At [facility], you and your health needs are our priority.  As part of our continuing mission to provide you with exceptional heart  care, our providers are all part of one team.  This team includes your primary Cardiologist (physician) and Advanced Practice Providers or APPs (Physician Assistants and Nurse Practitioners) who all work together to provide you with the care you need, when you need it.  Your next appointment:   1 year(s)  Provider:   Dr. Madonna Large

## 2024-05-18 NOTE — Progress Notes (Signed)
 Cardiology Office Note:  .   Date:  05/18/2024  ID:  [name], DOB 1946-06-04, MRN [id number] PCP:  Elliot Charm, MD  Former Cardiology Providers: Dr. Candyce Reek [facility] Providers Cardiologist:  Candyce Reek, MD , [facility] (established care 05/18/24) Electrophysiologist:  None  Click to update primary MD,subspecialty MD or APP then REFRESH:1}    Chief Complaint  Patient presents with   Coronary artery disease involving native coronary artery of   Follow-up    History of Present Illness: .   [name] is a 78 y.o. African-American male whose past medical history and cardiovascular risk factors includes: History of NSTEMI, coronary disease with history of CABG 2009 (LIMA to LAD, SVG to OM, SVG to diagonal, SVG to RCA), history of stroke, hypertension with chronic kidney disease, hx oc diabetes mellitus type 2.  Formally under the care of Dr. Candyce Reek who last saw [name] back in June 2024. I am seeing him for the first time to re-establishing care.   During his last left heart catheterization in 2013 based on EMR patient underwent successful PTCA Atherectomy of the RPDA 90% lesion reducing it to ~30% using an Angiosculpt Atherectomy balloon. Successful, complex PCI on the SVG-RCA with 3 overlapping Promus Element DES, post-dilated to ~4.0 mm.    Since last office visit patient denies any anginal chest pain or heart failure symptoms.  Overall functional capacity is limited as he ambulates with a cane but relatively stable.  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  He does not check his blood pressures at home.  He appointment to see his PCP tomorrow to get annual blood work.  Sees nephrology given his underlying CKD.  He carries a history of diabetes mellitus type 2 but based on the medication list is not on antiglycemic agents or insulin .   Review of Systems: .   Review  of Systems  Cardiovascular:  Negative for chest pain, claudication, irregular heartbeat, leg swelling, near-syncope, orthopnea, palpitations, paroxysmal nocturnal dyspnea and syncope.  Respiratory:  Negative for shortness of breath.   Hematologic/Lymphatic: Negative for bleeding problem.    Studies Reviewed:   EKG: EKG Interpretation Date/Time:  Monday May 18 2024 10:47:53 EDT Ventricular Rate:  62 PR Interval:  176 QRS Duration:  94 QT Interval:  386 QTC Calculation: 391 R Axis:   157  Text Interpretation: Normal sinus rhythm Incomplete right bundle branch block Right ventricular hypertrophy Consider Inferior infarct (cited on or before 21-Jul-2016) When compared with ECG of 07-Mar-2023 09:27, Nonspecific T wave abnormality no longer evident in Lateral leads Confirmed by [name] [phone]) on 05/18/2024 10:56:40 AM  Echocardiogram: 03/10/2022: LVEF 30-35%  05/2012: LVEF 45-50%, mild inferior hypokinesis  Stress Testing: December 2014: No reversible ischemia, inferior wall infarct.  See report for additional details  RADIOLOGY: N/A  Risk Assessment/Calculations:   NA   Labs:       Latest Ref Rng & Units 02/28/2017    3:28 PM 07/21/2016    2:32 PM 05/02/2012    7:38 AM  CBC  WBC 3.8 - 10.8 K/uL 5.6  6.3  8.5   Hemoglobin 13.0 - 17.0 g/dL 85.6  86.3  84.8   Hematocrit 38.5 - 50.0 % 43.0  40.9  43.7   Platelets 140 - 400 K/uL 182  171  172        Latest Ref Rng & Units 03/07/2023   10:24 AM  11/02/2020    8:37 AM 05/02/2017   11:19 AM  BMP  Glucose 70 - 99 mg/dL 876  856  842   BUN 8 - 27 mg/dL 43  19  17   Creatinine 0.76 - 1.27 mg/dL 7.57  8.42  8.58   BUN/Creat Ratio 10 - 24 18  12     Sodium 134 - 144 mmol/L 139  138  140   Potassium 3.5 - 5.2 mmol/L 4.8  4.2  4.2   Chloride 96 - 106 mmol/L 106  104  104   CO2 20 - 29 mmol/L 20  21  18    Calcium  8.6 - 10.2 mg/dL 88.8  89.3  89.8       Latest Ref Rng & Units 03/07/2023   10:24 AM 02/20/2022    9:28 AM  12/25/2021    9:50 AM  CMP  Glucose 70 - 99 mg/dL 876     BUN 8 - 27 mg/dL 43     Creatinine 9.23 - 1.27 mg/dL 7.57     Sodium 865 - 855 mmol/L 139     Potassium 3.5 - 5.2 mmol/L 4.8     Chloride 96 - 106 mmol/L 106     CO2 20 - 29 mmol/L 20     Calcium  8.6 - 10.2 mg/dL 88.8     Total Protein 6.0 - 8.5 g/dL 7.5  7.1  6.9   Total Bilirubin 0.0 - 1.2 mg/dL 0.3  0.3  0.6   Alkaline Phos 44 - 121 IU/L 67  69  76   AST 0 - 40 IU/L 16  18  188   ALT 0 - 44 IU/L 16  16  222     Lab Results  Component Value Date   CHOL 167 03/07/2023   HDL 58 03/07/2023   LDLCALC 97 03/07/2023   TRIG 60 03/07/2023   CHOLHDL 2.9 03/07/2023   No results for input(s): LIPOA in the last 8760 hours. No components found for: NTPROBNP No results for input(s): PROBNP in the last 8760 hours. No results for input(s): TSH in the last 8760 hours.  Physical Exam:    Today's Vitals   05/18/24 1037  BP: (!) 147/78  Pulse: 67  Resp: 16  SpO2: 97%  Weight: 171 lb (77.6 kg)  Height: 5' 8 (1.727 m)   Body mass index is 26 kg/m. Wt Readings from Last 3 Encounters:  05/18/24 171 lb (77.6 kg)  03/07/23 166 lb 9.6 oz (75.6 kg)  09/26/21 179 lb (81.2 kg)    Physical Exam  Constitutional: No distress.  hemodynamically stable,walks with cane.   Neck: No JVD present.  Cardiovascular: Normal rate, regular rhythm, S1 normal and S2 normal. Exam reveals no gallop, no S3 and no S4.  No murmur heard. Pulmonary/Chest: Effort normal and breath sounds normal. No stridor. He has no wheezes. He has no rales.  Musculoskeletal:        General: No edema.     Cervical back: Neck supple.  Skin: Skin is warm.     Impression & Recommendation(s):  Impression:   ICD-10-CM   1. Coronary artery disease involving native coronary artery of native heart without angina pectoris  I25.10 EKG 12-Lead    ECHOCARDIOGRAM COMPLETE    2. Hx of CABG  Z95.1     3. History of coronary angioplasty with insertion of stent   Z95.5     4. History of ischemic stroke  Z86.73     5. 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 Heart failure with improved ejection fraction (HFimpEF) (HCC)  I50.32     7. Non-insulin  dependent type 2 diabetes mellitus (HCC)  E11.9        Recommendation(s):  Coronary artery disease involving native coronary artery of native heart without angina pectoris Hx of CABG History of coronary angioplasty with insertion of stent Denies anginal chest pain. EKG is nonischemic. Last echocardiogram from 2013 reviewed. Last stress test from 2014 results reviewed as well. Plan echocardiogram as the last study was more than 10 years ago. No indication for routine stress test and asymptomatic male Continue dual antiplatelet therapy. Continue Crestor  40 mg p.o. daily. Continue carvedilol  25 mg p.o. daily. Continue hydralazine  25 mg p.o. 3 times daily. Continue losartan  100 mg p.o. daily. Continue spironolactone  25 mg p.o. daily. Patient is advised to start checking his blood pressures at home to see if further medication titration is warranted.  Would be ideal candidate for positioning from hydralazine  to BiDil or based on his heart rate/pulse titrating carvedilol .  Further recommendations to follow. Patient will have fasting lipids done with PCP tomorrow, advised him to send us  a copy.  Recommend a goal LDL <55 mg/dL.  Based on those labs may consider initiation of Zetia  or Nexlizet.  History of ischemic stroke States that he has residual right-sided weakness for which he uses a cane. Reemphasized the importance of secondary prevention with focus on improving the modifiable cardiovascular risk factors such as glycemic control, lipid management, blood pressure control, weight loss.  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 Follows with  [facility], Dr. Tobie Avoid nephrotoxic agents. Currently on ARB, MRA  Heart failure with improved ejection fraction (HFimpEF) (HCC) Prior LVEF noted to be 30-35%. Most recent echocardiogram from 2013 notes an LVEF of 40 to 45%. Stage B, NYHA class I/II Currently on ARB, MRA, carvedilol , hydralazine . Not on diuretics at this time, he is euvolemic on physical examination and given his CKD we will avoid for now.  Will defer volume management to nephrology.  Non-insulin  dependent type 2 diabetes mellitus (HCC) Carries a history of diabetes mellitus type 2. Currently not on antiglycemic agents for insulin .  Will defer management to primary team. His last hemoglobin A1c per [facility] database is 5.8 as of September 2024.   Orders Placed:  Orders Placed This Encounter  Procedures   EKG 12-Lead   ECHOCARDIOGRAM COMPLETE    Standing Status:   Future    Expiration Date:   05/18/2025    [facility] this [facility] performed[facility]    Does the patient weigh less than or greater than 250 lbs?:   Patient weighs less than 250 lbs    Perflutren  DEFINITY  (image enhancing agent) should be administered unless hypersensitivity or allergy exist:   Administer Perflutren     Reason for exam-Echo:   CAD Native Vessel  I25.10     Final Medication List:   No orders of the defined types were placed in this encounter.   There are no discontinued medications.   Current Outpatient Medications:    acetaminophen  (TYLENOL ) 325 MG tablet, Take 650 mg by mouth every 4 (four) hours as needed. For pain/headache, Disp: , Rfl:    amLODipine  (NORVASC ) 10 MG tablet, Take  10 mg by mouth daily., Disp: , Rfl:    aspirin  EC 81 MG tablet, Take 81 mg by mouth daily., Disp: , Rfl:    carvedilol  (COREG ) 25 MG tablet, Take 25 mg by mouth daily. , Disp: , Rfl: 0   clopidogrel  (PLAVIX ) 75 MG tablet, Take 75 mg by mouth daily with breakfast., Disp: , Rfl:    hydrALAZINE  (APRESOLINE ) 25 MG tablet, Take 1 tablet (25  mg total) by mouth 3 (three) times daily., Disp: 90 tablet, Rfl: 11   losartan  (COZAAR ) 100 MG tablet, TAKE 1 TABLET BY MOUTH ONCE DAILY, Disp: 30 tablet, Rfl: 3   nitroGLYCERIN  (NITROSTAT ) 0.4 MG SL tablet, Place 1 tablet (0.4 mg total) under the tongue every 5 (five) minutes as needed. For chest pain, Disp: 25 tablet, Rfl: 3   rosuvastatin  (CRESTOR ) 40 MG tablet, Take 1 tablet (40 mg total) by mouth daily., Disp: 90 tablet, Rfl: 3   spironolactone  (ALDACTONE ) 25 MG tablet, Take 1 tablet (25 mg total) by mouth daily., Disp: 30 tablet, Rfl: 1  Consent:   NA  Disposition:   1 year follow-up sooner if needed  His questions and concerns were addressed to his satisfaction. He voices understanding of the recommendations provided during this encounter.    Signed, Madonna Kyle HAS, [facility]  A Division of [facility] [address] [place], [place] [address]  05/18/2024 11:35 AM

## 2024-05-19 DIAGNOSIS — Z1331 Encounter for screening for depression: Secondary | ICD-10-CM | POA: Diagnosis not present

## 2024-05-19 DIAGNOSIS — E1122 Type 2 diabetes mellitus with diabetic chronic kidney disease: Secondary | ICD-10-CM | POA: Diagnosis not present

## 2024-05-19 DIAGNOSIS — I25810 Atherosclerosis of coronary artery bypass graft(s) without angina pectoris: Secondary | ICD-10-CM | POA: Diagnosis not present

## 2024-05-19 DIAGNOSIS — Z23 Encounter for immunization: Secondary | ICD-10-CM | POA: Diagnosis not present

## 2024-05-19 DIAGNOSIS — Z0000 Encounter for general adult medical examination without abnormal findings: Secondary | ICD-10-CM | POA: Diagnosis not present

## 2024-05-19 DIAGNOSIS — N184 Chronic kidney disease, stage 4 (severe): Secondary | ICD-10-CM | POA: Diagnosis not present

## 2024-05-19 DIAGNOSIS — Z8673 Personal history of transient ischemic attack (TIA), and cerebral infarction without residual deficits: Secondary | ICD-10-CM | POA: Diagnosis not present

## 2024-05-19 LAB — LAB REPORT - SCANNED
A1c: 5.9
Albumin, Urine POC: 8.91
Albumin/Creatinine Ratio, Urine, POC: 60.8
Creatinine, POC: 147 mg/dL
EGFR: 25

## 2024-06-16 ENCOUNTER — Ambulatory Visit (HOSPITAL_COMMUNITY)
Admission: RE | Admit: 2024-06-16 | Discharge: 2024-06-16 | Disposition: A | Discharge status: Home / Self Care | Source: Ambulatory Visit | Attending: Cardiology | Admitting: Cardiology

## 2024-06-16 DIAGNOSIS — I2510 Atherosclerotic heart disease of native coronary artery without angina pectoris: Secondary | ICD-10-CM

## 2024-06-16 LAB — ECHOCARDIOGRAM COMPLETE
AR max vel: 2.39 cm2
AV Area VTI: 2.15 cm2
AV Area mean vel: 2.26 cm2
AV Mean grad: 5 mmHg
AV Peak grad: 9.2 mmHg
Ao pk vel: 1.52 m/s
Area-P 1/2: 4.39 cm2
S' Lateral: 4.29 cm

## 2024-06-18 ENCOUNTER — Ambulatory Visit: Payer: Self-pay | Admitting: Cardiology

## 2024-06-23 ENCOUNTER — Other Ambulatory Visit: Payer: Self-pay | Admitting: *Deleted

## 2024-06-23 ENCOUNTER — Encounter: Payer: Self-pay | Admitting: Cardiology

## 2024-06-23 ENCOUNTER — Ambulatory Visit: Attending: Cardiology | Admitting: Cardiology

## 2024-06-23 VITALS — BP 120/62 | HR 66 | Resp 16 | Ht 68.0 in | Wt 168.4 lb

## 2024-06-23 DIAGNOSIS — Z8673 Personal history of transient ischemic attack (TIA), and cerebral infarction without residual deficits: Secondary | ICD-10-CM

## 2024-06-23 DIAGNOSIS — Z951 Presence of aortocoronary bypass graft: Secondary | ICD-10-CM

## 2024-06-23 DIAGNOSIS — I130 Hypertensive heart and chronic kidney disease with heart failure and stage 1 through stage 4 chronic kidney disease, or unspecified chronic kidney disease: Secondary | ICD-10-CM

## 2024-06-23 DIAGNOSIS — I5020 Unspecified systolic (congestive) heart failure: Secondary | ICD-10-CM

## 2024-06-23 DIAGNOSIS — Z955 Presence of coronary angioplasty implant and graft: Secondary | ICD-10-CM | POA: Diagnosis not present

## 2024-06-23 DIAGNOSIS — E119 Type 2 diabetes mellitus without complications: Secondary | ICD-10-CM

## 2024-06-23 DIAGNOSIS — N184 Chronic kidney disease, stage 4 (severe): Secondary | ICD-10-CM

## 2024-06-23 DIAGNOSIS — I2510 Atherosclerotic heart disease of native coronary artery without angina pectoris: Secondary | ICD-10-CM

## 2024-06-23 DIAGNOSIS — E782 Mixed hyperlipidemia: Secondary | ICD-10-CM

## 2024-06-23 DIAGNOSIS — I5022 Chronic systolic (congestive) heart failure: Secondary | ICD-10-CM | POA: Diagnosis not present

## 2024-06-23 DIAGNOSIS — I10 Essential (primary) hypertension: Secondary | ICD-10-CM

## 2024-06-23 MED ORDER — EMPAGLIFLOZIN 10 MG PO TABS: 10.0000 mg | ORAL_TABLET | Freq: Every day | ORAL | Status: DC

## 2024-06-23 MED ORDER — EMPAGLIFLOZIN 10 MG PO TABS: 10.0000 mg | ORAL_TABLET | Freq: Every day | ORAL | 6 refills | Status: DC

## 2024-06-23 NOTE — Patient Instructions (Addendum)
 Medication Instructions:  Your physician has recommended you make the following change in your medication:  Stop amlodipine  Start Jardiance 10 mg by mouth daily in the morning before breakfast  *If you need a refill on your cardiac medications before your next appointment, please call your pharmacy*  Lab Work: Have lab work done in one week.  It is not fasting.  BMP and BNP.  Can be done at any [facility] location. There is a [employer] on the first floor of our building If you have labs (blood work) drawn today and your tests are completely normal, you will receive your results only by: MyChart Message (if you have MyChart) OR A paper copy in the mail If you have any lab test that is abnormal or we need to change your treatment, we will call you to review the results.  Testing/Procedures: none  Follow-Up: At [facility], you and your health needs are our priority.  As part of our continuing mission to provide you with exceptional heart care, our providers are all part of one team.  This team includes your primary Cardiologist (physician) and Advanced Practice Providers or APPs (Physician Assistants and Nurse Practitioners) who all work together to provide you with the care you need, when you need it.  Your next appointment:   September 23, 2024 at 10 AM  Provider:   Dr Michele   We recommend signing up for the patient portal called MyChart.  Sign up information is provided on this After Visit Summary.  MyChart is used to connect with patients for Virtual Visits (Telemedicine).  Patients are able to view lab/test results, encounter notes, upcoming appointments, etc.  Non-urgent messages can be sent to your provider as well.   To learn more about what you can do with MyChart, go to [email or web address].   Other Instructions

## 2024-06-23 NOTE — Progress Notes (Signed)
 Cardiology Office Note:  .   Date:  06/23/2024  ID:  [name] Amy, DOB 09-12-1945, MRN [id number] PCP:  Kyle Charm, MD  Former Cardiology Providers: Dr. Candyce Reek [facility] Providers Cardiologist:  Madonna Large, DO , [facility] (established care 05/18/24) Electrophysiologist:  None  Click to update primary MD,subspecialty MD or APP then REFRESH:1}    Chief Complaint  Patient presents with   Results   Follow-up    History of Present Illness: .   [name] is a 78 y.o. African-American male whose past medical history and cardiovascular risk factors includes: History of NSTEMI, coronary disease with history of CABG 2009 (LIMA to LAD, SVG to OM, SVG to diagonal, SVG to RCA), history of stroke, hypertension with chronic kidney disease, hx oc diabetes mellitus type 2.  During his last left heart catheterization in 2013 based on EMR patient underwent successful PTCA Atherectomy of the RPDA 90% lesion reducing it to ~30% using an Angiosculpt Atherectomy balloon. Successful, complex PCI on the SVG-RCA with 3 overlapping Promus Element DES, post-dilated to ~4.0 mm.    Patient denies any anginal chest pain or heart failure symptoms.  Overall functional capacity is limited as he ambulates with a cane but relatively stable.  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 Outside labs reviewed and noted below. He has CKD stage IV and and follow with Dr. Tobie at [facility].   Since last office visit patient had an echocardiogram which notes moderately reduced LVEF and therefore was brought into the office to discuss uptitration of GDMT.   Review of Systems: .   Review of Systems  Cardiovascular:  Negative for chest pain, claudication, irregular heartbeat, leg swelling, near-syncope, orthopnea, palpitations, paroxysmal nocturnal dyspnea and syncope.  Respiratory:  Negative for shortness of breath.    Hematologic/Lymphatic: Negative for bleeding problem.    Studies Reviewed:   Echocardiogram: 03/10/2022: LVEF 30-35%  05/2012: LVEF 45-50%, mild inferior hypokinesis  06/16/2024  1. Left ventricular ejection fraction, by estimation, is 40 to 45%. The left ventricle has mildly decreased function. The left ventricle demonstrates regional wall motion abnormalities (see scoring diagram/findings for description). Left ventricular diastolic parameters are indeterminate. The average left ventricular  global longitudinal strain is -16.9 %. The global longitudinal strain is abnormal.   2. Right ventricular systolic function is normal. The right ventricular size is normal. There is normal pulmonary artery systolic pressure. The estimated right ventricular systolic pressure is 35.2 mmHg.   3. The mitral valve is normal in structure. Trivial mitral valve  regurgitation. No evidence of mitral stenosis.   4. The aortic valve is bicuspid. Aortic valve regurgitation is not visualized. Aortic valve sclerosis/calcification is present, without any evidence of aortic stenosis.   5. The inferior vena cava is dilated in size with >50% respiratory  variability, suggesting right atrial pressure of 8 mmHg.   Stress Testing: December 2014: No reversible ischemia, inferior wall infarct.  See report for additional details  RADIOLOGY: N/A  Risk Assessment/Calculations:   NA   Labs:       Latest Ref Rng & Units 02/28/2017    3:28 PM 07/21/2016    2:32 PM 05/02/2012    7:38 AM  CBC  WBC 3.8 - 10.8 K/uL 5.6  6.3  8.5   Hemoglobin 13.0 - 17.0 g/dL 85.6  86.3  84.8   Hematocrit 38.5 - 50.0 % 43.0  40.9  43.7   Platelets  140 - 400 K/uL 182  171  172        Latest Ref Rng & Units 03/07/2023   10:24 AM 11/02/2020    8:37 AM 05/02/2017   11:19 AM  BMP  Glucose 70 - 99 mg/dL 876  856  842   BUN 8 - 27 mg/dL 43  19  17   Creatinine 0.76 - 1.27 mg/dL 7.57  8.42  8.58   BUN/Creat Ratio 10 - 24 18  12     Sodium  134 - 144 mmol/L 139  138  140   Potassium 3.5 - 5.2 mmol/L 4.8  4.2  4.2   Chloride 96 - 106 mmol/L 106  104  104   CO2 20 - 29 mmol/L 20  21  18    Calcium  8.6 - 10.2 mg/dL 88.8  89.3  89.8       Latest Ref Rng & Units 03/07/2023   10:24 AM 02/20/2022    9:28 AM 12/25/2021    9:50 AM  CMP  Glucose 70 - 99 mg/dL 876     BUN 8 - 27 mg/dL 43     Creatinine 9.23 - 1.27 mg/dL 7.57     Sodium 865 - 855 mmol/L 139     Potassium 3.5 - 5.2 mmol/L 4.8     Chloride 96 - 106 mmol/L 106     CO2 20 - 29 mmol/L 20     Calcium  8.6 - 10.2 mg/dL 88.8     Total Protein 6.0 - 8.5 g/dL 7.5  7.1  6.9   Total Bilirubin 0.0 - 1.2 mg/dL 0.3  0.3  0.6   Alkaline Phos 44 - 121 IU/L 67  69  76   AST 0 - 40 IU/L 16  18  188   ALT 0 - 44 IU/L 16  16  222     Lab Results  Component Value Date   CHOL 167 03/07/2023   HDL 58 03/07/2023   LDLCALC 97 03/07/2023   TRIG 60 03/07/2023   CHOLHDL 2.9 03/07/2023   No results for input(s): LIPOA in the last 8760 hours. No components found for: NTPROBNP No results for input(s): PROBNP in the last 8760 hours. No results for input(s): TSH in the last 8760 hours.  External Labs: Collected: 05/19/2024 Provided by PCP A1c 5.9% Hb 13.7g/dL BUN 34, Cr 7.43  eGFR 25 Sodium 139, Potassium 4.6, Cl 106, Co2 26 AST,ALT,AlK phos normal limits  Total cholesterol 138, TAG 56, HDL 45, LCLc 81, NHDL 93  Physical Exam:    Today's Vitals   06/23/24 1013  BP: 120/62  Pulse: 66  Resp: 16  SpO2: 98%  Weight: 168 lb 6.4 oz (76.4 kg)  Height: 5' 8 (1.727 m)   Body mass index is 25.61 kg/m. Wt Readings from Last 3 Encounters:  06/23/24 168 lb 6.4 oz (76.4 kg)  05/18/24 171 lb (77.6 kg)  03/07/23 166 lb 9.6 oz (75.6 kg)    Physical Exam  Constitutional: No distress.  hemodynamically stable,walks with cane.   Neck: No JVD present.  Cardiovascular: Normal rate, regular rhythm, S1 normal and S2 normal. Exam reveals no gallop, no S3 and no S4.  No murmur  heard. Pulmonary/Chest: Effort normal and breath sounds normal. No stridor. He has no wheezes. He has no rales.  Musculoskeletal:        General: No edema.     Cervical back: Neck supple.  Skin: Skin is warm.     Impression &  Recommendation(s):  Impression:   ICD-10-CM   1. Chronic heart failure with mildly reduced ejection fraction (HFmrEF) (HCC)  I50.22 Basic Metabolic Panel (BMET)    Pro b natriuretic peptide    empagliflozin (JARDIANCE) 10 MG TABS tablet    DISCONTINUED: empagliflozin (JARDIANCE) 10 MG TABS tablet    2. Coronary artery disease involving native coronary artery of native heart without angina pectoris  I25.10 Basic Metabolic Panel (BMET)    Pro b natriuretic peptide    empagliflozin (JARDIANCE) 10 MG TABS tablet    3. Hx of CABG  Z95.1     4. History of coronary angioplasty with insertion of stent  Z95.5     5. History of ischemic stroke  Z86.73     6. 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 Non-insulin  dependent type 2 diabetes mellitus (HCC)  E11.9     8. Mixed hyperlipidemia  E78.2         Recommendation(s):  Chronic HFmrEF NYHA class I/II Prior LVEF noted to be 30-35%. Echo 2013 notes an LVEF of 40 to 45%. Echo 06/2024: LVEF 40-45%, RWMA, see report  Continue carvedilol  25 mg p.o. daily. Continue hydralazine  25 mg p.o. 3 times daily. Continue losartan  100 mg p.o. daily. Continue spironolactone  25 mg p.o. daily. D/C Norvasc   Start Jardiance 10mg  po qday  Check BMP and BNP in one week   Coronary artery disease involving native coronary artery of native heart without angina pectoris Hx of CABG History of coronary angioplasty with insertion of stent Denies anginal chest pain. Prior EKG is nonischemic. Last echocardiogram from 06/2024 reviewed. Last stress test from 2014 results reviewed as well. No indication for routine stress test in an asymptomatic  male Continue dual antiplatelet therapy. Continue Crestor  40 mg p.o. daily. Lipids acceptable but not at goal will discuss adding Zetia  at the next visit.  Recommend a goal LDL <55 mg/dL.    History of ischemic stroke States that he has residual right-sided weakness for which he uses a cane. Reemphasized the importance of secondary prevention with focus on improving the modifiable cardiovascular risk factors such as glycemic control, lipid management, blood pressure control, weight loss.  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 Follows with [facility], Dr. Tobie Avoid nephrotoxic agents. Currently on ARB, MRA.  Adding SGLTi for HFmrEF with close check on renal function  Non-insulin  dependent type 2 diabetes mellitus (HCC) Carries a history of diabetes mellitus type 2 Currently not on antiglycemic agents for insulin .  Will defer management to primary team. His last hemoglobin A1c per [facility] database is 5.9 as of September 2025.   Orders Placed:  Orders Placed This Encounter  Procedures   Basic Metabolic Panel (BMET)    Standing Status:   Future    Number of Occurrences:   1    Expected Date:   06/30/2024    Expiration Date:   06/23/2025   Pro b natriuretic peptide    Standing Status:   Future    Number of Occurrences:   1    Expected Date:   06/30/2024    Expiration Date:   06/23/2025     Final Medication List:    Meds ordered this encounter  Medications   DISCONTD: empagliflozin (JARDIANCE) 10 MG TABS tablet  Sig: Take 1 tablet (10 mg total) by mouth daily before breakfast.    Dispense:  30 tablet    Refill:  6    Patient to stop amlodipine    empagliflozin (JARDIANCE) 10 MG TABS tablet    Sig: Take 1 tablet (10 mg total) by mouth daily before breakfast.    Patient to stop amlodipine     Lot Number?:   [id number]    Expiration Date?:   12/08/2025    Quantity:   14     Medications Discontinued During This Encounter  Medication Reason   amLODipine  (NORVASC ) 10 MG tablet    empagliflozin (JARDIANCE) 10 MG TABS tablet Reorder     Current Outpatient Medications:    acetaminophen  (TYLENOL ) 325 MG tablet, Take 650 mg by mouth every 4 (four) hours as needed. For pain/headache, Disp: , Rfl:    aspirin  EC 81 MG tablet, Take 81 mg by mouth daily., Disp: , Rfl:    carvedilol  (COREG ) 25 MG tablet, Take 25 mg by mouth daily. , Disp: , Rfl: 0   clopidogrel  (PLAVIX ) 75 MG tablet, Take 75 mg by mouth daily with breakfast., Disp: , Rfl:    hydrALAZINE  (APRESOLINE ) 25 MG tablet, Take 1 tablet (25 mg total) by mouth 3 (three) times daily., Disp: 90 tablet, Rfl: 11   losartan  (COZAAR ) 100 MG tablet, TAKE 1 TABLET BY MOUTH ONCE DAILY, Disp: 30 tablet, Rfl: 3   nitroGLYCERIN  (NITROSTAT ) 0.4 MG SL tablet, Place 1 tablet (0.4 mg total) under the tongue every 5 (five) minutes as needed. For chest pain, Disp: 25 tablet, Rfl: 3   rosuvastatin  (CRESTOR ) 40 MG tablet, Take 1 tablet (40 mg total) by mouth daily., Disp: 90 tablet, Rfl: 3   spironolactone  (ALDACTONE ) 25 MG tablet, Take 1 tablet (25 mg total) by mouth daily., Disp: 30 tablet, Rfl: 1   empagliflozin (JARDIANCE) 10 MG TABS tablet, Take 1 tablet (10 mg total) by mouth daily before breakfast., Disp: , Rfl:   Consent:   NA  Disposition:   3 month  follow-up sooner if needed  His questions and concerns were addressed to his satisfaction. He voices understanding of the recommendations provided during this encounter.    Signed, Madonna Michele HAS, [facility]  A Division of [facility] [address] [place], [place] [address]  06/23/2024 12:17 PM

## 2024-06-26 ENCOUNTER — Telehealth: Payer: Self-pay | Admitting: Cardiology

## 2024-06-26 NOTE — Telephone Encounter (Signed)
 Patient identification verified by 2 forms. Spoke with pt regarding his medications and recent medication that Dr. Michele advised pt to stop Amlodipine . Pt states he couldn't remember what he was told not to take, he just wanted to make sure to pull that medication to not take it moving forward. Pt needs nothing further. Pt verbalizes understanding.

## 2024-06-26 NOTE — Telephone Encounter (Signed)
 Pt was prescribed a medication from a different provider and thinks it may have an interaction with one of his cardiac medicines. He is not sure of either one. Please advise.

## 2024-09-23 ENCOUNTER — Ambulatory Visit: Attending: Cardiology | Admitting: Cardiology

## 2024-09-23 ENCOUNTER — Encounter: Payer: Self-pay | Admitting: Cardiology

## 2024-09-23 ENCOUNTER — Other Ambulatory Visit (HOSPITAL_COMMUNITY): Payer: Self-pay

## 2024-09-23 VITALS — BP 123/76 | HR 71 | Resp 16 | Ht 68.0 in | Wt 163.0 lb

## 2024-09-23 DIAGNOSIS — Z951 Presence of aortocoronary bypass graft: Secondary | ICD-10-CM

## 2024-09-23 DIAGNOSIS — I130 Hypertensive heart and chronic kidney disease with heart failure and stage 1 through stage 4 chronic kidney disease, or unspecified chronic kidney disease: Secondary | ICD-10-CM | POA: Diagnosis not present

## 2024-09-23 DIAGNOSIS — I5022 Chronic systolic (congestive) heart failure: Secondary | ICD-10-CM

## 2024-09-23 DIAGNOSIS — N184 Chronic kidney disease, stage 4 (severe): Secondary | ICD-10-CM

## 2024-09-23 DIAGNOSIS — Z955 Presence of coronary angioplasty implant and graft: Secondary | ICD-10-CM

## 2024-09-23 DIAGNOSIS — I2510 Atherosclerotic heart disease of native coronary artery without angina pectoris: Secondary | ICD-10-CM

## 2024-09-23 DIAGNOSIS — E119 Type 2 diabetes mellitus without complications: Secondary | ICD-10-CM

## 2024-09-23 DIAGNOSIS — Z8673 Personal history of transient ischemic attack (TIA), and cerebral infarction without residual deficits: Secondary | ICD-10-CM

## 2024-09-23 MED ORDER — EMPAGLIFLOZIN 10 MG PO TABS
10.0000 mg | ORAL_TABLET | Freq: Every day | ORAL | 0 refills | Status: AC
  Filled 2024-09-23: qty 30, 30d supply, fill #0

## 2024-09-23 NOTE — Patient Instructions (Signed)
 Medication Instructions:  STOP Amlodipine  (Norvasc )  RESUME taking Empagliflozin  (Jardiance ) 10 mg. Take one (1) tablet by mouth once daily.  *If you need a refill on your cardiac medications before your next appointment, please call your pharmacy*  Lab Work: BMP in one week If you have labs (blood work) drawn today and your tests are completely normal, you will receive your results only by: MyChart Message (if you have MyChart) OR A paper copy in the mail If you have any lab test that is abnormal or we need to change your treatment, we will call you to review the results.  Testing/Procedures: None ordered  Follow-Up: At [facility], you and your health needs are our priority.  As part of our continuing mission to provide you with exceptional heart care, our providers are all part of one team.  This team includes your primary Cardiologist (physician) and Advanced Practice Providers or APPs (Physician Assistants and Nurse Practitioners) who all work together to provide you with the care you need, when you need it.  Your next appointment:   6 month(s)  Provider:   Madonna Large, DO    We recommend signing up for the patient portal called MyChart.  Sign up information is provided on this After Visit Summary.  MyChart is used to connect with patients for Virtual Visits (Telemedicine).  Patients are able to view lab/test results, encounter notes, upcoming appointments, etc.  Non-urgent messages can be sent to your provider as well.   To learn more about what you can do with MyChart, go to [email or web address].

## 2024-09-23 NOTE — Progress Notes (Signed)
 " Cardiology Office Note:  .   Date:  09/23/2024  ID:  [name], DOB 1946-01-01, MRN [id number] PCP:  Elliot Charm, MD  Former Cardiology Providers: Dr. Candyce Reek [facility] Providers Cardiologist:  Madonna Large, DO , [facility] (established care 05/18/24) Electrophysiologist:  None  Click to update primary MD,subspecialty MD or APP then REFRESH:1}    Chief Complaint  Patient presents with   Chronic heart failure with mildly reduced ejection fraction    Follow-up    History of Present Illness: .   [name] is a 79 y.o. African-American male whose past medical history and cardiovascular risk factors includes: History of NSTEMI, coronary disease with history of CABG 2009 (LIMA to LAD, SVG to OM, SVG to diagonal, SVG to RCA), history of stroke, hypertension with chronic kidney disease, hx oc diabetes mellitus type 2.  Per electronic medical records, during his heart catheterization in 2013 he underwent successful PTCA Atherectomy of the RPDA 90% lesion reducing it to ~30% using an Angiosculpt Atherectomy balloon. Successful, complex PCI on the SVG-RCA with 3 overlapping Promus Element DES, post-dilated to ~4.0 mm.    At the last office visit patient noted no anginal discomfort or heart failure symptoms overall function capacity is limited but still makes an effort to go for daily walk and does yard work during summer such as weed whacking and trimming.  He has chronic kidney disease stage IV and follows with Dr. Tobie at [facility].  He had an echocardiogram in October 2025 which noted LVEF at 40-45% with regional wall motion abnormality.  He was recommended to discontinue amlodipine  and start Jardiance  10 mg p.o. daily with follow-up labs in 1 week.  We discussed undergoing stress test to evaluate for reversible ischemia given the regional wall motion abnormalities.  However, since he was asymptomatic shared decision was to hold off with  close follow-up.  Since last office visit denies anginal chest pain or heart failure symptoms.  He did well on Jardiance  10 mg p.o. daily but ran out of prescription so stopped taking it and feels that it is going to be cost prohibitive long-term.  Once he stopped Jardiance  patient restarted his original dose of amlodipine  10 mg p.o. daily.   Review of Systems: .   Review of Systems  Cardiovascular:  Negative for chest pain, claudication, irregular heartbeat, leg swelling, near-syncope, orthopnea, palpitations, paroxysmal nocturnal dyspnea and syncope.  Respiratory:  Negative for shortness of breath.   Hematologic/Lymphatic: Negative for bleeding problem.    Studies Reviewed:   Echocardiogram: 03/10/2022: LVEF 30-35%  05/2012: LVEF 45-50%, mild inferior hypokinesis  06/16/2024  1. Left ventricular ejection fraction, by estimation, is 40 to 45%. The left ventricle has mildly decreased function. The left ventricle demonstrates regional wall motion abnormalities (see scoring diagram/findings for description). Left ventricular diastolic parameters are indeterminate. The average left ventricular  global longitudinal strain is -16.9 %. The global longitudinal strain is abnormal.   2. Right ventricular systolic function is normal. The right ventricular size is normal. There is normal pulmonary artery systolic pressure. The estimated right ventricular systolic pressure is 35.2 mmHg.   3. The mitral valve is normal in structure. Trivial mitral valve  regurgitation. No evidence of mitral stenosis.   4. The aortic valve is bicuspid. Aortic valve regurgitation is not visualized. Aortic valve sclerosis/calcification is present, without any evidence of aortic stenosis.   5. The inferior vena cava is dilated in size with >50% respiratory  variability, suggesting right  atrial pressure of 8 mmHg.   Stress Testing: December 2014: No reversible ischemia, inferior wall infarct.  See report for additional  details  RADIOLOGY: N/A  Risk Assessment/Calculations:   NA   Labs:       Latest Ref Rng & Units 02/28/2017    3:28 PM 07/21/2016    2:32 PM 05/02/2012    7:38 AM  CBC  WBC 3.8 - 10.8 K/uL 5.6  6.3  8.5   Hemoglobin 13.0 - 17.0 g/dL 85.6  86.3  84.8   Hematocrit 38.5 - 50.0 % 43.0  40.9  43.7   Platelets 140 - 400 K/uL 182  171  172        Latest Ref Rng & Units 03/07/2023   10:24 AM 11/02/2020    8:37 AM 05/02/2017   11:19 AM  BMP  Glucose 70 - 99 mg/dL 876  856  842   BUN 8 - 27 mg/dL 43  19  17   Creatinine 0.76 - 1.27 mg/dL 7.57  8.42  8.58   BUN/Creat Ratio 10 - 24 18  12     Sodium 134 - 144 mmol/L 139  138  140   Potassium 3.5 - 5.2 mmol/L 4.8  4.2  4.2   Chloride 96 - 106 mmol/L 106  104  104   CO2 20 - 29 mmol/L 20  21  18    Calcium  8.6 - 10.2 mg/dL 88.8  89.3  89.8       Latest Ref Rng & Units 03/07/2023   10:24 AM 02/20/2022    9:28 AM 12/25/2021    9:50 AM  CMP  Glucose 70 - 99 mg/dL 876     BUN 8 - 27 mg/dL 43     Creatinine 9.23 - 1.27 mg/dL 7.57     Sodium 865 - 855 mmol/L 139     Potassium 3.5 - 5.2 mmol/L 4.8     Chloride 96 - 106 mmol/L 106     CO2 20 - 29 mmol/L 20     Calcium  8.6 - 10.2 mg/dL 88.8     Total Protein 6.0 - 8.5 g/dL 7.5  7.1  6.9   Total Bilirubin 0.0 - 1.2 mg/dL 0.3  0.3  0.6   Alkaline Phos 44 - 121 IU/L 67  69  76   AST 0 - 40 IU/L 16  18  188   ALT 0 - 44 IU/L 16  16  222     Lab Results  Component Value Date   CHOL 167 03/07/2023   HDL 58 03/07/2023   LDLCALC 97 03/07/2023   TRIG 60 03/07/2023   CHOLHDL 2.9 03/07/2023   No results for input(s): LIPOA in the last 8760 hours. No components found for: NTPROBNP No results for input(s): PROBNP in the last 8760 hours. No results for input(s): TSH in the last 8760 hours.  External Labs: Collected: 05/19/2024 Provided by PCP A1c 5.9% Hb 13.7g/dL BUN 34, Cr 7.43  eGFR 25 Sodium 139, Potassium 4.6, Cl 106, Co2 26 AST,ALT,AlK phos normal limits  Total  cholesterol 138, TAG 56, HDL 45, LCLc 81, NHDL 93  Physical Exam:    Today's Vitals   09/23/24 0954  BP: 123/76  Pulse: 71  Resp: 16  SpO2: 98%  Weight: 163 lb (73.9 kg)  Height: 5' 8 (1.727 m)   Body mass index is 24.78 kg/m. Wt Readings from Last 3 Encounters:  09/23/24 163 lb (73.9 kg)  06/23/24 168 lb 6.4 oz (  76.4 kg)  05/18/24 171 lb (77.6 kg)    Physical Exam  Constitutional: No distress.  hemodynamically stable,walks with cane.   Neck: No JVD present.  Cardiovascular: Normal rate, regular rhythm, S1 normal and S2 normal. Exam reveals no gallop, no S3 and no S4.  No murmur heard. Pulmonary/Chest: Effort normal and breath sounds normal. No stridor. He has no wheezes. He has no rales.  Musculoskeletal:        General: No edema.     Cervical back: Neck supple.  Skin: Skin is warm.     Impression & Recommendation(s):  Impression:   ICD-10-CM   1. Chronic heart failure with mildly reduced ejection fraction (HFmrEF) (HCC)  I50.22 Basic Metabolic Panel (BMET)    2. Coronary artery disease involving native coronary artery of native heart without angina pectoris  I25.10     3. Hx of CABG  Z95.1     4. History of coronary angioplasty with insertion of stent  Z95.5     5. History of ischemic stroke  Z86.73     6. 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 Non-insulin  dependent type 2 diabetes mellitus (HCC)  E11.9      Recommendation(s):  Chronic HFmrEF NYHA class I/II Prior LVEF noted to be 30-35%. Echo 2013 notes an LVEF of 40 to 45%. Echo 06/2024: LVEF 40-45%, RWMA, see report  Continue carvedilol  25 mg p.o. daily. Continue hydralazine  25 mg p.o. 3 times daily. Continue losartan  100 mg p.o. daily. Continue spironolactone  25 mg p.o. daily. D/C Norvasc   Start Jardiance  10mg  po qday  Check BMP and BNP in one week  Also reached out to ambulatory pharmacy clinic for patient assistance for  Jardiance   Coronary artery disease involving native coronary artery of native heart without angina pectoris Hx of CABG History of coronary angioplasty with insertion of stent Denies anginal chest pain. Prior EKG is nonischemic. Last echocardiogram from 06/2024 reviewed. We discussed considering stress test or cardiac PET/CT; however, patient participated in shared decision to hold off on additional testing as he is asymptomatic.  He is advised to seek medical attention if he has new onset of chest pain or heart failure symptoms. Continue dual antiplatelet therapy. Continue Crestor  40 mg p.o. daily. Lipids acceptable but not at goal will discuss adding Zetia  at the next visit.  Recommend a goal LDL <55 mg/dL.    History of ischemic stroke States that he has residual right-sided weakness for which he uses a cane. Reemphasized the importance of secondary prevention with focus on improving the modifiable cardiovascular risk factors such as glycemic control, lipid management, blood pressure control, weight loss.  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 Follows with [facility], Dr. Tobie Avoid nephrotoxic agents. Currently on ARB, MRA.  Adding SGLTi for HFmrEF with close check on renal function  Non-insulin  dependent type 2 diabetes mellitus (HCC) Carries a history of diabetes mellitus type 2 Currently not on antiglycemic agents for insulin .  Will defer management to primary team. His last hemoglobin A1c per [facility] database is 5.9 as of September 2025.  Orders Placed:  Orders Placed This Encounter  Procedures   Basic Metabolic Panel (BMET)    Standing Status:   Future    Expected Date:   09/30/2024    Expiration Date:  09/23/2025     Final Medication List:    Meds ordered this encounter  Medications   empagliflozin  (JARDIANCE ) 10 MG TABS tablet    Sig: Take 1 tablet (10 mg  total) by mouth daily before breakfast.    Dispense:  30 tablet    Refill:  0    Medications Discontinued During This Encounter  Medication Reason   empagliflozin  (JARDIANCE ) 10 MG TABS tablet Change in therapy      Current Outpatient Medications:    acetaminophen  (TYLENOL ) 325 MG tablet, Take 650 mg by mouth every 4 (four) hours as needed. For pain/headache, Disp: , Rfl:    amLODipine  (NORVASC ) 10 MG tablet, Take 10 mg by mouth daily., Disp: , Rfl:    aspirin  EC 81 MG tablet, Take 81 mg by mouth daily., Disp: , Rfl:    carvedilol  (COREG ) 25 MG tablet, Take 25 mg by mouth daily. , Disp: , Rfl: 0   clopidogrel  (PLAVIX ) 75 MG tablet, Take 75 mg by mouth daily with breakfast., Disp: , Rfl:    empagliflozin  (JARDIANCE ) 10 MG TABS tablet, Take 1 tablet (10 mg total) by mouth daily before breakfast., Disp: 30 tablet, Rfl: 0   hydrALAZINE  (APRESOLINE ) 25 MG tablet, Take 1 tablet (25 mg total) by mouth 3 (three) times daily., Disp: 90 tablet, Rfl: 11   losartan  (COZAAR ) 100 MG tablet, TAKE 1 TABLET BY MOUTH ONCE DAILY, Disp: 30 tablet, Rfl: 3   nitroGLYCERIN  (NITROSTAT ) 0.4 MG SL tablet, Place 1 tablet (0.4 mg total) under the tongue every 5 (five) minutes as needed. For chest pain, Disp: 25 tablet, Rfl: 3   rosuvastatin  (CRESTOR ) 40 MG tablet, Take 1 tablet (40 mg total) by mouth daily., Disp: 90 tablet, Rfl: 3   spironolactone  (ALDACTONE ) 25 MG tablet, Take 1 tablet (25 mg total) by mouth daily., Disp: 30 tablet, Rfl: 1  Consent:   NA  Disposition:   57-month follow-up sooner if needed His questions and concerns were addressed to his satisfaction. He voices understanding of the recommendations provided during this encounter.    Signed, Madonna Michele HAS, [facility]  A Division of [facility] [address], [place], [place] [address]  09/23/2024 3:19 PM "

## 2024-09-28 ENCOUNTER — Telehealth: Payer: Self-pay | Admitting: Pharmacy Technician

## 2024-09-28 ENCOUNTER — Other Ambulatory Visit (HOSPITAL_COMMUNITY): Payer: Self-pay

## 2024-09-28 NOTE — Telephone Encounter (Signed)
 Patient Advocate Encounter   The patient was approved for a [employer] grant that will help cover the cost of JARDIANCE  Total amount awarded, 7500.  Effective: 08/29/24 - 08/28/25   APW:[id number] ERW:[id number] Hmnle:[id number] PI:[id number] [employer] ID: [id number]   In wam and notified patient -sent mychart -tried to call abd just rang   He sent in [employer] app and that is in media   He has cardiomyopathy so did grant
